# Patient Record
Sex: Female | Born: 1945 | Race: White | Hispanic: No | Marital: Married | Smoking: Never smoker
Health system: Southern US, Community
[De-identification: ages and names within clinical notes are randomized; demographics above are authoritative.]

## PROBLEM LIST (undated history)

## (undated) DIAGNOSIS — M48061 Spinal stenosis, lumbar region without neurogenic claudication: Secondary | ICD-10-CM

## (undated) DIAGNOSIS — S9000XA Contusion of unspecified ankle, initial encounter: Secondary | ICD-10-CM

## (undated) DIAGNOSIS — E785 Hyperlipidemia, unspecified: Secondary | ICD-10-CM

## (undated) DIAGNOSIS — T4145XA Adverse effect of unspecified anesthetic, initial encounter: Secondary | ICD-10-CM

## (undated) DIAGNOSIS — K7689 Other specified diseases of liver: Secondary | ICD-10-CM

## (undated) DIAGNOSIS — I10 Essential (primary) hypertension: Secondary | ICD-10-CM

## (undated) DIAGNOSIS — I340 Nonrheumatic mitral (valve) insufficiency: Secondary | ICD-10-CM

## (undated) DIAGNOSIS — E669 Obesity, unspecified: Secondary | ICD-10-CM

## (undated) DIAGNOSIS — R002 Palpitations: Secondary | ICD-10-CM

## (undated) DIAGNOSIS — N281 Cyst of kidney, acquired: Secondary | ICD-10-CM

## (undated) DIAGNOSIS — Z8719 Personal history of other diseases of the digestive system: Secondary | ICD-10-CM

## (undated) DIAGNOSIS — T8859XA Other complications of anesthesia, initial encounter: Secondary | ICD-10-CM

## (undated) DIAGNOSIS — I517 Cardiomegaly: Secondary | ICD-10-CM

## (undated) DIAGNOSIS — F419 Anxiety disorder, unspecified: Secondary | ICD-10-CM

## (undated) DIAGNOSIS — Z9889 Other specified postprocedural states: Secondary | ICD-10-CM

## (undated) DIAGNOSIS — R112 Nausea with vomiting, unspecified: Secondary | ICD-10-CM

## (undated) DIAGNOSIS — K219 Gastro-esophageal reflux disease without esophagitis: Secondary | ICD-10-CM

## (undated) HISTORY — DX: Essential (primary) hypertension: I10

## (undated) HISTORY — DX: Obesity, unspecified: E66.9

## (undated) HISTORY — PX: OTHER SURGICAL HISTORY: SHX169

## (undated) HISTORY — DX: Cardiomegaly: I51.7

## (undated) HISTORY — PX: CHOLECYSTECTOMY: SHX55

## (undated) HISTORY — DX: Gastro-esophageal reflux disease without esophagitis: K21.9

## (undated) HISTORY — PX: LIPOMA EXCISION: SHX5283

## (undated) HISTORY — DX: Palpitations: R00.2

## (undated) HISTORY — DX: Anxiety disorder, unspecified: F41.9

## (undated) HISTORY — DX: Contusion of unspecified ankle, initial encounter: S90.00XA

## (undated) HISTORY — DX: Nonrheumatic mitral (valve) insufficiency: I34.0

## (undated) HISTORY — PX: MENISCUS REPAIR: SHX5179

## (undated) HISTORY — DX: Hyperlipidemia, unspecified: E78.5

## (undated) HISTORY — DX: Other specified diseases of liver: K76.89

---

## 1993-10-19 HISTORY — PX: BREAST LUMPECTOMY: SHX2

## 1998-02-11 ENCOUNTER — Other Ambulatory Visit: Admission: RE | Admit: 1998-02-11 | Discharge: 1998-02-11 | Payer: Self-pay | Admitting: Obstetrics & Gynecology

## 1999-03-13 ENCOUNTER — Other Ambulatory Visit: Admission: RE | Admit: 1999-03-13 | Discharge: 1999-03-13 | Payer: Self-pay | Admitting: Obstetrics & Gynecology

## 2000-04-07 ENCOUNTER — Other Ambulatory Visit: Admission: RE | Admit: 2000-04-07 | Discharge: 2000-04-07 | Payer: Self-pay | Admitting: Obstetrics and Gynecology

## 2000-04-14 ENCOUNTER — Encounter: Payer: Self-pay | Admitting: Obstetrics and Gynecology

## 2000-04-14 ENCOUNTER — Encounter: Admission: RE | Admit: 2000-04-14 | Discharge: 2000-04-14 | Payer: Self-pay | Admitting: Obstetrics and Gynecology

## 2001-06-29 ENCOUNTER — Encounter: Admission: RE | Admit: 2001-06-29 | Discharge: 2001-06-29 | Payer: Self-pay | Admitting: Obstetrics and Gynecology

## 2001-06-29 ENCOUNTER — Encounter: Payer: Self-pay | Admitting: Obstetrics and Gynecology

## 2001-07-04 ENCOUNTER — Encounter: Admission: RE | Admit: 2001-07-04 | Discharge: 2001-07-04 | Payer: Self-pay | Admitting: Obstetrics and Gynecology

## 2001-07-04 ENCOUNTER — Encounter: Payer: Self-pay | Admitting: Obstetrics and Gynecology

## 2001-07-13 ENCOUNTER — Other Ambulatory Visit: Admission: RE | Admit: 2001-07-13 | Discharge: 2001-07-13 | Payer: Self-pay | Admitting: Obstetrics and Gynecology

## 2001-08-04 ENCOUNTER — Encounter (INDEPENDENT_AMBULATORY_CARE_PROVIDER_SITE_OTHER): Payer: Self-pay | Admitting: *Deleted

## 2001-08-04 ENCOUNTER — Ambulatory Visit (HOSPITAL_BASED_OUTPATIENT_CLINIC_OR_DEPARTMENT_OTHER): Admission: RE | Admit: 2001-08-04 | Discharge: 2001-08-04 | Payer: Self-pay | Admitting: Surgery

## 2002-08-02 ENCOUNTER — Other Ambulatory Visit: Admission: RE | Admit: 2002-08-02 | Discharge: 2002-08-02 | Payer: Self-pay | Admitting: Obstetrics and Gynecology

## 2002-10-10 ENCOUNTER — Encounter: Admission: RE | Admit: 2002-10-10 | Discharge: 2002-10-10 | Payer: Self-pay | Admitting: Obstetrics and Gynecology

## 2002-10-10 ENCOUNTER — Encounter: Payer: Self-pay | Admitting: Obstetrics and Gynecology

## 2003-10-03 ENCOUNTER — Other Ambulatory Visit: Admission: RE | Admit: 2003-10-03 | Discharge: 2003-10-03 | Payer: Self-pay | Admitting: Obstetrics and Gynecology

## 2003-10-24 ENCOUNTER — Encounter: Admission: RE | Admit: 2003-10-24 | Discharge: 2003-10-24 | Payer: Self-pay | Admitting: Obstetrics and Gynecology

## 2004-11-11 ENCOUNTER — Other Ambulatory Visit: Admission: RE | Admit: 2004-11-11 | Discharge: 2004-11-11 | Payer: Self-pay | Admitting: Obstetrics and Gynecology

## 2004-11-17 ENCOUNTER — Encounter: Admission: RE | Admit: 2004-11-17 | Discharge: 2004-11-17 | Payer: Self-pay | Admitting: Obstetrics and Gynecology

## 2005-10-19 HISTORY — PX: US ECHOCARDIOGRAPHY: HXRAD669

## 2005-11-12 ENCOUNTER — Other Ambulatory Visit: Admission: RE | Admit: 2005-11-12 | Discharge: 2005-11-12 | Payer: Self-pay | Admitting: Obstetrics and Gynecology

## 2006-01-16 ENCOUNTER — Ambulatory Visit (HOSPITAL_COMMUNITY): Admission: RE | Admit: 2006-01-16 | Discharge: 2006-01-16 | Payer: Self-pay | Admitting: Orthopedic Surgery

## 2006-02-08 ENCOUNTER — Encounter: Payer: Self-pay | Admitting: Vascular Surgery

## 2006-02-08 ENCOUNTER — Ambulatory Visit (HOSPITAL_COMMUNITY): Admission: RE | Admit: 2006-02-08 | Discharge: 2006-02-08 | Payer: Self-pay | Admitting: Cardiology

## 2006-03-25 ENCOUNTER — Encounter: Admission: RE | Admit: 2006-03-25 | Discharge: 2006-03-25 | Payer: Self-pay | Admitting: Cardiology

## 2006-04-01 ENCOUNTER — Encounter: Admission: RE | Admit: 2006-04-01 | Discharge: 2006-04-01 | Payer: Self-pay | Admitting: Gastroenterology

## 2006-11-24 ENCOUNTER — Encounter: Admission: RE | Admit: 2006-11-24 | Discharge: 2006-11-24 | Payer: Self-pay | Admitting: Obstetrics and Gynecology

## 2007-06-24 ENCOUNTER — Ambulatory Visit (HOSPITAL_COMMUNITY): Admission: RE | Admit: 2007-06-24 | Discharge: 2007-06-24 | Payer: Self-pay | Admitting: Gastroenterology

## 2007-12-05 ENCOUNTER — Encounter: Admission: RE | Admit: 2007-12-05 | Discharge: 2007-12-05 | Payer: Self-pay | Admitting: Obstetrics and Gynecology

## 2008-12-25 ENCOUNTER — Encounter: Admission: RE | Admit: 2008-12-25 | Discharge: 2008-12-25 | Payer: Self-pay | Admitting: Obstetrics and Gynecology

## 2009-12-30 ENCOUNTER — Encounter: Admission: RE | Admit: 2009-12-30 | Discharge: 2009-12-30 | Payer: Self-pay | Admitting: Obstetrics and Gynecology

## 2010-01-13 ENCOUNTER — Encounter: Admission: RE | Admit: 2010-01-13 | Discharge: 2010-01-13 | Payer: Self-pay | Admitting: Gastroenterology

## 2010-03-05 LAB — HM COLONOSCOPY

## 2010-06-09 ENCOUNTER — Ambulatory Visit: Payer: Self-pay | Admitting: Cardiology

## 2010-07-14 ENCOUNTER — Ambulatory Visit: Payer: Self-pay | Admitting: Cardiology

## 2010-12-04 ENCOUNTER — Other Ambulatory Visit: Payer: Self-pay | Admitting: Obstetrics and Gynecology

## 2010-12-04 DIAGNOSIS — Z1231 Encounter for screening mammogram for malignant neoplasm of breast: Secondary | ICD-10-CM

## 2010-12-18 ENCOUNTER — Other Ambulatory Visit: Payer: Self-pay | Admitting: Gastroenterology

## 2010-12-19 ENCOUNTER — Ambulatory Visit
Admission: RE | Admit: 2010-12-19 | Discharge: 2010-12-19 | Disposition: A | Discharge status: Home / Self Care | Payer: 59 | Source: Ambulatory Visit | Attending: Gastroenterology | Admitting: Gastroenterology

## 2011-01-07 ENCOUNTER — Ambulatory Visit
Admission: RE | Admit: 2011-01-07 | Discharge: 2011-01-07 | Disposition: A | Discharge status: Home / Self Care | Payer: 59 | Source: Ambulatory Visit | Attending: Obstetrics and Gynecology | Admitting: Obstetrics and Gynecology

## 2011-01-07 DIAGNOSIS — Z1231 Encounter for screening mammogram for malignant neoplasm of breast: Secondary | ICD-10-CM

## 2011-02-07 ENCOUNTER — Encounter: Payer: Self-pay | Admitting: Cardiology

## 2011-02-07 DIAGNOSIS — R002 Palpitations: Secondary | ICD-10-CM | POA: Insufficient documentation

## 2011-02-07 DIAGNOSIS — I517 Cardiomegaly: Secondary | ICD-10-CM | POA: Insufficient documentation

## 2011-02-07 DIAGNOSIS — E785 Hyperlipidemia, unspecified: Secondary | ICD-10-CM | POA: Insufficient documentation

## 2011-02-07 DIAGNOSIS — I10 Essential (primary) hypertension: Secondary | ICD-10-CM | POA: Insufficient documentation

## 2011-02-07 DIAGNOSIS — F419 Anxiety disorder, unspecified: Secondary | ICD-10-CM | POA: Insufficient documentation

## 2011-02-07 DIAGNOSIS — K219 Gastro-esophageal reflux disease without esophagitis: Secondary | ICD-10-CM | POA: Insufficient documentation

## 2011-02-07 DIAGNOSIS — S9000XA Contusion of unspecified ankle, initial encounter: Secondary | ICD-10-CM | POA: Insufficient documentation

## 2011-02-07 DIAGNOSIS — K7689 Other specified diseases of liver: Secondary | ICD-10-CM | POA: Insufficient documentation

## 2011-02-12 ENCOUNTER — Other Ambulatory Visit (HOSPITAL_COMMUNITY): Payer: Self-pay | Admitting: Surgery

## 2011-02-12 ENCOUNTER — Other Ambulatory Visit: Payer: Self-pay | Admitting: Surgery

## 2011-02-12 ENCOUNTER — Ambulatory Visit (HOSPITAL_COMMUNITY)
Admission: RE | Admit: 2011-02-12 | Discharge: 2011-02-12 | Disposition: A | Discharge status: Home / Self Care | Payer: 59 | Source: Ambulatory Visit | Attending: Surgery | Admitting: Surgery

## 2011-02-12 ENCOUNTER — Encounter: Payer: Self-pay | Admitting: Cardiology

## 2011-02-12 ENCOUNTER — Encounter (HOSPITAL_COMMUNITY): Payer: 59

## 2011-02-12 DIAGNOSIS — Z01812 Encounter for preprocedural laboratory examination: Secondary | ICD-10-CM | POA: Insufficient documentation

## 2011-02-12 DIAGNOSIS — K824 Cholesterolosis of gallbladder: Secondary | ICD-10-CM

## 2011-02-12 DIAGNOSIS — K449 Diaphragmatic hernia without obstruction or gangrene: Secondary | ICD-10-CM | POA: Insufficient documentation

## 2011-02-12 DIAGNOSIS — Z01810 Encounter for preprocedural cardiovascular examination: Secondary | ICD-10-CM | POA: Insufficient documentation

## 2011-02-12 DIAGNOSIS — I10 Essential (primary) hypertension: Secondary | ICD-10-CM | POA: Insufficient documentation

## 2011-02-12 DIAGNOSIS — Z01818 Encounter for other preprocedural examination: Secondary | ICD-10-CM | POA: Insufficient documentation

## 2011-02-12 DIAGNOSIS — Z79899 Other long term (current) drug therapy: Secondary | ICD-10-CM | POA: Insufficient documentation

## 2011-02-12 LAB — URINALYSIS, ROUTINE W REFLEX MICROSCOPIC
Bilirubin Urine: NEGATIVE
Ketones, ur: NEGATIVE mg/dL
Nitrite: NEGATIVE
Specific Gravity, Urine: 1.012 (ref 1.005–1.030)
Urobilinogen, UA: 0.2 mg/dL (ref 0.0–1.0)

## 2011-02-12 LAB — DIFFERENTIAL
Basophils Absolute: 0.1 10*3/uL (ref 0.0–0.1)
Basophils Relative: 1 % (ref 0–1)
Eosinophils Absolute: 0.1 10*3/uL (ref 0.0–0.7)
Eosinophils Relative: 1 % (ref 0–5)
Lymphs Abs: 1.9 10*3/uL (ref 0.7–4.0)
Neutrophils Relative %: 66 % (ref 43–77)

## 2011-02-12 LAB — COMPREHENSIVE METABOLIC PANEL
ALT: 16 U/L (ref 0–35)
AST: 25 U/L (ref 0–37)
Albumin: 3.9 g/dL (ref 3.5–5.2)
Alkaline Phosphatase: 66 U/L (ref 39–117)
CO2: 30 mEq/L (ref 19–32)
Chloride: 97 mEq/L (ref 96–112)
Creatinine, Ser: 0.72 mg/dL (ref 0.4–1.2)
GFR calc non Af Amer: >60 mL/min (ref >60)
Sodium: 134 mEq/L — ABNORMAL LOW (ref 135–145)
Total Bilirubin: 0.5 mg/dL (ref 0.3–1.2)

## 2011-02-12 LAB — CBC
MCV: 84.3 fL (ref 78.0–100.0)
Platelets: 342 10*3/uL (ref 150–400)
RDW: 12.1 % (ref 11.5–15.5)
WBC: 8 10*3/uL (ref 4.0–10.5)

## 2011-02-16 ENCOUNTER — Encounter: Payer: Self-pay | Admitting: Cardiology

## 2011-02-16 ENCOUNTER — Ambulatory Visit (INDEPENDENT_AMBULATORY_CARE_PROVIDER_SITE_OTHER): Payer: 59 | Admitting: Cardiology

## 2011-02-16 DIAGNOSIS — K219 Gastro-esophageal reflux disease without esophagitis: Secondary | ICD-10-CM

## 2011-02-16 DIAGNOSIS — E785 Hyperlipidemia, unspecified: Secondary | ICD-10-CM

## 2011-02-16 DIAGNOSIS — I10 Essential (primary) hypertension: Secondary | ICD-10-CM

## 2011-02-16 MED ORDER — HYDROCHLOROTHIAZIDE 25 MG PO TABS: 25.0000 mg | ORAL_TABLET | Freq: Every day | ORAL | Status: DC

## 2011-02-16 MED ORDER — PRAVASTATIN SODIUM 20 MG PO TABS: 20.0000 mg | ORAL_TABLET | Freq: Every day | ORAL | Status: DC

## 2011-02-16 MED ORDER — PANTOPRAZOLE SODIUM 40 MG PO TBEC: 40.0000 mg | DELAYED_RELEASE_TABLET | Freq: Every day | ORAL | Status: DC

## 2011-02-16 NOTE — Progress Notes (Signed)
Subjective:   Carla Lewis seen today for followup visit as well as preop visit. She is planning a cholecystectomy in the morning because of multiple gallbladder polyps. In general, she's been doing well in followup of her hypertension and hyperlipidemia. We discussed my retirement in her efforts with preventive cardiology. I will defer her to primary care otherwise. Blood pressure readings at home are satisfactory. She has lost about 9 pounds of weight since her last visit. Primary care is with Dr. Felipa Eth.  Current Outpatient Prescriptions  Medication Sig Dispense Refill  . cetirizine (ZYRTEC) 10 MG tablet Take 10 mg by mouth daily.        . Cholecalciferol (VITAMIN D) 1000 UNITS capsule Take 1,000 Units by mouth daily.        . Coenzyme Q10 (COQ10) 100 MG CAPS Take by mouth daily.        . Glucosamine-Chondroitin (MOVE FREE PO) Take by mouth daily.        . hydrochlorothiazide 25 MG tablet Take 1 tablet (25 mg total) by mouth daily.  30 tablet  11  . Multiple Vitamin (MULTIVITAMIN) tablet Take 1 tablet by mouth daily.        . pantoprazole (PROTONIX) 40 MG tablet Take 1 tablet (40 mg total) by mouth daily.  30 tablet  11  . pravastatin (PRAVACHOL) 20 MG tablet Take 1 tablet (20 mg total) by mouth daily.  30 tablet  11  . Wheat Dextrin (BENEFIBER PO) Take by mouth daily. 2 DAILY        . DISCONTD: hydrochlorothiazide 25 MG tablet Take 25 mg by mouth daily.        Marland Kitchen DISCONTD: pantoprazole (PROTONIX) 40 MG tablet Take 40 mg by mouth daily.        Marland Kitchen DISCONTD: pravastatin (PRAVACHOL) 20 MG tablet Take 20 mg by mouth daily.        Marland Kitchen aspirin 81 MG tablet Take 81 mg by mouth daily.        . fish oil-omega-3 fatty acids 1000 MG capsule Take by mouth daily.          Allergies  Allergen Reactions  . Codeine   . Sulfa Drugs Cross Reactors     Patient Active Problem List  Diagnoses  . Obesity  . GERD (gastroesophageal reflux disease)  . Palpitations  . Anxiety  . Hypertension  . Ankle bruise  .  Hyperlipidemia  . Liver cyst  . LVH (left ventricular hypertrophy)    History  Smoking status  . Never Smoker   Smokeless tobacco  . Not on file    History  Alcohol Use No    Family History  Problem Relation Age of Onset  . Heart disease Father 49  . Hypertension Father 80    Review of Systems:   The patient denies any heat or cold intolerance.  No weight gain or weight loss.  The patient denies headaches or blurry vision.  There is no cough or sputum production.  The patient denies dizziness.  There is no hematuria or hematochezia.  The patient denies any muscle aches or arthritis.  The patient denies any rash.  The patient denies frequent falling or instability.  There is no history of depression or anxiety.  All other systems were reviewed and are negative.   Physical Exam:   Weight is 183. Blood pressure 142/80. Heart rate is 70.The head is normocephalic and atraumatic.  Pupils are equally round and reactive to light.  Sclerae nonicteric.  Conjunctiva is  clear.  Oropharynx is unremarkable.  There's adequate oral airway.  Neck is supple there are no masses.  Thyroid is not enlarged.  There is no lymphadenopathy.  Lungs are clear.  Chest is symmetric.  Heart shows a regular rate and rhythm.  S1 and S2 are normal.  There is no murmur click or gallop.  Abdomen is soft normal bowel sounds.  There is no organomegaly.  Genital and rectal deferred.  Extremities are without edema.  Peripheral pulses are adequate.  Neurologically intact.  Full range of motion.  The patient is not depressed.  Skin is warm and dry.  Assessment / Plan:

## 2011-02-17 ENCOUNTER — Ambulatory Visit (HOSPITAL_COMMUNITY)
Admission: RE | Admit: 2011-02-17 | Discharge: 2011-02-17 | Disposition: A | Discharge status: Home / Self Care | Payer: 59 | Source: Ambulatory Visit | Attending: Surgery | Admitting: Surgery

## 2011-02-17 ENCOUNTER — Ambulatory Visit (HOSPITAL_COMMUNITY): Payer: 59

## 2011-02-17 ENCOUNTER — Other Ambulatory Visit: Payer: Self-pay | Admitting: Surgery

## 2011-02-17 DIAGNOSIS — I10 Essential (primary) hypertension: Secondary | ICD-10-CM | POA: Insufficient documentation

## 2011-02-17 DIAGNOSIS — D1779 Benign lipomatous neoplasm of other sites: Secondary | ICD-10-CM | POA: Insufficient documentation

## 2011-02-17 DIAGNOSIS — K811 Chronic cholecystitis: Secondary | ICD-10-CM | POA: Insufficient documentation

## 2011-02-17 DIAGNOSIS — K824 Cholesterolosis of gallbladder: Secondary | ICD-10-CM | POA: Insufficient documentation

## 2011-02-17 DIAGNOSIS — F411 Generalized anxiety disorder: Secondary | ICD-10-CM | POA: Insufficient documentation

## 2011-02-17 NOTE — Assessment & Plan Note (Signed)
She has satisfactory outpatient readings. We'll continue her low-dose hydrochlorothiazide.

## 2011-02-17 NOTE — Assessment & Plan Note (Signed)
We'll continue pravastatin 20 mg a day. I encouraged her to continue to try to lose weight. Followup lab work and followup care per Dr. Felipa Eth.

## 2011-02-18 NOTE — Op Note (Signed)
Carla Lewis, Carla Lewis          ACCOUNT NO.:  [id number]  MEDICAL RECORD NO.:  [id number]           PATIENT TYPE:  O  LOCATION:  DAYL                         FACILITY:  [facility]  PHYSICIAN:  Currie Paris, M.D.DATE OF BIRTH:  [birth year]  DATE OF PROCEDURE:  02/17/2011 DATE OF DISCHARGE:                              OPERATIVE REPORT   PREOPERATIVE DIAGNOSES: 1. Gallbladder polyps. 2. Lipoma right shoulder.  POSTOPERATIVE DIAGNOSES: 1. Gallbladder polyps. 2. Lipoma right shoulder.  PROCEDURE:  Laparoscopic cholecystectomy with operative cholangiogram and excision of lipoma of right shoulder area.  SURGEON:  Currie Paris, M.D.  ASSISTANT:  Anselm Pancoast. Zachery Dakins, M.D.  ANESTHESIA:  General endotracheal.  CLINICAL HISTORY:  This lady has presented with some biliary-type symptoms and polyps in her gallbladder, which actually have increased over a period of followup.  She has had a history of multiple lipomas and she has felt a new one on the right top of her shoulder that she wished to have excised.  DESCRIPTION OF PROCEDURE:  I saw the patient in the holding area and we confirmed the plans for the surgery as noted above.  I identified the lipoma, made a circle around it and initialed that for identification purposes.  The patient was taken to the operating room and after satisfactory general endotracheal anesthesia had been obtained, the abdomen was prepped and draped.  The time-out was done.  I injected 0.25% plain Marcaine at the umbilicus.  I made an incision and identified and opened the fascia.  The peritoneal cavity was entered under direct vision.  A pursestring was placed and the Hasson introduced and the abdomen was insufflated to 15.  There were no obvious abnormalities noted.  Under direct vision, a 10/11 trocar was placed in the epigastrium and two 5s laterally.  The gallbladder was retracted over the liver.  Using cautery and blunt  dissection, I opened a window around the cystic duct and the triangle of Calot and identified a long segment of cystic duct and a long segment of the anterior branch of the cystic artery and had a nice window behind them achieving a "critical view ".  I put a clip on the artery and one on the duct near the gallbladder.  A Cook catheter was introduced percutaneously and threaded into the cystic duct and operative cholangiography done, which showed filling of the pancreatic ducts with good filling of the common ducts and hepatic radicals.  No evidence of stones, obstruction, et Karie Soda.  It confirmed a long cystic duct segment.  The catheter was removed and three clips were placed on the stay side of the cystic duct and it was divided.  Two additional clips were placed on the artery and it was divided leaving two clips behind.  With a little bit of retraction I was able to identify the posterior branch of the artery and first clipped the branch of it and then the main posterior artery as it was coming up into the gallbladder and divided that.  The gallbladder was removed from below to above and placed in a bag.  I irrigated and made sure everything was dry.  The gallbladder  was extracted through the umbilical port.  Collateral ports were removed under direct vision.  A final check was made and everything appeared to be dry.  The pursestring was used to close the umbilical site, using the camera to make sure that we did not trap any bowel.  The abdomen was deflated through the epigastric port.  The skin was closed with 4-0 Monocryl subcuticular plus Dermabond.  At this time the drapes were changed, instruments, et Karie Soda and the patient's shoulder was rotated up so I could identify the area of the lipoma.  This was prepped and draped.  Another time-out was done.  I made an incision directly over this and identified just through the subcutaneous tissue a fatty mass, which was excised  using a combination of blunt and cautery dissection.  It appeared to actually be coming somewhat from underneath the deltoid muscle.  However, it seemed to come out intact and there did not appear to be any other lipomatous-type material around.  I put some Marcaine in here and closed in layers with 3-0 Vicryl, 4-0 Monocryl subcuticular and Dermabond.  The patient tolerated the procedure well.  There no complications.  All counts were correct.     Currie Paris, M.D.     CJS/MEDQ  D:  02/17/2011  T:  02/17/2011  Job:  [id number]  cc:   Larina Earthly, M.D. Fax: [phone]  JXBJYN WGN FAOZ, MD, [facility] Fax: [phone]  Electronically Signed by Cyndia Bent M.D. on 02/18/2011 07:49:33 AM

## 2011-03-06 ENCOUNTER — Encounter (INDEPENDENT_AMBULATORY_CARE_PROVIDER_SITE_OTHER): Payer: Self-pay | Admitting: Surgery

## 2011-03-06 NOTE — Op Note (Signed)
[place]  Patient:    Carla Lewis, Carla Lewis Visit Number: [id number] MRN: [id number]          Service Type: DSU Location: [facility] Attending Physician:  Charlton Haws Dictated by:   Currie Paris, M.D. Proc. Date: 08/04/01 Admit Date:  08/04/2001   CC:         Burnell Blanks, M.D.   Operative Report  CCS# [id number]  PREOPERATIVE DIAGNOSIS: 1. Lipoma of right forearm. 2. Forearm and left shoulder (deltoid area).  POSTOPERATIVE DIAGNOSIS: 1. Lipoma of right forearm. 2. Forearm and left shoulder (deltoid area).  OPERATION PERFORMED:  Excision of lipoma x 2.  SURGEON:  Currie Paris, M.D.  ANESTHESIA:  MAC.  INDICATIONS FOR PROCEDURE:  The patient is a 65 year old lady who has presented with several lipomas, two of which were symptomatic, one on the right forearm and the other on the left shoulder.  DESCRIPTION OF PROCEDURE:  The patient was seen in the holding area and the two lipomas were identified and marked.  She was taken into the operating room and given IV sedation.  The right forearm lipoma was approached first and the area was prepped and draped.  A combination of 1% Xylocaine and 0.5% Marcaine mixed equally was used for local and infiltrated around over the skin and underneath the mass.  A longitudinal incision was made the subcutaneous tissues were divided.  A well-circumscribed lobular fatty mass was identified and easily retrieved from subcutaneous tissues and it did go down to muscle fascia.  It appeared to come out intact and measured approximately 3 cm.  This incision was closed with some 3-0 Vicryl followed by 4-0 Monocryl subcuticular and Steri-Strips.  The left left deltoid lesion was also approached in similar fashion with local and again came out intact.  This measured about 7 cm in size and was somewhat lobulated but appeared to come out intact.  Likewise this was closed with Vicryl and Monocryl.   The patient tolerated the procedure well.  There were no operative complications. All counts were correct. Dictated by:   Currie Paris, M.D. Attending Physician:  Charlton Haws DD:  08/04/01 TD:  08/04/01 Job: [id number]

## 2011-09-07 ENCOUNTER — Encounter: Payer: Self-pay | Admitting: Cardiology

## 2011-09-07 ENCOUNTER — Ambulatory Visit (INDEPENDENT_AMBULATORY_CARE_PROVIDER_SITE_OTHER): Payer: Medicare Other | Admitting: Cardiology

## 2011-09-07 VITALS — BP 174/86 | HR 58 | Ht 69.5 in | Wt 179.0 lb

## 2011-09-07 DIAGNOSIS — E785 Hyperlipidemia, unspecified: Secondary | ICD-10-CM

## 2011-09-07 DIAGNOSIS — I10 Essential (primary) hypertension: Secondary | ICD-10-CM

## 2011-09-07 MED ORDER — LISINOPRIL-HYDROCHLOROTHIAZIDE 10-12.5 MG PO TABS: 1.0000 | ORAL_TABLET | Freq: Every day | ORAL | Status: DC

## 2011-09-07 NOTE — Patient Instructions (Signed)
Start Lisinopril HCT 10/12.5 mg daily.  Take 1/2 of your bystolic for 3 days then discontinue.  I will have you see Lawson Fiscal in 2 weeks for follow up. We will check blood work then.

## 2011-09-07 NOTE — Assessment & Plan Note (Signed)
Blood pressure has increased this fall. It is improved but she is still having occasional increased readings. She reports intolerance of bystolic with fatigue and some palpitations. Previous intolerance to Diovan because of anxiety. I will switch her to lisinopril HCT 10/12.5 mg daily. We will taper off her bystolic. I will have her return in 2 weeks and we will check a CBC and basic metabolic panel at that time. She is to keep a diary of her blood pressure readings. She is to avoid sodium intake. She understands that treating her blood pressure is somewhat of a trial and error process but we can hopefully find something that will treat her blood pressure satisfactorily without causing significant side effects.

## 2011-09-07 NOTE — Progress Notes (Signed)
Subjective:   Carla Lewis seen today for followup to establish cardiac care. She is a former patient of Dr. Deborah Chalk. She has a history of hypertension, hyperlipidemia, and palpitations. She reports that in September she worked very hard in her yard for 2 days. She feels that she got dehydrated. Her blood pressure spiked up to 180/108. She saw her primary care physician who started on Diovan. Within 2 weeks she was experiencing severe anxiety. She went off of the Diovan and her anxiety resolved. She was placed on bystolic. Since then her blood pressure has improved but she still has occasional high reading in the morning. She also complains of this medicine makes her feel lightheaded and that she has no energy. She is interested in trying a different medication. She previously was on HCTZ and tolerated this well. She has had prior cardiac evaluation with an echocardiogram in 2007 which showed LVH but normal systolic function. A stress thallium in 2010 was normal.  Current Outpatient Prescriptions  Medication Sig Dispense Refill  . ALPRAZolam (XANAX) 0.25 MG tablet Take 0.25 mg by mouth as needed.        Marland Kitchen aspirin 81 MG tablet Take 81 mg by mouth daily.        . cetirizine (ZYRTEC) 10 MG tablet Take 10 mg by mouth daily.        . Cholecalciferol (VITAMIN D) 1000 UNITS capsule Take 1,000 Units by mouth daily.        . Coenzyme Q10 (COQ10) 100 MG CAPS Take by mouth daily.        . fish oil-omega-3 fatty acids 1000 MG capsule Take by mouth daily.        . Glucosamine-Chondroitin (MOVE FREE PO) Take by mouth daily.        . Multiple Vitamin (MULTIVITAMIN) tablet Take 1 tablet by mouth daily.        . nebivolol (BYSTOLIC) 5 MG tablet Take 5 mg by mouth daily.        . pantoprazole (PROTONIX) 40 MG tablet Take 1 tablet (40 mg total) by mouth daily.  30 tablet  11  . pravastatin (PRAVACHOL) 20 MG tablet Take 1 tablet (20 mg total) by mouth daily.  30 tablet  11  . Wheat Dextrin (BENEFIBER PO) Take by mouth daily. 2  DAILY        . lisinopril-hydrochlorothiazide (PRINZIDE,ZESTORETIC) 10-12.5 MG per tablet Take 1 tablet by mouth daily.  30 tablet  11    Allergies  Allergen Reactions  . Codeine   . Sulfa Drugs Cross Reactors     Patient Active Problem List  Diagnoses  . Obesity  . GERD (gastroesophageal reflux disease)  . Palpitations  . Anxiety  . Hypertension  . Ankle bruise  . Hyperlipidemia  . Liver cyst  . LVH (left ventricular hypertrophy)    History  Smoking status  . Never Smoker   Smokeless tobacco  . Not on file    History  Alcohol Use No    Family History  Problem Relation Age of Onset  . Heart disease Father 63  . Hypertension Father 4    Review of Systems:   As noted in history of present illness  All other systems were reviewed and are negative.   Physical Exam:  BP 174/86  Pulse 58  Ht 5' 9.5" (1.765 m)  Wt 179 lb (81.194 kg)  BMI 26.05 kg/m2  Weight is 183. The head is normocephalic and atraumatic.  Pupils are equally round and reactive  to light.  Sclerae nonicteric.  Conjunctiva is clear.  Oropharynx is unremarkable.  There's adequate oral airway.  Neck is supple there are no masses.  Thyroid is not enlarged.  There is no lymphadenopathy.  Lungs are clear.  Chest is symmetric.  Heart shows a regular rate and rhythm.  S1 and S2 are normal.  There is no murmur click or gallop.  Abdomen is soft normal bowel sounds.  There is no organomegaly. There are no bruits. Genital and rectal deferred.  Extremities are without edema.  Peripheral pulses are adequate.  Neurologically intact.  Full range of motion.  The patient is not depressed.  Skin is warm and dry.  Laboratory data: Basic metabolic panel on 07/13/2011 was normal.  Assessment / Plan:

## 2011-09-21 ENCOUNTER — Encounter: Payer: Self-pay | Admitting: Nurse Practitioner

## 2011-09-21 ENCOUNTER — Ambulatory Visit (INDEPENDENT_AMBULATORY_CARE_PROVIDER_SITE_OTHER): Payer: Medicare Other | Admitting: Nurse Practitioner

## 2011-09-21 ENCOUNTER — Telehealth: Payer: Self-pay | Admitting: *Deleted

## 2011-09-21 VITALS — BP 128/78 | HR 74 | Ht 69.0 in | Wt 179.1 lb

## 2011-09-21 DIAGNOSIS — E669 Obesity, unspecified: Secondary | ICD-10-CM

## 2011-09-21 DIAGNOSIS — I10 Essential (primary) hypertension: Secondary | ICD-10-CM

## 2011-09-21 DIAGNOSIS — I517 Cardiomegaly: Secondary | ICD-10-CM

## 2011-09-21 DIAGNOSIS — R002 Palpitations: Secondary | ICD-10-CM

## 2011-09-21 LAB — CBC WITH DIFFERENTIAL/PLATELET
Basophils Absolute: 0 10*3/uL (ref 0.0–0.1)
Basophils Relative: 0.3 % (ref 0.0–3.0)
Eosinophils Absolute: 0.1 10*3/uL (ref 0.0–0.7)
Eosinophils Relative: 0.9 % (ref 0.0–5.0)
HCT: 41.9 % (ref 36.0–46.0)
Hemoglobin: 14.3 g/dL (ref 12.0–15.0)
Lymphocytes Relative: 31.7 % (ref 12.0–46.0)
Lymphs Abs: 1.8 10*3/uL (ref 0.7–4.0)
MCHC: 34.1 g/dL (ref 30.0–36.0)
MCV: 89.6 fl (ref 78.0–100.0)
Monocytes Absolute: 0.5 10*3/uL (ref 0.1–1.0)
Monocytes Relative: 9 % (ref 3.0–12.0)
Neutro Abs: 3.3 10*3/uL (ref 1.4–7.7)
Neutrophils Relative %: 58.1 % (ref 43.0–77.0)
Platelets: 282 10*3/uL (ref 150.0–400.0)
RBC: 4.68 Mil/uL (ref 3.87–5.11)
RDW: 13 % (ref 11.5–14.6)
WBC: 5.6 10*3/uL (ref 4.5–10.5)

## 2011-09-21 LAB — BASIC METABOLIC PANEL
BUN: 15 mg/dL (ref 6–23)
CO2: 29 mEq/L (ref 19–32)
Calcium: 9.3 mg/dL (ref 8.4–10.5)
Chloride: 101 mEq/L (ref 96–112)
Creatinine, Ser: 0.8 mg/dL (ref 0.4–1.2)
GFR: 77.53 mL/min (ref >60.00)
Glucose, Bld: 86 mg/dL (ref 70–99)
Potassium: 4.6 mEq/L (ref 3.5–5.1)
Sodium: 137 mEq/L (ref 135–145)

## 2011-09-21 NOTE — Telephone Encounter (Signed)
ADVISED PATIENT OF LAB RESULTS

## 2011-09-21 NOTE — Telephone Encounter (Signed)
Message copied by Royanne Foots on Mon Sep 21, 2011  4:58 PM ------      Message from: Rosalio Macadamia      Created: Mon Sep 21, 2011  3:43 PM       Ok to report. Labs are satisfactory. Patient of Dr. Elvis Coil.

## 2011-09-21 NOTE — Assessment & Plan Note (Signed)
Currently no symptoms.   

## 2011-09-21 NOTE — Assessment & Plan Note (Addendum)
Blood pressure is controlled. Her readings at home are very good. She feels good on her current regimen with the Lisinopril HCT. We will check her labs today. I will see her back in 4 months. She will continue to monitor. I have asked her to get back on track with her diet/weight loss/exercise plan. Patient is agreeable to this plan and will call if any problems develop in the interim.

## 2011-09-21 NOTE — Assessment & Plan Note (Signed)
Needs to get back on track with her diet/weight loss/exercise plan.

## 2011-09-21 NOTE — Progress Notes (Signed)
Carla Lewis Date of Birth: 08/02/1946 Medical Record #[id number]  History of Present Illness: Carla Lewis is seen back today for a 2 week check. She is seen for Dr. [place]. She has had a history of HTN and has LVH per echo. She has had recent increase in her blood pressure and had medicines restarted. She was intolerant to Diovan (due to anxiety) and Bystolic (due to fatigue and lightheadedness). Lisinopril/HCT was started by Dr. [place] 2 weeks ago.    She comes in today. She is doing very well. Blood pressure is great at home. She feels good on her medicines. No chest pain. Not short of breath. Weight is up a little. She knows that she needs to get back on track with diet/weight loss/exercise.    Current Outpatient Prescriptions on File Prior to Visit  Medication Sig Dispense Refill  . ALPRAZolam (XANAX) 0.25 MG tablet Take 0.25 mg by mouth as needed.        Marland Kitchen aspirin 81 MG tablet Take 81 mg by mouth daily.        . cetirizine (ZYRTEC) 10 MG tablet Take 10 mg by mouth daily.        . Cholecalciferol (VITAMIN D) 1000 UNITS capsule Take 1,000 Units by mouth daily.        . Coenzyme Q10 (COQ10) 100 MG CAPS Take by mouth daily.        . Glucosamine-Chondroitin (MOVE FREE PO) Take by mouth daily.        Marland Kitchen lisinopril-hydrochlorothiazide (PRINZIDE,ZESTORETIC) 10-12.5 MG per tablet Take 1 tablet by mouth daily.  30 tablet  11  . Multiple Vitamin (MULTIVITAMIN) tablet Take 1 tablet by mouth daily.        . pantoprazole (PROTONIX) 40 MG tablet Take 1 tablet (40 mg total) by mouth daily.  30 tablet  11  . pravastatin (PRAVACHOL) 20 MG tablet Take 1 tablet (20 mg total) by mouth daily.  30 tablet  11  . Wheat Dextrin (BENEFIBER PO) Take by mouth daily. 2 DAILY          Allergies  Allergen Reactions  . Bystolic (Nebivolol Hcl)     No energy and lightheadedness.  . Codeine   . Diovan (Valsartan)     Anxiety  . Sulfa Drugs Cross Reactors     Past Medical History  Diagnosis Date  .  Obesity   . GERD (gastroesophageal reflux disease)   . Palpitations   . Anxiety   . Hypertension   . Ankle bruise     SPONTANEOUS BRUISE ON RIGHT ANKLE  . Hyperlipidemia   . Liver cyst   . LVH (left ventricular hypertrophy)     MILD LVH per echo in 2007  . Mitral valve regurgitation     Past Surgical History  Procedure Date  . Breast lumpectomy 1995    RIGHT BREAST  . US echocardiography 2007    SHOWED EF OF 55-60%  . Cholecystectomy     History  Smoking status  . Never Smoker   Smokeless tobacco  . Not on file    History  Alcohol Use No    Family History  Problem Relation Age of Onset  . Heart disease Father 51  . Hypertension Father 17    Review of Systems: The review of systems is per the HPI.  All other systems were reviewed and are negative.  Physical Exam: BP 128/78  Pulse 74  Ht 5\' 9"  (1.753 m)  Wt 179 lb 1.9 oz (81.248  kg)  BMI 26.45 kg/m2 Patient is very pleasant and in no acute distress. Skin is warm and dry. Color is normal.  HEENT is unremarkable. Normocephalic/atraumatic. PERRL. Sclera are nonicteric. Neck is supple. No masses. No JVD. Lungs are clear. Cardiac exam shows a regular rate and rhythm. Abdomen is soft. Extremities are without edema. Gait and ROM are intact. No gross neurologic deficits noted.   LABORATORY DATA: BMET and CBC are pending.    Assessment / Plan:

## 2011-09-21 NOTE — Patient Instructions (Signed)
Stay on your current medicines.  Get back on track with diet and weight loss and exercise.  I want to see you in 4 months.  Monitor your blood pressure at home.   Record your readings and bring to your next visit.  Limit sodium intake.  Call the [facility] office at [phone] if you have any questions, problems or concerns.

## 2011-09-21 NOTE — Assessment & Plan Note (Signed)
Mild per last echo in 2007. She currently has good blood pressure control.

## 2011-10-26 ENCOUNTER — Other Ambulatory Visit: Payer: Self-pay | Admitting: Dermatology

## 2011-10-26 DIAGNOSIS — D239 Other benign neoplasm of skin, unspecified: Secondary | ICD-10-CM | POA: Diagnosis not present

## 2011-10-26 DIAGNOSIS — L819 Disorder of pigmentation, unspecified: Secondary | ICD-10-CM | POA: Diagnosis not present

## 2011-10-26 DIAGNOSIS — D485 Neoplasm of uncertain behavior of skin: Secondary | ICD-10-CM | POA: Diagnosis not present

## 2011-10-26 DIAGNOSIS — C44611 Basal cell carcinoma of skin of unspecified upper limb, including shoulder: Secondary | ICD-10-CM | POA: Diagnosis not present

## 2011-10-26 DIAGNOSIS — L821 Other seborrheic keratosis: Secondary | ICD-10-CM | POA: Diagnosis not present

## 2011-11-04 ENCOUNTER — Encounter: Payer: Self-pay | Admitting: Nurse Practitioner

## 2011-11-04 DIAGNOSIS — I10 Essential (primary) hypertension: Secondary | ICD-10-CM | POA: Diagnosis not present

## 2011-11-04 DIAGNOSIS — M949 Disorder of cartilage, unspecified: Secondary | ICD-10-CM | POA: Diagnosis not present

## 2011-11-04 DIAGNOSIS — R82998 Other abnormal findings in urine: Secondary | ICD-10-CM | POA: Diagnosis not present

## 2011-11-04 DIAGNOSIS — E785 Hyperlipidemia, unspecified: Secondary | ICD-10-CM | POA: Diagnosis not present

## 2011-11-04 DIAGNOSIS — M899 Disorder of bone, unspecified: Secondary | ICD-10-CM | POA: Diagnosis not present

## 2011-11-09 ENCOUNTER — Ambulatory Visit (INDEPENDENT_AMBULATORY_CARE_PROVIDER_SITE_OTHER): Payer: Medicare Other | Admitting: Nurse Practitioner

## 2011-11-09 ENCOUNTER — Encounter: Payer: Self-pay | Admitting: Nurse Practitioner

## 2011-11-09 VITALS — BP 146/92 | HR 76 | Ht 69.0 in | Wt 177.6 lb

## 2011-11-09 DIAGNOSIS — F411 Generalized anxiety disorder: Secondary | ICD-10-CM

## 2011-11-09 DIAGNOSIS — I10 Essential (primary) hypertension: Secondary | ICD-10-CM

## 2011-11-09 DIAGNOSIS — F419 Anxiety disorder, unspecified: Secondary | ICD-10-CM

## 2011-11-09 DIAGNOSIS — R002 Palpitations: Secondary | ICD-10-CM | POA: Diagnosis not present

## 2011-11-09 MED ORDER — SERTRALINE HCL 50 MG PO TABS: ORAL_TABLET | ORAL | Status: DC

## 2011-11-09 NOTE — Patient Instructions (Signed)
I want you to try Zoloft 50 mg. Start just 1/2 tablet daily for the next 2 weeks and then to a whole tablet daily.  I will see you in a month.  Exercise every day.  Call the [facility] office at [phone] if you have any questions, problems or concerns.

## 2011-11-09 NOTE — Assessment & Plan Note (Signed)
Has known PVC's. I think she is depressed. She is willing to try some low dose Zoloft. I will see her back in a month.

## 2011-11-09 NOTE — Progress Notes (Signed)
Carla Lewis Date of Birth: 1945-11-03 Medical Record #[id number]  History of Present Illness: Carla Lewis is seen today for a work in visit. She is seen for Dr. Swaziland. She has a history of HTN with LVH. Her LisinoprilHct was increased late last year for better blood pressure control. She is intolerant to Bystolic (fatigue & lightheadedness) and Diovan (anxiety). When I saw her last month she was doing well.   She comes in today with a complaint of palpitations. She is sad and seems depressed. Blood pressure is trending back up. She is tearful. She is not exercising. Has no energy. Her husband is here with her today and thinks she is depressed as well. Dr. Felipa Eth did try to start her on some type of antidepressant back in November that she did not tolerate. She does not know the name but says she was given samples.   No chest pain or shortness of breath.  Current Outpatient Prescriptions on File Prior to Visit  Medication Sig Dispense Refill  . ALPRAZolam (XANAX) 0.25 MG tablet Take 0.25 mg by mouth as needed.        Marland Kitchen aspirin 81 MG tablet Take 81 mg by mouth daily.        . cetirizine (ZYRTEC) 10 MG tablet Take 10 mg by mouth daily.        . Cholecalciferol (VITAMIN D) 1000 UNITS capsule Take 1,000 Units by mouth daily.        . Coenzyme Q10 (COQ10) 100 MG CAPS Take by mouth daily.        . Glucosamine-Chondroitin (MOVE FREE PO) Take by mouth daily.        Marland Kitchen lisinopril-hydrochlorothiazide (PRINZIDE,ZESTORETIC) 10-12.5 MG per tablet Take 1 tablet by mouth daily.  30 tablet  11  . Multiple Vitamin (MULTIVITAMIN) tablet Take 1 tablet by mouth daily.        . pantoprazole (PROTONIX) 40 MG tablet Take 1 tablet (40 mg total) by mouth daily.  30 tablet  11  . pravastatin (PRAVACHOL) 20 MG tablet Take 1 tablet (20 mg total) by mouth daily.  30 tablet  11  . Wheat Dextrin (BENEFIBER PO) Take by mouth daily. 2 DAILY          Allergies  Allergen Reactions  . Bystolic (Nebivolol Hcl)     No  energy and lightheadedness.  . Codeine   . Diovan (Valsartan)     Anxiety  . Sulfa Drugs Cross Reactors     Past Medical History  Diagnosis Date  . Obesity   . GERD (gastroesophageal reflux disease)   . Palpitations   . Anxiety   . Hypertension   . Ankle bruise     SPONTANEOUS BRUISE ON RIGHT ANKLE  . Hyperlipidemia   . Liver cyst   . LVH (left ventricular hypertrophy)     MILD LVH per echo in 2007  . Mitral valve regurgitation     Past Surgical History  Procedure Date  . Breast lumpectomy 1995    RIGHT BREAST  . US echocardiography 2007    SHOWED EF OF 55-60%  . Cholecystectomy     History  Smoking status  . Never Smoker   Smokeless tobacco  . Not on file    History  Alcohol Use No    Family History  Problem Relation Age of Onset  . Heart disease Father 57  . Hypertension Father 7    Review of Systems: The review of systems is per the HPI.  All  other systems were reviewed and are negative.  Physical Exam: Ht 5\' 9"  (1.753 m)  Wt 177 lb 9.6 oz (80.559 kg)  BMI 26.23 kg/m2 Patient is very pleasant and in no acute distress. She is anxious which is chronic. Today she seems more depressed and is visibly tearful. Skin is warm and dry. Color is normal.  HEENT is unremarkable. Normocephalic/atraumatic. PERRL. Sclera are nonicteric. Neck is supple. No masses. No JVD. Lungs are clear. Cardiac exam shows a regular rate and rhythm. Abdomen is soft. Extremities are without edema. Gait and ROM are intact. No gross neurologic deficits noted.   LABORATORY DATA: EKG today shows sinus with PVCs.  No acute changes.   Assessment / Plan:

## 2011-11-09 NOTE — Assessment & Plan Note (Signed)
She appears more depressed to me. Zoloft is started. I will see her back in a month.

## 2011-11-09 NOTE — Assessment & Plan Note (Signed)
Blood pressure has trended up over the last 2 weeks. No medicines today yet. I think this is more related to her emotional state.

## 2011-11-11 ENCOUNTER — Telehealth: Payer: Self-pay | Admitting: Cardiology

## 2011-11-11 NOTE — Telephone Encounter (Signed)
New Msg: pt calling wanting to speak to [facility] regarding EKG ran Monday. Pt was given zoloft for anxiety-as a result pt is not sleeping and wants to know what she can do to help her sleep. Pt said on average she is getting 3-4 hours of sleep. Last night pt took 2 advil PM and still only got 4 hours of sleep. Please return pt call to discuss further.

## 2011-11-11 NOTE — Telephone Encounter (Signed)
Patient called stated she wanted medication to help her sleep.States she is not sleeping at night.Spoke with Norma Fredrickson NP she advised to take zoloft in am and alprazolam at night.States she will and she is going to try melatoin.

## 2011-11-12 DIAGNOSIS — C44611 Basal cell carcinoma of skin of unspecified upper limb, including shoulder: Secondary | ICD-10-CM | POA: Diagnosis not present

## 2011-12-02 DIAGNOSIS — Z1212 Encounter for screening for malignant neoplasm of rectum: Secondary | ICD-10-CM | POA: Diagnosis not present

## 2011-12-09 ENCOUNTER — Encounter: Payer: Self-pay | Admitting: Nurse Practitioner

## 2011-12-09 ENCOUNTER — Ambulatory Visit (INDEPENDENT_AMBULATORY_CARE_PROVIDER_SITE_OTHER): Payer: Medicare Other | Admitting: Nurse Practitioner

## 2011-12-09 VITALS — BP 124/80 | HR 86 | Ht 69.5 in | Wt 177.0 lb

## 2011-12-09 DIAGNOSIS — F411 Generalized anxiety disorder: Secondary | ICD-10-CM | POA: Diagnosis not present

## 2011-12-09 DIAGNOSIS — I10 Essential (primary) hypertension: Secondary | ICD-10-CM | POA: Diagnosis not present

## 2011-12-09 DIAGNOSIS — F419 Anxiety disorder, unspecified: Secondary | ICD-10-CM

## 2011-12-09 NOTE — Assessment & Plan Note (Signed)
Blood pressure looks good at home. She will continue to monitor. No change in her medicines. I will see her back in about 3 months. Patient is agreeable to this plan and will call if any problems develop in the interim.

## 2011-12-09 NOTE — Patient Instructions (Signed)
Continue with your current medicines.  Exercise every day.   We will see you back in about 3 months.  Call the [facility] office at [phone] if you have any questions, problems or concerns.

## 2011-12-09 NOTE — Progress Notes (Signed)
Carla Lewis Date of Birth: Sep 12, 1946 Medical Record #[id number]  History of Present Illness: Carla Lewis is seen back today for a one month check. She is seen for Dr. Swaziland. She has a history of HTN with LVH. She is on higher doses of LisinoprilHCT. She is intolerant to Bystolic and Diovan. Last month she came in with a multitude of complaints. She was sad and seemed to be depressed. Blood pressure was trending back up. She was quite tearful. She was not sleeping at night. She was not exercising. Her husband came with her and thought she was depressed as well. She agreed to start on Zoloft.   She comes back today for follow up. She is here alone today. She is doing so much better. Feels better. Blood pressure readings at home are excellent. She is exercising. She is only on 25 mg of Zoloft and is very happy with the results.   Current Outpatient Prescriptions on File Prior to Visit  Medication Sig Dispense Refill  . ALPRAZolam (XANAX) 0.25 MG tablet Take 0.25 mg by mouth as needed.        Marland Kitchen aspirin 81 MG tablet Take 81 mg by mouth daily.        . cetirizine (ZYRTEC) 10 MG tablet Take 10 mg by mouth daily.        . Cholecalciferol (VITAMIN D) 1000 UNITS capsule Take 1,000 Units by mouth daily.        . Coenzyme Q10 (COQ10) 100 MG CAPS Take by mouth daily.        . Glucosamine-Chondroitin (MOVE FREE PO) Take by mouth daily.        Marland Kitchen lisinopril-hydrochlorothiazide (PRINZIDE,ZESTORETIC) 10-12.5 MG per tablet Take 1 tablet by mouth daily.  30 tablet  11  . Multiple Vitamin (MULTIVITAMIN) tablet Take 1 tablet by mouth daily.        . pantoprazole (PROTONIX) 40 MG tablet Take 1 tablet (40 mg total) by mouth daily.  30 tablet  11  . pravastatin (PRAVACHOL) 20 MG tablet Take 1 tablet (20 mg total) by mouth daily.  30 tablet  11  . sertraline (ZOLOFT) 50 MG tablet Try 1/2 tablet daily for the next 2 weeks and then increase to one full tablet daily  30 tablet  6  . Wheat Dextrin (BENEFIBER PO) Take  by mouth daily. 2 DAILY          Allergies  Allergen Reactions  . Bystolic (Nebivolol Hcl)     No energy and lightheadedness.  . Codeine   . Diovan (Valsartan)     Anxiety  . Sulfa Drugs Cross Reactors     Past Medical History  Diagnosis Date  . Obesity   . GERD (gastroesophageal reflux disease)   . Palpitations   . Anxiety   . Hypertension   . Ankle bruise     SPONTANEOUS BRUISE ON RIGHT ANKLE  . Hyperlipidemia   . Liver cyst   . LVH (left ventricular hypertrophy)     MILD LVH per echo in 2007  . Mitral valve regurgitation     Past Surgical History  Procedure Date  . Breast lumpectomy 1995    RIGHT BREAST  . US echocardiography 2007    SHOWED EF OF 55-60%  . Cholecystectomy     History  Smoking status  . Never Smoker   Smokeless tobacco  . Not on file    History  Alcohol Use No    Family History  Problem Relation Age of Onset  .  Heart disease Father 73  . Hypertension Father 69    Review of Systems: The review of systems is per the HPI.  All other systems were reviewed and are negative.  Physical Exam: Ht 5' 9.5" (1.765 m)  Wt 177 lb (80.287 kg)  BMI 25.76 kg/m2 Patient is very pleasant and in no acute distress. Skin is warm and dry. Color is normal.  HEENT is unremarkable. Normocephalic/atraumatic. PERRL. Sclera are nonicteric. Neck is supple. No masses. No JVD. Lungs are clear. Cardiac exam shows a regular rate and rhythm. Abdomen is soft. Extremities are without edema. Gait and ROM are intact. No gross neurologic deficits noted.   LABORATORY DATA: N/A   Assessment / Plan:

## 2011-12-09 NOTE — Assessment & Plan Note (Signed)
She is greatly improved with low dose Zoloft. She will continue.

## 2012-01-11 DIAGNOSIS — I10 Essential (primary) hypertension: Secondary | ICD-10-CM | POA: Diagnosis not present

## 2012-01-11 DIAGNOSIS — Z23 Encounter for immunization: Secondary | ICD-10-CM | POA: Diagnosis not present

## 2012-01-11 DIAGNOSIS — M949 Disorder of cartilage, unspecified: Secondary | ICD-10-CM | POA: Diagnosis not present

## 2012-01-11 DIAGNOSIS — Z0000 Encounter for general adult medical examination without abnormal findings: Secondary | ICD-10-CM | POA: Diagnosis not present

## 2012-01-11 DIAGNOSIS — K5900 Constipation, unspecified: Secondary | ICD-10-CM | POA: Diagnosis not present

## 2012-01-20 ENCOUNTER — Encounter: Payer: Self-pay | Admitting: Nurse Practitioner

## 2012-01-20 ENCOUNTER — Ambulatory Visit (INDEPENDENT_AMBULATORY_CARE_PROVIDER_SITE_OTHER): Payer: Medicare Other | Admitting: Nurse Practitioner

## 2012-01-20 VITALS — BP 128/90 | HR 64 | Ht 69.0 in | Wt 182.0 lb

## 2012-01-20 DIAGNOSIS — I10 Essential (primary) hypertension: Secondary | ICD-10-CM

## 2012-01-20 NOTE — Progress Notes (Signed)
Carla Lewis Date of Birth: 03/19/1946 Medical Record #[id number]  History of Present Illness: Carla Lewis is seen back today for a follow up visit. She is seen for Dr. Swaziland. She has a history of HTN with LVH. Shehas been intolerant to Bystolic and Diovan. She is on higher doses of Lisinopril HCT with good control. She has had some issues with depression and is on low dose Zoloft.   She comes in today. She is here with her friend Carla Lewis. She is doing well. This is actually an old appointment. She has no complaint. She feels good on her medicines. Blood pressure is doing well. She has had a recent physical with Dr. Felipa Lewis and that went well. She is exercising most days of the week and feels good. Spirits are brighter as well.  Current Outpatient Prescriptions on File Prior to Visit  Medication Sig Dispense Refill  . ALPRAZolam (XANAX) 0.25 MG tablet Take 0.25 mg by mouth as needed.        Marland Kitchen aspirin 81 MG tablet Take 81 mg by mouth daily.        . cetirizine (ZYRTEC) 10 MG tablet Take 10 mg by mouth daily.        . Cholecalciferol (VITAMIN D) 1000 UNITS capsule Take 1,000 Units by mouth daily.        . Coenzyme Q10 (COQ10) 100 MG CAPS Take by mouth daily.        . Glucosamine-Chondroitin (MOVE FREE PO) Take by mouth daily.        Marland Kitchen lisinopril-hydrochlorothiazide (PRINZIDE,ZESTORETIC) 10-12.5 MG per tablet Take 1 tablet by mouth daily.  30 tablet  11  . Multiple Vitamin (MULTIVITAMIN) tablet Take 1 tablet by mouth daily.        . pantoprazole (PROTONIX) 40 MG tablet Take 1 tablet (40 mg total) by mouth daily.  30 tablet  11  . pravastatin (PRAVACHOL) 20 MG tablet Take 1 tablet (20 mg total) by mouth daily.  30 tablet  11  . sertraline (ZOLOFT) 50 MG tablet Try 1/2 tablet daily for the next 2 weeks and then increase to one full tablet daily  30 tablet  6  . Wheat Dextrin (BENEFIBER PO) Take by mouth daily. 2 DAILY          Allergies  Allergen Reactions  . Bystolic (Nebivolol Hcl)     No  energy and lightheadedness.  . Codeine   . Diovan (Valsartan)     Anxiety  . Sulfa Drugs Cross Reactors     Past Medical History  Diagnosis Date  . Obesity   . GERD (gastroesophageal reflux disease)   . Palpitations   . Anxiety   . Hypertension   . Ankle bruise     SPONTANEOUS BRUISE ON RIGHT ANKLE  . Hyperlipidemia   . Liver cyst   . LVH (left ventricular hypertrophy)     MILD LVH per echo in 2007  . Mitral valve regurgitation     Past Surgical History  Procedure Date  . Breast lumpectomy 1995    RIGHT BREAST  . US echocardiography 2007    SHOWED EF OF 55-60%  . Cholecystectomy     History  Smoking status  . Never Smoker   Smokeless tobacco  . Not on file    History  Alcohol Use No    Family History  Problem Relation Age of Onset  . Heart disease Father 57  . Hypertension Father 44    Review of Systems: The review of  systems is per the HPI.  All other systems were reviewed and are negative.  Physical Exam: BP 128/90  Pulse 64  Ht 5\' 9"  (1.753 m)  Wt 182 lb (82.555 kg)  BMI 26.88 kg/m2 Patient is very pleasant and in no acute distress. Skin is warm and dry. Color is normal.  HEENT is unremarkable. Normocephalic/atraumatic. PERRL. Sclera are nonicteric. Neck is supple. No masses. No JVD. Lungs are clear. Cardiac exam shows a regular rate and rhythm. Abdomen is soft. Extremities are without edema. Gait and ROM are intact. No gross neurologic deficits noted.   LABORATORY DATA: N/A   Assessment / Plan:

## 2012-01-20 NOTE — Assessment & Plan Note (Signed)
She is doing well. She will continue with her current medicines, exercise and diet program. I have cancelled her follow up for May. We will see her back in August. Patient is agreeable to this plan and will call if any problems develop in the interim.

## 2012-01-20 NOTE — Patient Instructions (Signed)
I think you are doing well.  Stay on your current medicines  We will see you back in about 4 months.  Call the [facility] office at [phone] if you have any questions, problems or concerns.

## 2012-01-26 ENCOUNTER — Other Ambulatory Visit: Payer: Self-pay | Admitting: Obstetrics and Gynecology

## 2012-01-26 DIAGNOSIS — Z1231 Encounter for screening mammogram for malignant neoplasm of breast: Secondary | ICD-10-CM

## 2012-01-31 ENCOUNTER — Other Ambulatory Visit: Payer: Self-pay | Admitting: Cardiology

## 2012-02-11 ENCOUNTER — Ambulatory Visit: Payer: Medicare Other

## 2012-02-27 ENCOUNTER — Other Ambulatory Visit: Payer: Self-pay | Admitting: Cardiology

## 2012-03-07 ENCOUNTER — Ambulatory Visit
Admission: RE | Admit: 2012-03-07 | Discharge: 2012-03-07 | Disposition: A | Discharge status: Home / Self Care | Payer: Medicare Other | Source: Ambulatory Visit | Attending: Obstetrics and Gynecology | Admitting: Obstetrics and Gynecology

## 2012-03-07 ENCOUNTER — Ambulatory Visit: Payer: Medicare Other | Admitting: Nurse Practitioner

## 2012-03-07 DIAGNOSIS — Z1231 Encounter for screening mammogram for malignant neoplasm of breast: Secondary | ICD-10-CM | POA: Diagnosis not present

## 2012-04-28 DIAGNOSIS — Z01419 Encounter for gynecological examination (general) (routine) without abnormal findings: Secondary | ICD-10-CM | POA: Diagnosis not present

## 2012-04-28 DIAGNOSIS — Z0000 Encounter for general adult medical examination without abnormal findings: Secondary | ICD-10-CM | POA: Diagnosis not present

## 2012-05-03 DIAGNOSIS — H2589 Other age-related cataract: Secondary | ICD-10-CM | POA: Diagnosis not present

## 2012-05-03 DIAGNOSIS — H353190 Nonexudative age-related macular degeneration, unspecified eye, stage unspecified: Secondary | ICD-10-CM | POA: Diagnosis not present

## 2012-07-12 ENCOUNTER — Other Ambulatory Visit: Payer: Self-pay | Admitting: Gastroenterology

## 2012-07-12 DIAGNOSIS — K824 Cholesterolosis of gallbladder: Secondary | ICD-10-CM

## 2012-07-18 ENCOUNTER — Ambulatory Visit (INDEPENDENT_AMBULATORY_CARE_PROVIDER_SITE_OTHER): Payer: Medicare Other | Admitting: Nurse Practitioner

## 2012-07-18 ENCOUNTER — Ambulatory Visit
Admission: RE | Admit: 2012-07-18 | Discharge: 2012-07-18 | Disposition: A | Discharge status: Home / Self Care | Payer: Medicare Other | Source: Ambulatory Visit | Attending: Gastroenterology | Admitting: Gastroenterology

## 2012-07-18 ENCOUNTER — Telehealth: Payer: Self-pay | Admitting: *Deleted

## 2012-07-18 ENCOUNTER — Encounter: Payer: Self-pay | Admitting: Nurse Practitioner

## 2012-07-18 VITALS — BP 122/74 | HR 64 | Ht 69.0 in | Wt 184.0 lb

## 2012-07-18 DIAGNOSIS — I10 Essential (primary) hypertension: Secondary | ICD-10-CM

## 2012-07-18 DIAGNOSIS — K7689 Other specified diseases of liver: Secondary | ICD-10-CM | POA: Diagnosis not present

## 2012-07-18 DIAGNOSIS — E785 Hyperlipidemia, unspecified: Secondary | ICD-10-CM

## 2012-07-18 DIAGNOSIS — K824 Cholesterolosis of gallbladder: Secondary | ICD-10-CM

## 2012-07-18 LAB — BASIC METABOLIC PANEL
BUN: 16 mg/dL (ref 6–23)
CO2: 28 mEq/L (ref 19–32)
Calcium: 8.9 mg/dL (ref 8.4–10.5)
Chloride: 102 mEq/L (ref 96–112)
Creatinine, Ser: 0.8 mg/dL (ref 0.4–1.2)
GFR: 80.87 mL/min (ref >60.00)
Glucose, Bld: 92 mg/dL (ref 70–99)
Potassium: 3.5 mEq/L (ref 3.5–5.1)
Sodium: 136 mEq/L (ref 135–145)

## 2012-07-18 LAB — HEPATIC FUNCTION PANEL
ALT: 18 U/L (ref 0–35)
AST: 20 U/L (ref 0–37)
Albumin: 3.7 g/dL (ref 3.5–5.2)
Alkaline Phosphatase: 57 U/L (ref 39–117)
Bilirubin, Direct: 0.1 mg/dL (ref 0.0–0.3)
Total Bilirubin: 0.5 mg/dL (ref 0.3–1.2)
Total Protein: 6.3 g/dL (ref 6.0–8.3)

## 2012-07-18 LAB — LIPID PANEL
Cholesterol: 172 mg/dL (ref 0–200)
HDL: 50.4 mg/dL (ref >39.00)
LDL Cholesterol: 109 mg/dL — ABNORMAL HIGH (ref 0–99)
Total CHOL/HDL Ratio: 3
Triglycerides: 65 mg/dL (ref 0.0–149.0)
VLDL: 13 mg/dL (ref 0.0–40.0)

## 2012-07-18 NOTE — Patient Instructions (Signed)
Stay on your current medicines  We will check labs today  I will see you back in about 4 months  Call the [facility] office at [phone] if you have any questions, problems or concerns.

## 2012-07-18 NOTE — Telephone Encounter (Signed)
Sent copy to Dr. Felipa Eth.  Called patient to notify.  LMTCB; Vista Mink, CMA

## 2012-07-18 NOTE — Telephone Encounter (Signed)
Pt returned my call regarding lab results.  Pt aware all normal. Vista Mink, CMA

## 2012-07-18 NOTE — Telephone Encounter (Signed)
Message copied by Awilda Bill on Mon Jul 18, 2012  1:55 PM ------      Message from: Rosalio Macadamia      Created: Mon Jul 18, 2012  1:08 PM       Ok to report. Labs are satisfactory.  Stay on same medicines. Recheck BMET/HPF/LIPIDS  in 6 months. Please send copy to Dr. Felipa Eth.

## 2012-07-18 NOTE — Progress Notes (Signed)
Carla Lewis Date of Birth: Feb 22, 1946 Medical Record #[id number]  History of Present Illness: Carla Lewis is seen back today for a 5 month follow up visit. She is seen for Dr. Swazil[place]. She has a history of HTN with LVH. She has been intolerant to Bystolic and Diovan. She is on higher doses of Lisinopril HCT. Other issues include HLD, GERD, allergies and depression/anxiety. Dr. Felipa Eth follows her labs.   She comes in today. She is here alone. She is doing well. Walking every day. No chest pain. BP has been doing well. Some dry cough, but nothing consistent and she is not wanting to switch her medicines. Needs labs today. More stressed with her husband having recent cath/PCI.    Current Outpatient Prescriptions on File Prior to Visit  Medication Sig Dispense Refill  . ALPRAZolam (XANAX) 0.25 MG tablet Take 0.25 mg by mouth as needed.        Carla Lewis aspirin 81 MG tablet Take 81 mg by mouth daily.        . cetirizine (ZYRTEC) 10 MG tablet Take 10 mg by mouth daily.        . Cholecalciferol (VITAMIN D) 1000 UNITS capsule Take 1,000 Units by mouth daily.        . Coenzyme Q10 (COQ10) 100 MG CAPS Take by mouth daily.        . Glucosamine-Chondroitin (MOVE FREE PO) Take by mouth daily.        Carla Lewis lisinopril-hydrochlorothiazide (PRINZIDE,ZESTORETIC) 10-12.5 MG per tablet Take 1 tablet by mouth daily.  30 tablet  11  . Multiple Vitamin (MULTIVITAMIN) tablet Take 1 tablet by mouth daily.        . pantoprazole (PROTONIX) 40 MG tablet TAKE 1 TABLET BY MOUTH EVERY DAY  30 tablet  11  . pravastatin (PRAVACHOL) 20 MG tablet TAKE 1 TABLET BY MOUTH EVERY DAY  30 tablet  6  . sertraline (ZOLOFT) 50 MG tablet Try 1/2 tablet daily for the next 2 weeks and then increase to one full tablet daily  30 tablet  6  . Wheat Dextrin (BENEFIBER PO) Take by mouth daily. 2 DAILY          Allergies  Allergen Reactions  . Bystolic (Nebivolol Hcl)     No energy and lightheadedness.  . Codeine   . Diovan (Valsartan)    Anxiety  . Sulfa Drugs Cross Reactors     Past Medical History  Diagnosis Date  . Obesity   . GERD (gastroesophageal reflux disease)   . Palpitations   . Anxiety   . Hypertension   . Ankle bruise     SPONTANEOUS BRUISE ON RIGHT ANKLE  . Hyperlipidemia   . Liver cyst   . LVH (left ventricular hypertrophy)     MILD LVH per echo in 2007  . Mitral valve regurgitation     Past Surgical History  Procedure Date  . Breast lumpectomy 1995    RIGHT BREAST  . US echocardiography 2007    SHOWED EF OF 55-60%  . Cholecystectomy     History  Smoking status  . Never Smoker   Smokeless tobacco  . Not on file    History  Alcohol Use No    Family History  Problem Relation Age of Onset  . Heart disease Father 46  . Hypertension Father 75    Review of Systems: The review of systems is per the HPI.  All other systems were reviewed and are negative.  Physical Exam: There were no  vitals taken for this visit. Patient is very pleasant and in no acute distress. Skin is warm and dry. Color is normal.  HEENT is unremarkable. Normocephalic/atraumatic. PERRL. Sclera are nonicteric. Neck is supple. No masses. No JVD. Lungs are clear. Cardiac exam shows a regular rate and rhythm. Abdomen is soft. Extremities are without edema. Gait and ROM are intact. No gross neurologic deficits noted.  LABORATORY DATA: PENDING FOR TODAY  Lab Results  Component Value Date   WBC 5.6 09/21/2011   HGB 14.3 09/21/2011   HCT 41.9 09/21/2011   PLT 282.0 09/21/2011   GLUCOSE 86 09/21/2011   ALT 16 02/12/2011   AST 25 02/12/2011   NA 137 09/21/2011   K 4.6 09/21/2011   CL 101 09/21/2011   CREATININE 0.8 09/21/2011   BUN 15 09/21/2011   CO2 29 09/21/2011    Assessment / Plan: 1. HTN - her readings from home are excellent. No change in her current therapy.   2. HLD - on statin therapy. Will recheck her labs today.   3. Depression - she seems much better on her low dose Zoloft.   Call the [facility]  office at [phone] if you have any questions, problems or concerns.   Patient is agreeable to this plan and will call if any problems develop in the interim.

## 2012-07-19 ENCOUNTER — Telehealth: Payer: Self-pay | Admitting: *Deleted

## 2012-07-19 NOTE — Telephone Encounter (Signed)
LMTCB regarding date of follow up appt with Lawson Fiscal.  Vista Mink, CMA

## 2012-07-23 ENCOUNTER — Other Ambulatory Visit: Payer: Self-pay | Admitting: Nurse Practitioner

## 2012-08-11 DIAGNOSIS — K219 Gastro-esophageal reflux disease without esophagitis: Secondary | ICD-10-CM | POA: Diagnosis not present

## 2012-08-11 DIAGNOSIS — Z1331 Encounter for screening for depression: Secondary | ICD-10-CM | POA: Diagnosis not present

## 2012-08-11 DIAGNOSIS — Z23 Encounter for immunization: Secondary | ICD-10-CM | POA: Diagnosis not present

## 2012-08-11 DIAGNOSIS — I10 Essential (primary) hypertension: Secondary | ICD-10-CM | POA: Diagnosis not present

## 2012-08-11 DIAGNOSIS — E785 Hyperlipidemia, unspecified: Secondary | ICD-10-CM | POA: Diagnosis not present

## 2012-08-11 DIAGNOSIS — F411 Generalized anxiety disorder: Secondary | ICD-10-CM | POA: Diagnosis not present

## 2012-08-19 ENCOUNTER — Other Ambulatory Visit: Payer: Self-pay | Admitting: Cardiology

## 2012-08-19 MED ORDER — LISINOPRIL-HYDROCHLOROTHIAZIDE 10-12.5 MG PO TABS: 1.0000 | ORAL_TABLET | Freq: Every day | ORAL | Status: DC

## 2012-09-07 DIAGNOSIS — H52229 Regular astigmatism, unspecified eye: Secondary | ICD-10-CM | POA: Diagnosis not present

## 2012-09-07 DIAGNOSIS — H5210 Myopia, unspecified eye: Secondary | ICD-10-CM | POA: Diagnosis not present

## 2012-09-07 DIAGNOSIS — H353190 Nonexudative age-related macular degeneration, unspecified eye, stage unspecified: Secondary | ICD-10-CM | POA: Diagnosis not present

## 2012-09-07 DIAGNOSIS — H2510 Age-related nuclear cataract, unspecified eye: Secondary | ICD-10-CM | POA: Diagnosis not present

## 2012-09-20 ENCOUNTER — Other Ambulatory Visit: Payer: Self-pay | Admitting: Nurse Practitioner

## 2012-10-27 ENCOUNTER — Other Ambulatory Visit: Payer: Self-pay | Admitting: Dermatology

## 2012-10-27 DIAGNOSIS — L821 Other seborrheic keratosis: Secondary | ICD-10-CM | POA: Diagnosis not present

## 2012-10-27 DIAGNOSIS — D239 Other benign neoplasm of skin, unspecified: Secondary | ICD-10-CM | POA: Diagnosis not present

## 2012-10-27 DIAGNOSIS — L570 Actinic keratosis: Secondary | ICD-10-CM | POA: Diagnosis not present

## 2012-10-27 DIAGNOSIS — D485 Neoplasm of uncertain behavior of skin: Secondary | ICD-10-CM | POA: Diagnosis not present

## 2012-10-27 DIAGNOSIS — I789 Disease of capillaries, unspecified: Secondary | ICD-10-CM | POA: Diagnosis not present

## 2012-10-27 DIAGNOSIS — D2370 Other benign neoplasm of skin of unspecified lower limb, including hip: Secondary | ICD-10-CM | POA: Diagnosis not present

## 2012-10-27 DIAGNOSIS — Z85828 Personal history of other malignant neoplasm of skin: Secondary | ICD-10-CM | POA: Diagnosis not present

## 2012-10-27 DIAGNOSIS — L819 Disorder of pigmentation, unspecified: Secondary | ICD-10-CM | POA: Diagnosis not present

## 2012-10-27 DIAGNOSIS — D1801 Hemangioma of skin and subcutaneous tissue: Secondary | ICD-10-CM | POA: Diagnosis not present

## 2012-11-21 ENCOUNTER — Ambulatory Visit (INDEPENDENT_AMBULATORY_CARE_PROVIDER_SITE_OTHER): Payer: Medicare Other | Admitting: Nurse Practitioner

## 2012-11-21 ENCOUNTER — Encounter: Payer: Self-pay | Admitting: Nurse Practitioner

## 2012-11-21 VITALS — BP 142/86 | HR 64 | Ht 69.0 in | Wt 182.1 lb

## 2012-11-21 DIAGNOSIS — H539 Unspecified visual disturbance: Secondary | ICD-10-CM

## 2012-11-21 DIAGNOSIS — I10 Essential (primary) hypertension: Secondary | ICD-10-CM

## 2012-11-21 NOTE — Progress Notes (Signed)
Carla Lewis Date of Birth: 10/15/1946 Medical Record #[id number]  History of Present Illness: Carla Lewis is seen back today for a follow up visit. She is seen for Dr. Swaziland. She has HTN with LVH. She is intolerant to Bystolic and to Diovan. She is on higher doses of Lisinopril HCT. Other issues include HLD, GRD, allergies and depression/anxiety. Dr. Felipa Eth follows her labs.   I saw her back in November. She was doing well.   She comes in today. She is here alone. She is doing ok. Has got back off track with her diet and exercise program and the holidays have come and gone. She feels ok. No chest pain. Not short of breath. BP at home is better. She feels ok on her medicines. Does have a cough at night that she feels is related to drainage. No dry hacky cough otherwise. Says she had a spell a couple of weeks ago where she was watching TV and everything "got jagged and looked like puzzle pieces". She thinks it was a migraine but never had the headache. No recurrence. Sees Dr. Felipa Eth next month with labs.   Current Outpatient Prescriptions on File Prior to Visit  Medication Sig Dispense Refill  . ALPRAZolam (XANAX) 0.25 MG tablet Take 0.25 mg by mouth as needed.        Marland Kitchen aspirin 81 MG tablet Take 81 mg by mouth daily.        . cetirizine (ZYRTEC) 10 MG tablet Take 10 mg by mouth daily.        . Cholecalciferol (VITAMIN D) 1000 UNITS capsule Take 1,000 Units by mouth daily.        . Coenzyme Q10 (COQ10) 100 MG CAPS Take by mouth daily.        . Glucosamine-Chondroitin (MOVE FREE PO) Take by mouth daily.        Marland Kitchen lisinopril-hydrochlorothiazide (PRINZIDE,ZESTORETIC) 10-12.5 MG per tablet Take 1 tablet by mouth daily.  30 tablet  11  . Multiple Vitamin (MULTIVITAMIN) tablet Take 1 tablet by mouth daily.        . pantoprazole (PROTONIX) 40 MG tablet TAKE 1 TABLET BY MOUTH EVERY DAY  30 tablet  11  . pravastatin (PRAVACHOL) 40 MG tablet Take 40 mg by mouth daily.      . sertraline (ZOLOFT) 50 MG  tablet       . Wheat Dextrin (BENEFIBER PO) Take by mouth daily. 2 DAILY          Allergies  Allergen Reactions  . Bystolic (Nebivolol Hcl)     No energy and lightheadedness.  . Codeine   . Diovan (Valsartan)     Anxiety  . Sulfa Drugs Cross Reactors     Past Medical History  Diagnosis Date  . Obesity   . GERD (gastroesophageal reflux disease)   . Palpitations   . Anxiety   . Hypertension   . Ankle bruise     SPONTANEOUS BRUISE ON RIGHT ANKLE  . Hyperlipidemia   . Liver cyst   . LVH (left ventricular hypertrophy)     MILD LVH per echo in 2007  . Mitral valve regurgitation     Past Surgical History  Procedure Date  . Breast lumpectomy 1995    RIGHT BREAST  . US echocardiography 2007    SHOWED EF OF 55-60%  . Cholecystectomy     History  Smoking status  . Never Smoker   Smokeless tobacco  . Not on file    History  Alcohol  Use No    Family History  Problem Relation Age of Onset  . Heart disease Father 29  . Hypertension Father 75    Review of Systems: The review of systems is per the HPI.  All other systems were reviewed and are negative.  Physical Exam: BP 142/86  Pulse 64  Ht 5\' 9"  (1.753 m)  Wt 182 lb 1.9 oz (82.609 kg)  BMI 26.89 kg/m2 Weight is down 2 pounds today.  Patient is very pleasant and in no acute distress. Skin is warm and dry. Color is normal.  HEENT is unremarkable. Normocephalic/atraumatic. PERRL. Sclera are nonicteric. Neck is supple. No masses. No JVD. Lungs are clear. Cardiac exam shows a regular rate and rhythm. Abdomen is soft. Extremities are without edema. Gait and ROM are intact. No gross neurologic deficits noted.  LABORATORY DATA:  Lab Results  Component Value Date   WBC 5.6 09/21/2011   HGB 14.3 09/21/2011   HCT 41.9 09/21/2011   PLT 282.0 09/21/2011   GLUCOSE 92 07/18/2012   CHOL 172 07/18/2012   TRIG 65.0 07/18/2012   HDL 50.40 07/18/2012   LDLCALC 109* 07/18/2012   ALT 18 07/18/2012   AST 20 07/18/2012   NA 136  07/18/2012   K 3.5 07/18/2012   CL 102 07/18/2012   CREATININE 0.8 07/18/2012   BUN 16 07/18/2012   CO2 28 07/18/2012    Assessment / Plan:  1. HTN - BP at home is better controlled. No change in her medicines.   2. HLD - has follow up labs with her PCP next month  3. Depression -  On Zoloft  4. Obesity - needs to get back on track with diet and weight loss.   5. Vision change - ? Ocular migraine. She is worried about carotid disease. No other stroke like symptloms. Will check carotid duplex.   Patient is agreeable to this plan and will call if any problems develop in the interim.

## 2012-11-21 NOTE — Patient Instructions (Addendum)
Stay on your current medicines  We will get a carotid doppler to rule out carotid vascular disease  Dr. Swaziland will see you in 6 months  Stay active and keep working on your weight  Call the [facility] office at [phone] if you have any questions, problems or concerns.

## 2012-11-22 ENCOUNTER — Encounter (INDEPENDENT_AMBULATORY_CARE_PROVIDER_SITE_OTHER): Payer: Medicare Other

## 2012-11-22 DIAGNOSIS — I6529 Occlusion and stenosis of unspecified carotid artery: Secondary | ICD-10-CM | POA: Diagnosis not present

## 2012-11-22 DIAGNOSIS — H539 Unspecified visual disturbance: Secondary | ICD-10-CM

## 2012-11-22 DIAGNOSIS — H53129 Transient visual loss, unspecified eye: Secondary | ICD-10-CM

## 2012-11-22 DIAGNOSIS — I10 Essential (primary) hypertension: Secondary | ICD-10-CM

## 2012-11-25 DIAGNOSIS — J069 Acute upper respiratory infection, unspecified: Secondary | ICD-10-CM | POA: Diagnosis not present

## 2013-02-13 ENCOUNTER — Encounter: Payer: Self-pay | Admitting: Internal Medicine

## 2013-02-13 DIAGNOSIS — I10 Essential (primary) hypertension: Secondary | ICD-10-CM | POA: Diagnosis not present

## 2013-02-13 DIAGNOSIS — E785 Hyperlipidemia, unspecified: Secondary | ICD-10-CM | POA: Diagnosis not present

## 2013-02-13 DIAGNOSIS — M949 Disorder of cartilage, unspecified: Secondary | ICD-10-CM | POA: Diagnosis not present

## 2013-02-13 DIAGNOSIS — M899 Disorder of bone, unspecified: Secondary | ICD-10-CM | POA: Diagnosis not present

## 2013-02-14 ENCOUNTER — Other Ambulatory Visit: Payer: Self-pay

## 2013-02-14 DIAGNOSIS — Z1231 Encounter for screening mammogram for malignant neoplasm of breast: Secondary | ICD-10-CM

## 2013-02-15 ENCOUNTER — Other Ambulatory Visit: Payer: Self-pay | Admitting: Nurse Practitioner

## 2013-02-20 DIAGNOSIS — E785 Hyperlipidemia, unspecified: Secondary | ICD-10-CM | POA: Diagnosis not present

## 2013-02-20 DIAGNOSIS — Z1212 Encounter for screening for malignant neoplasm of rectum: Secondary | ICD-10-CM | POA: Diagnosis not present

## 2013-02-20 DIAGNOSIS — K219 Gastro-esophageal reflux disease without esophagitis: Secondary | ICD-10-CM | POA: Diagnosis not present

## 2013-02-20 DIAGNOSIS — Z0000 Encounter for general adult medical examination without abnormal findings: Secondary | ICD-10-CM | POA: Diagnosis not present

## 2013-02-20 DIAGNOSIS — J309 Allergic rhinitis, unspecified: Secondary | ICD-10-CM | POA: Diagnosis not present

## 2013-02-20 DIAGNOSIS — K5900 Constipation, unspecified: Secondary | ICD-10-CM | POA: Diagnosis not present

## 2013-02-20 DIAGNOSIS — L259 Unspecified contact dermatitis, unspecified cause: Secondary | ICD-10-CM | POA: Diagnosis not present

## 2013-02-20 DIAGNOSIS — I10 Essential (primary) hypertension: Secondary | ICD-10-CM | POA: Diagnosis not present

## 2013-02-20 DIAGNOSIS — R05 Cough: Secondary | ICD-10-CM | POA: Diagnosis not present

## 2013-03-06 DIAGNOSIS — H43819 Vitreous degeneration, unspecified eye: Secondary | ICD-10-CM | POA: Diagnosis not present

## 2013-03-08 ENCOUNTER — Ambulatory Visit
Admission: RE | Admit: 2013-03-08 | Discharge: 2013-03-08 | Disposition: A | Discharge status: Home / Self Care | Payer: Medicare Other | Source: Ambulatory Visit

## 2013-03-08 DIAGNOSIS — Z1231 Encounter for screening mammogram for malignant neoplasm of breast: Secondary | ICD-10-CM | POA: Diagnosis not present

## 2013-03-16 ENCOUNTER — Other Ambulatory Visit: Payer: Self-pay | Admitting: Nurse Practitioner

## 2013-03-18 ENCOUNTER — Other Ambulatory Visit: Payer: Self-pay | Admitting: Nurse Practitioner

## 2013-03-20 ENCOUNTER — Other Ambulatory Visit: Payer: Self-pay | Admitting: *Deleted

## 2013-03-24 ENCOUNTER — Encounter: Payer: Self-pay | Admitting: Cardiology

## 2013-04-11 DIAGNOSIS — S83289A Other tear of lateral meniscus, current injury, unspecified knee, initial encounter: Secondary | ICD-10-CM | POA: Diagnosis not present

## 2013-04-11 DIAGNOSIS — IMO0002 Reserved for concepts with insufficient information to code with codable children: Secondary | ICD-10-CM | POA: Diagnosis not present

## 2013-04-13 ENCOUNTER — Other Ambulatory Visit: Payer: Self-pay | Admitting: Nurse Practitioner

## 2013-05-25 ENCOUNTER — Ambulatory Visit (INDEPENDENT_AMBULATORY_CARE_PROVIDER_SITE_OTHER): Payer: Medicare Other | Admitting: Cardiology

## 2013-05-25 ENCOUNTER — Encounter: Payer: Self-pay | Admitting: Cardiology

## 2013-05-25 VITALS — BP 120/80 | HR 66 | Ht 69.0 in | Wt 190.1 lb

## 2013-05-25 DIAGNOSIS — I517 Cardiomegaly: Secondary | ICD-10-CM

## 2013-05-25 DIAGNOSIS — I10 Essential (primary) hypertension: Secondary | ICD-10-CM

## 2013-05-25 DIAGNOSIS — E785 Hyperlipidemia, unspecified: Secondary | ICD-10-CM | POA: Diagnosis not present

## 2013-05-25 DIAGNOSIS — R002 Palpitations: Secondary | ICD-10-CM

## 2013-05-25 NOTE — Progress Notes (Signed)
Carla Lewis Date of Birth: 1946/08/12 Medical Record #[id number]  History of Present Illness: Carla Lewis is seen back today for a follow up visit.  She has HTN with LVH. She is intolerant to Bystolic and to Diovan. She is on higher doses of Lisinopril HCT. Other issues include HLD, GRD, allergies and depression/anxiety. On followup today she is feeling very well. Her blood pressure at home has been doing well typically less than 135/80. She has reduced her caffeine intake. Her left knee is acting up from an old anterior cruciate ligament tear. She has seen orthopedics. She does very little aerobic activity. She has gained about 8 pounds. She denies any chest pain, shortness of breath, or palpitations. She did have carotid Dopplers done her last visit here which showed minimal nonobstructive plaque.  Current Outpatient Prescriptions on File Prior to Visit  Medication Sig Dispense Refill  . ALPRAZolam (XANAX) 0.25 MG tablet Take 0.25 mg by mouth as needed.        Marland Kitchen aspirin 81 MG tablet Take 81 mg by mouth daily.        . cetirizine (ZYRTEC) 10 MG tablet Take 10 mg by mouth daily.        . Cholecalciferol (VITAMIN D) 1000 UNITS capsule Take 1,000 Units by mouth daily.        . Coenzyme Q10 (COQ10) 100 MG CAPS Take by mouth daily.        . Glucosamine-Chondroitin (MOVE FREE PO) Take by mouth daily.        Marland Kitchen lisinopril-hydrochlorothiazide (PRINZIDE,ZESTORETIC) 10-12.5 MG per tablet Take 1 tablet by mouth daily.  30 tablet  11  . Multiple Vitamin (MULTIVITAMIN) tablet Take 1 tablet by mouth daily.        . pantoprazole (PROTONIX) 40 MG tablet TAKE 1 TABLET BY MOUTH EVERY DAY  30 tablet  6  . pravastatin (PRAVACHOL) 40 MG tablet Take 40 mg by mouth daily.      . sertraline (ZOLOFT) 50 MG tablet       . sertraline (ZOLOFT) 50 MG tablet TAKE 1/2 TABLET DAILY FOR NEXT 2 WEEKS,THEN INCREASE TO 1 TABLET DAILY  30 tablet  6  . Wheat Dextrin (BENEFIBER PO) Take by mouth daily. 2 DAILY         No  current facility-administered medications on file prior to visit.    Allergies  Allergen Reactions  . Bystolic (Nebivolol Hcl)     No energy and lightheadedness.  . Codeine   . Diovan (Valsartan)     Anxiety  . Sulfa Drugs Cross Reactors     Past Medical History  Diagnosis Date  . Obesity   . GERD (gastroesophageal reflux disease)   . Palpitations   . Anxiety   . Hypertension   . Ankle bruise     SPONTANEOUS BRUISE ON RIGHT ANKLE  . Hyperlipidemia   . Liver cyst   . LVH (left ventricular hypertrophy)     MILD LVH per echo in 2007  . Mitral valve regurgitation     Past Surgical History  Procedure Laterality Date  . Breast lumpectomy  1995    RIGHT BREAST  . US echocardiography  2007    SHOWED EF OF 55-60%  . Cholecystectomy      History  Smoking status  . Never Smoker   Smokeless tobacco  . Not on file    History  Alcohol Use No    Family History  Problem Relation Age of Onset  . Heart disease  Father 22  . Hypertension Father 39    Review of Systems: The review of systems is per the HPI.  All other systems were reviewed and are negative.  Physical Exam: BP 120/80  Pulse 66  Ht 5\' 9"  (1.753 m)  Wt 190 lb 1.9 oz (86.238 kg)  BMI 28.06 kg/m2  SpO2 97% Weight is down 2 pounds today.  Patient is very pleasant and in no acute distress. Skin is warm and dry. Color is normal.  HEENT is unremarkable. Normocephalic/atraumatic. PERRL. Sclera are nonicteric. Neck is supple. No masses. No JVD. Lungs are clear. Cardiac exam shows a regular rate and rhythm. Abdomen is soft. Extremities are without edema. Gait and ROM are intact. No gross neurologic deficits noted.  LABORATORY DATA:  Lab Results  Component Value Date   WBC 5.6 09/21/2011   HGB 14.3 09/21/2011   HCT 41.9 09/21/2011   PLT 282.0 09/21/2011   GLUCOSE 92 07/18/2012   CHOL 172 07/18/2012   TRIG 65.0 07/18/2012   HDL 50.40 07/18/2012   LDLCALC 109* 07/18/2012   ALT 18 07/18/2012   AST 20 07/18/2012    NA 136 07/18/2012   K 3.5 07/18/2012   CL 102 07/18/2012   CREATININE 0.8 07/18/2012   BUN 16 07/18/2012   CO2 28 07/18/2012   More recent lab work reviewed from [facility] and is scanned in [place]. These results were reviewed.  Assessment / Plan:  1. HTN - BP is well-controlled. Continue her current medications. We'll plan on followup in 6 months.  2. HLD - under fair control on pravastatin.  3. Depression -  On Zoloft  4. Obesity - needs to increase her aerobic activity and lose weight.

## 2013-05-25 NOTE — Patient Instructions (Signed)
Continue your current therapy  Increase your aerobic activity and lose weight.  We will schedule you to see Lawson Fiscal in 6 months.

## 2013-06-15 DIAGNOSIS — Z124 Encounter for screening for malignant neoplasm of cervix: Secondary | ICD-10-CM | POA: Diagnosis not present

## 2013-06-15 DIAGNOSIS — Z01419 Encounter for gynecological examination (general) (routine) without abnormal findings: Secondary | ICD-10-CM | POA: Diagnosis not present

## 2013-07-20 ENCOUNTER — Other Ambulatory Visit: Payer: Self-pay | Admitting: Cardiology

## 2013-07-20 MED ORDER — PRAVASTATIN SODIUM 40 MG PO TABS: 40.0000 mg | ORAL_TABLET | Freq: Every day | ORAL | Status: DC

## 2013-08-15 ENCOUNTER — Other Ambulatory Visit: Payer: Self-pay | Admitting: Cardiology

## 2013-08-22 DIAGNOSIS — H2510 Age-related nuclear cataract, unspecified eye: Secondary | ICD-10-CM | POA: Diagnosis not present

## 2013-09-20 DIAGNOSIS — J069 Acute upper respiratory infection, unspecified: Secondary | ICD-10-CM | POA: Diagnosis not present

## 2013-10-01 ENCOUNTER — Other Ambulatory Visit: Payer: Self-pay | Admitting: Nurse Practitioner

## 2013-11-18 ENCOUNTER — Other Ambulatory Visit: Payer: Self-pay | Admitting: Cardiology

## 2013-11-21 ENCOUNTER — Ambulatory Visit (INDEPENDENT_AMBULATORY_CARE_PROVIDER_SITE_OTHER): Payer: Medicare Other | Admitting: Nurse Practitioner

## 2013-11-21 ENCOUNTER — Encounter: Payer: Self-pay | Admitting: Nurse Practitioner

## 2013-11-21 VITALS — BP 138/74 | HR 68 | Ht 69.5 in | Wt 192.1 lb

## 2013-11-21 DIAGNOSIS — E785 Hyperlipidemia, unspecified: Secondary | ICD-10-CM | POA: Diagnosis not present

## 2013-11-21 DIAGNOSIS — I10 Essential (primary) hypertension: Secondary | ICD-10-CM

## 2013-11-21 DIAGNOSIS — I6529 Occlusion and stenosis of unspecified carotid artery: Secondary | ICD-10-CM | POA: Diagnosis not present

## 2013-11-21 NOTE — Progress Notes (Signed)
[place] Date of Birth: 1945-11-04 Medical Record #[id number]  History of Present Illness: Carla Lewis is seen back today for her 6 month check. Seen for Dr. Martinique. She has HTN with LVH. She is intolerant to Bystolic and to Diovan. She is on higher doses of Lisinopril HCT. Other issues include HLD, GRD, allergies and depression/anxiety. Dr. Dagmar Hait follows her labs. She has minimal nonobstructive plaque on carotid duplex from February of 2014. Last Myoview was from 2010.   Last seen by Dr. Martinique in August - was doing well but having knee issues that impaired her ability to exercise.   Comes back today. Here alone. Doing well without symptoms but back off track with taking care of herself. Not exercising. Weight back up. BP ok. For labs later this month with Dr. Dagmar Hait and will send to [place]. On just 20 mg of Pravachol - not clear as to why - but will review her upcoming labs.    Current Outpatient Prescriptions  Medication Sig Dispense Refill  . aspirin 81 MG tablet Take 81 mg by mouth daily.        . Coenzyme Q10 (COQ10) 100 MG CAPS Take by mouth daily.        Marland Kitchen docusate sodium (COLACE) 100 MG capsule Take 200 mg by mouth daily.      . Glucosamine-Chondroitin (MOVE FREE PO) Take by mouth daily.        Marland Kitchen lisinopril-hydrochlorothiazide (PRINZIDE,ZESTORETIC) 10-12.5 MG per tablet TAKE 1 TABLET BY MOUTH EVERY DAY  30 tablet  2  . loratadine (CLARITIN) 10 MG tablet Take 10 mg by mouth daily.      . Omega-3 Fatty Acids (FISH OIL) 1200 MG CAPS Take 1,200 mg by mouth daily.      . Omega-3 Krill Oil 500 MG CAPS Take 500 mg by mouth daily.      . pantoprazole (PROTONIX) 40 MG tablet TAKE 1 TABLET BY MOUTH EVERY DAY  30 tablet  6  . pravastatin (PRAVACHOL) 40 MG tablet Take 20 mg by mouth daily.      . Wheat Dextrin (BENEFIBER PO) Take by mouth daily. 2 DAILY         No current facility-administered medications for this visit.    Allergies  Allergen Reactions  . Bystolic [Nebivolol Hcl]    No energy and lightheadedness.  . Codeine   . Diovan [Valsartan]     Anxiety  . Sulfa Drugs Cross Reactors     Past Medical History  Diagnosis Date  . Obesity   . GERD (gastroesophageal reflux disease)   . Palpitations   . Anxiety   . Hypertension   . Ankle bruise     SPONTANEOUS BRUISE ON RIGHT ANKLE  . Hyperlipidemia   . Liver cyst   . LVH (left ventricular hypertrophy)     MILD LVH per echo in 2007  . Mitral valve regurgitation     Past Surgical History  Procedure Laterality Date  . Breast lumpectomy  1995    RIGHT BREAST  . US echocardiography  2007    SHOWED EF OF 55-60%  . Cholecystectomy      History  Smoking status  . Never Smoker   Smokeless tobacco  . Not on file    History  Alcohol Use No    Family History  Problem Relation Age of Onset  . Heart disease Father 39  . Hypertension Father 60    Review of Systems: The review of systems is per the HPI.  All other systems were reviewed and are negative.  Physical Exam: BP 138/74  Pulse 68  Ht 5' 9.5" (1.765 m)  Wt 192 lb 1.9 oz (87.145 kg)  BMI 27.97 kg/m2 Patient is very pleasant and in no acute distress. Weight is up 10 pounds. Skin is warm and dry. Color is normal.  HEENT is unremarkable. Normocephalic/atraumatic. PERRL. Sclera are nonicteric. Neck is supple. No masses. No JVD. Lungs are clear. Cardiac exam shows a regular rate and rhythm. Abdomen is soft. Extremities are without edema. Gait and ROM are intact. No gross neurologic deficits noted.  Wt Readings from Last 3 Encounters:  11/21/13 192 lb 1.9 oz (87.145 kg)  05/25/13 190 lb 1.9 oz (86.238 kg)  11/21/12 182 lb 1.9 oz (82.609 kg)    LABORATORY DATA:  Lab Results  Component Value Date   WBC 5.6 09/21/2011   HGB 14.3 09/21/2011   HCT 41.9 09/21/2011   PLT 282.0 09/21/2011   GLUCOSE 92 07/18/2012   CHOL 172 07/18/2012   TRIG 65.0 07/18/2012   HDL 50.40 07/18/2012   LDLCALC 109* 07/18/2012   ALT 18 07/18/2012   AST 20 07/18/2012   NA  136 07/18/2012   K 3.5 07/18/2012   CL 102 07/18/2012   CREATININE 0.8 07/18/2012   BUN 16 07/18/2012   CO2 28 07/18/2012     Assessment / Plan: 1. HTN - BP ok - no change in therapy   2. HLD - on low dose statin - for labs with her PCP later this month and she will send to [place] for review.   3. Obesity - this is the crux of her issues - discussed again in depth the need to make changes.   4. Carotid disease - will update her Myoview.  See her back in 6 months.   Patient is agreeable to this plan and will call if any problems develop in the interim.   Burtis Junes, RN, [name] [address] [place], [place]  [address] [phone]

## 2013-11-21 NOTE — Patient Instructions (Addendum)
Stay on your current medicines  Send me a copy of your labs and we will decide about your dose of Pravachol  Let's update your carotid duplex study  See me in 6 months  Call the [place] office at [phone] if you have any questions, problems or concerns.

## 2013-11-28 ENCOUNTER — Other Ambulatory Visit: Payer: Self-pay | Admitting: *Deleted

## 2013-11-28 ENCOUNTER — Telehealth: Payer: Self-pay | Admitting: Nurse Practitioner

## 2013-11-28 DIAGNOSIS — E785 Hyperlipidemia, unspecified: Secondary | ICD-10-CM

## 2013-11-28 NOTE — Telephone Encounter (Signed)
S/w pt wants to come in to the office for lab work I scheduled pt's lab appt for tomorrow, lft/bmet/lipid, pt aware and wants a copy routed to Dr. Dagmar Hait

## 2013-11-28 NOTE — Telephone Encounter (Signed)
New message  Pt called for statin results// Please call back to assist

## 2013-11-29 ENCOUNTER — Other Ambulatory Visit: Payer: Self-pay | Admitting: *Deleted

## 2013-11-29 ENCOUNTER — Other Ambulatory Visit (INDEPENDENT_AMBULATORY_CARE_PROVIDER_SITE_OTHER): Payer: Medicare Other

## 2013-11-29 DIAGNOSIS — E785 Hyperlipidemia, unspecified: Secondary | ICD-10-CM | POA: Diagnosis not present

## 2013-11-29 LAB — BASIC METABOLIC PANEL
BUN: 13 mg/dL (ref 6–23)
CO2: 28 mEq/L (ref 19–32)
Calcium: 8.9 mg/dL (ref 8.4–10.5)
Chloride: 103 mEq/L (ref 96–112)
Creatinine, Ser: 0.7 mg/dL (ref 0.4–1.2)
GFR: 90.04 mL/min (ref >60.00)
Glucose, Bld: 86 mg/dL (ref 70–99)
Potassium: 3.8 mEq/L (ref 3.5–5.1)
Sodium: 138 mEq/L (ref 135–145)

## 2013-11-29 LAB — HEPATIC FUNCTION PANEL
ALT: 16 U/L (ref 0–35)
AST: 21 U/L (ref 0–37)
Albumin: 3.6 g/dL (ref 3.5–5.2)
Alkaline Phosphatase: 69 U/L (ref 39–117)
Bilirubin, Direct: 0.1 mg/dL (ref 0.0–0.3)
Total Bilirubin: 0.7 mg/dL (ref 0.3–1.2)
Total Protein: 6.6 g/dL (ref 6.0–8.3)

## 2013-11-29 LAB — LIPID PANEL
Cholesterol: 175 mg/dL (ref 0–200)
HDL: 53.2 mg/dL (ref >39.00)
LDL Cholesterol: 108 mg/dL — ABNORMAL HIGH (ref 0–99)
Total CHOL/HDL Ratio: 3
Triglycerides: 70 mg/dL (ref 0.0–149.0)
VLDL: 14 mg/dL (ref 0.0–40.0)

## 2013-11-29 MED ORDER — PRAVASTATIN SODIUM 40 MG PO TABS: 40.0000 mg | ORAL_TABLET | Freq: Every day | ORAL | Status: DC

## 2013-12-04 ENCOUNTER — Ambulatory Visit (HOSPITAL_COMMUNITY): Payer: Medicare Other | Attending: Cardiovascular Disease

## 2013-12-04 DIAGNOSIS — I658 Occlusion and stenosis of other precerebral arteries: Secondary | ICD-10-CM | POA: Insufficient documentation

## 2013-12-04 DIAGNOSIS — I6529 Occlusion and stenosis of unspecified carotid artery: Secondary | ICD-10-CM

## 2013-12-04 DIAGNOSIS — E785 Hyperlipidemia, unspecified: Secondary | ICD-10-CM | POA: Diagnosis not present

## 2013-12-04 DIAGNOSIS — I10 Essential (primary) hypertension: Secondary | ICD-10-CM | POA: Insufficient documentation

## 2013-12-06 ENCOUNTER — Encounter: Payer: Self-pay | Admitting: Nurse Practitioner

## 2013-12-12 ENCOUNTER — Other Ambulatory Visit: Payer: Self-pay | Admitting: Nurse Practitioner

## 2013-12-12 DIAGNOSIS — I779 Disorder of arteries and arterioles, unspecified: Secondary | ICD-10-CM

## 2013-12-12 DIAGNOSIS — I739 Peripheral vascular disease, unspecified: Principal | ICD-10-CM

## 2013-12-19 ENCOUNTER — Encounter: Payer: Self-pay | Admitting: Cardiovascular Disease

## 2013-12-19 ENCOUNTER — Ambulatory Visit (INDEPENDENT_AMBULATORY_CARE_PROVIDER_SITE_OTHER): Payer: Medicare Other | Admitting: Cardiovascular Disease

## 2013-12-19 VITALS — BP 130/80 | HR 80 | Ht 69.5 in | Wt 194.8 lb

## 2013-12-19 DIAGNOSIS — I6529 Occlusion and stenosis of unspecified carotid artery: Secondary | ICD-10-CM | POA: Diagnosis not present

## 2013-12-19 NOTE — Progress Notes (Signed)
Primary care physician: Dr. Dagmar Hait Primary cardiologist: Dr. Carole Civil  HPI  This is a 68 year old female who was referred for evaluation of carotid artery disease.  She has known history of HTN with LVH, hyperlipidemia, GRD, allergies and depression/anxiety.  She had minimal nonobstructive plaque on carotid duplex from February of 2014. She had a repeat carotid Doppler in February of 2015 which showed mild atherosclerosis in the left side with less than 39% stenosis and increased velocity on the right side suggestive of 60-79% stenosis although no obvious stenosis was noted. She has no previous history of stroke. No symptoms suggestive of stroke.    Allergies  Allergen Reactions  . Bystolic [Nebivolol Hcl]     No energy and lightheadedness.  . Codeine   . Diovan [Valsartan]     Anxiety  . Sulfa Drugs Cross Reactors      Current Outpatient Prescriptions on File Prior to Visit  Medication Sig Dispense Refill  . aspirin 81 MG tablet Take 81 mg by mouth daily.        . Coenzyme Q10 (COQ10) 100 MG CAPS Take by mouth daily.        Marland Kitchen docusate sodium (COLACE) 100 MG capsule Take 200 mg by mouth daily.      . Glucosamine-Chondroitin (MOVE FREE PO) Take 1 capsule by mouth daily.       Marland Kitchen lisinopril-hydrochlorothiazide (PRINZIDE,ZESTORETIC) 10-12.5 MG per tablet TAKE 1 TABLET BY MOUTH EVERY DAY  30 tablet  2  . loratadine (CLARITIN) 10 MG tablet Take 10 mg by mouth daily.      . Omega-3 Fatty Acids (FISH OIL) 1200 MG CAPS Take 1,200 mg by mouth daily.      . Omega-3 Krill Oil 500 MG CAPS Take 500 mg by mouth daily.      . pantoprazole (PROTONIX) 40 MG tablet TAKE 1 TABLET BY MOUTH EVERY DAY  30 tablet  6  . pravastatin (PRAVACHOL) 40 MG tablet Take 1 tablet (40 mg total) by mouth daily.  30 tablet  6  . Wheat Dextrin (BENEFIBER PO) Take by mouth daily. TAKE 2 DAILY       No current facility-administered medications on file prior to visit.     Past Medical History  Diagnosis  Date  . Obesity   . GERD (gastroesophageal reflux disease)   . Palpitations   . Anxiety   . Hypertension   . Ankle bruise     SPONTANEOUS BRUISE ON RIGHT ANKLE  . Hyperlipidemia   . Liver cyst   . LVH (left ventricular hypertrophy)     MILD LVH per echo in 2007  . Mitral valve regurgitation      Past Surgical History  Procedure Laterality Date  . Breast lumpectomy  1995    RIGHT BREAST  . US echocardiography  2007    SHOWED EF OF 55-60%  . Cholecystectomy       Family History  Problem Relation Age of Onset  . Heart disease Father 106  . Hypertension Father 84     History   Social History  . Marital Status: Married    Spouse Name: N/A    Number of Children: N/A  . Years of Education: N/A   Occupational History  . Not on file.   Social History Main Topics  . Smoking status: Never Smoker   . Smokeless tobacco: Not on file  . Alcohol Use: No  . Drug Use: No  . Sexual Activity: Yes   Other Topics  Concern  . Not on file   Social History Narrative  . No narrative on file     ROS A 10 point review of system was performed. It is negative other than that mentioned in the history of present illness.   PHYSICAL EXAM   BP 130/80  Pulse 80  Ht 5' 9.5" (1.765 m)  Wt 194 lb 12.8 oz (88.361 kg)  BMI 28.36 kg/m2 Constitutional: She is oriented to person, place, and time. She appears well-developed and well-nourished. No distress.  HENT: No nasal discharge.  Head: Normocephalic and atraumatic.  Eyes: Pupils are equal and round. No discharge.  Neck: Normal range of motion. Neck supple. No JVD present. No thyromegaly present.  Cardiovascular: Normal rate, regular rhythm, normal heart sounds. Exam reveals no gallop and no friction rub. No murmur heard.  Pulmonary/Chest: Effort normal and breath sounds normal. No stridor. No respiratory distress. She has no wheezes. She has no rales. She exhibits no tenderness.  Abdominal: Soft. Bowel sounds are normal. She  exhibits no distension. There is no tenderness. There is no rebound and no guarding.  Musculoskeletal: Normal range of motion. She exhibits no edema and no tenderness.  Neurological: She is alert and oriented to person, place, and time. Coordination normal.  Skin: Skin is warm and dry. No rash noted. She is not diaphoretic. No erythema. No pallor.  Psychiatric: She has a normal mood and affect. Her behavior is normal. Judgment and thought content normal.       ASSESSMENT AND PLAN

## 2013-12-19 NOTE — Patient Instructions (Signed)
Your physician has requested that you have a carotid duplex in February of 2016. This test is an ultrasound of the carotid arteries in your neck. It looks at blood flow through these arteries that supply the brain with blood. Allow one hour for this exam. There are no restrictions or special instructions.  Your physician recommends that you schedule a follow-up appointment in: as needed with Dr. Fletcher Anon.

## 2013-12-19 NOTE — Assessment & Plan Note (Signed)
The patient has mild bilateral carotid atherosclerosis. I reviewed the recent carotid doppler. Although the velocity was suggestive of a 60-79% stenosis on the right side, there was no visible stenosis to explain that. The vessel was tortuous in that area which likely caused the high velocity. The test is basically indicative of carotid atherosclerosis without significant stenosis.  Continue treatment of risk factors as is being done. Continue low dose Aspirin.  Consider an LDL target of less than 100.  Follow up as needed.

## 2013-12-20 NOTE — Telephone Encounter (Signed)
Spoke to patient she already had question answered.She had a appointment with Dr.Arida 12/19/13.Carotid dopplers to be repeated in 1 year.

## 2014-01-04 ENCOUNTER — Telehealth: Payer: Self-pay | Admitting: *Deleted

## 2014-01-04 NOTE — Telephone Encounter (Signed)
There is mild plaque build up in both arteries . The blockages are less than 40% on both sides.  She has no restrictions.

## 2014-01-04 NOTE — Telephone Encounter (Signed)
Patient is having trouble understanding her carotid duplex results.  She has been thinking about it daily and feels like she can not understand her results. She wants to know if she has a blockage or just stenosis?  She thought Dr. Fletcher Anon said she did not have a blockage but an issue with the velocity of her blood pumping.  She also would like to know if her Chiropractor can adjust her neck?

## 2014-01-04 NOTE — Telephone Encounter (Signed)
Informed patient that per Dr. Fletcher Anon "There is mild plaque build up in both arteries . The blockages are less than 40% on both sides.  She has no restrictions." Patient verbalized understanding.

## 2014-01-12 ENCOUNTER — Telehealth: Payer: Self-pay | Admitting: Cardiology

## 2014-01-12 MED ORDER — CLONIDINE HCL 0.1 MG PO TABS
0.1000 mg | ORAL_TABLET | Freq: Two times a day (BID) | ORAL | Status: DC | PRN
Start: 2014-01-12 — End: 2015-08-06

## 2014-01-12 NOTE — Telephone Encounter (Signed)
Pt called concerned her B/P has been going up today. She says she can tell when it's elevated and took her B/P at noon 156/80. This pm 182/86. She admits she is an "anxious person" and has had a rough week. I reassured her and suggested she take an extra Lisinopril and rest.   Kerin Ransom PA-C 01/12/2014 5:58 PM

## 2014-01-12 NOTE — Telephone Encounter (Signed)
Pt's husband called, Ms Holzheimer's B/P still high after extra Lisinopril HCTZ- "190/100". I suggested she take clonidine 0.1 mg up to BID prn systolic B/P > 735. She will call the office Monday for an appointment to come and be evaluated further.   Kerin Ransom PA-C 01/12/2014 7:25 PM

## 2014-01-15 ENCOUNTER — Other Ambulatory Visit: Payer: Self-pay | Admitting: *Deleted

## 2014-01-15 ENCOUNTER — Telehealth: Payer: Self-pay | Admitting: Cardiology

## 2014-01-15 ENCOUNTER — Encounter: Payer: Self-pay | Admitting: Nurse Practitioner

## 2014-01-15 ENCOUNTER — Ambulatory Visit (INDEPENDENT_AMBULATORY_CARE_PROVIDER_SITE_OTHER): Payer: Medicare Other | Admitting: Nurse Practitioner

## 2014-01-15 VITALS — BP 110/70 | HR 80 | Ht 69.5 in | Wt 189.4 lb

## 2014-01-15 DIAGNOSIS — I6529 Occlusion and stenosis of unspecified carotid artery: Secondary | ICD-10-CM

## 2014-01-15 DIAGNOSIS — I10 Essential (primary) hypertension: Secondary | ICD-10-CM

## 2014-01-15 MED ORDER — SERTRALINE HCL 50 MG PO TABS: 25.0000 mg | ORAL_TABLET | Freq: Every day | ORAL | Status: DC

## 2014-01-15 MED ORDER — LISINOPRIL-HYDROCHLOROTHIAZIDE 10-12.5 MG PO TABS: ORAL_TABLET | ORAL | Status: DC

## 2014-01-15 NOTE — Telephone Encounter (Signed)
Returned call to patient she stated her B/P has been up and down.Stated she called Dr.on call this past Friday B/P 194/100.Stated she spoke to [facility] PA and he prescribed Clonidine 0.1 mg.Stated she took clonidine 0.1 mg and at 12:15 am B/P 95/54.Stated Sat morning 8:00 am B/P 160/79,took another Clonidine 0.1 mg 1/2 tablet.Stated yesterday B/P 153/85 took Clonidine 0.1 mg 1/4 tablet.Last night B/P 174/93.Stated woke up at 2:00 am B/P 197/96 pulse 80.Stated she feels bad very weak.Appointment scheduled with Truitt Merle NP today 2:00 pm.

## 2014-01-15 NOTE — Patient Instructions (Signed)
Take the Zoloft each day - ok to use 1/2 tab  Increase the Lisinopril HCT to 2 pills each day - start tomorrow  I will see you in 3 weeks  Call the [place] office at [phone] if you have any questions, problems or concerns.

## 2014-01-15 NOTE — Telephone Encounter (Signed)
New problem   Pt has been having problems with her blood pressure all weekend. She stated she talked to the on call doc and he told her to call the office and get an appt this morning. Please call pt.

## 2014-01-15 NOTE — Progress Notes (Signed)
[place] Date of Birth: 1946/09/23 Medical Record #[id number]  History of Present Illness: Carla Lewis is seen back today for a work in visit. Seen for Dr. Martinique. She has HTN with LVH. She is intolerant to Bystolic and to Diovan. She has been on higher doses of Lisinopril HCT.   Other issues include HLD, GRD, allergies and depression/anxiety. Dr. Dagmar Hait follows her labs. She has minimal nonobstructive plaque on carotid duplex from February of 2015 and has seen Dr. Fletcher Anon for review. Last Myoview was from 2010.   Seen back in February by myself. Was doing ok. She wanted to see Dr. Fletcher Anon for discussion of her carotids.   Called earlier today. More stress over the past few weeks. BP labile. Called the on call staff over the weekend and got put on Clonidine. She has been taking a whole, a half, and even a 1/4th. BP has really dropped. Was told to follow up here.    Comes back today. Here with her good friend, Debroah Baller. Remains very anxious. Tearful. Has been using using her Zoloft just "prn". Did not realize that this was not the way it was to be taken. No chest pain. Has been split dosing her Pravachol due to myalgias with good results.   Current Outpatient Prescriptions  Medication Sig Dispense Refill  . aspirin 81 MG tablet Take 81 mg by mouth daily.        . cetirizine (ZYRTEC) 10 MG tablet Take 10 mg by mouth daily.      . cloNIDine (CATAPRES) 0.1 MG tablet Take 1 tablet (0.1 mg total) by mouth 2 (two) times daily as needed (for systolic B/P greater than 160).  20 tablet  0  . Coenzyme Q10 (COQ10) 100 MG CAPS Take by mouth daily.        Marland Kitchen docusate sodium (COLACE) 100 MG capsule Take 200 mg by mouth daily.      . Glucosamine-Chondroitin (MOVE FREE PO) Take 1 capsule by mouth daily.       Marland Kitchen lisinopril-hydrochlorothiazide (PRINZIDE,ZESTORETIC) 10-12.5 MG per tablet TAKE 1 TABLET BY MOUTH EVERY DAY  30 tablet  2  . Omega-3 Fatty Acids (FISH OIL) 1200 MG CAPS Take 1,200 mg by mouth daily.        . Omega-3 Krill Oil 500 MG CAPS Take 500 mg by mouth daily.      . pantoprazole (PROTONIX) 40 MG tablet TAKE 1 TABLET BY MOUTH EVERY DAY  30 tablet  6  . pravastatin (PRAVACHOL) 40 MG tablet Take 1 tablet (40 mg total) by mouth daily.  30 tablet  6  . sertraline (ZOLOFT) 50 MG tablet Take 50 mg by mouth as needed.      . Wheat Dextrin (BENEFIBER PO) Take by mouth daily. TAKE 2 DAILY       No current facility-administered medications for this visit.    Allergies  Allergen Reactions  . Bystolic [Nebivolol Hcl]     No energy and lightheadedness.  . Codeine   . Diovan [Valsartan]     Anxiety  . Sulfa Drugs Cross Reactors     Past Medical History  Diagnosis Date  . Obesity   . GERD (gastroesophageal reflux disease)   . Palpitations   . Anxiety   . Hypertension   . Ankle bruise     SPONTANEOUS BRUISE ON RIGHT ANKLE  . Hyperlipidemia   . Liver cyst   . LVH (left ventricular hypertrophy)     MILD LVH per echo in 2007  .  Mitral valve regurgitation     Past Surgical History  Procedure Laterality Date  . Breast lumpectomy  1995    RIGHT BREAST  . US echocardiography  2007    SHOWED EF OF 55-60%  . Cholecystectomy      History  Smoking status  . Never Smoker   Smokeless tobacco  . Not on file    History  Alcohol Use No    Family History  Problem Relation Age of Onset  . Heart disease Father 12  . Hypertension Father 4    Review of Systems: The review of systems is per the HPI.  All other systems were reviewed and are negative.  Physical Exam: BP 110/70  Pulse 80  Ht 5' 9.5" (1.765 m)  Wt 189 lb 6.4 oz (85.911 kg)  BMI 27.58 kg/m2 Patient is very pleasant and in no acute distress. She is tearful today. Anxious. Skin is warm and dry. Color is normal.  HEENT is unremarkable. Normocephalic/atraumatic. PERRL. Sclera are nonicteric. Neck is supple. No masses. No JVD. Lungs are clear. Cardiac exam shows a regular rate and rhythm. Abdomen is soft. Extremities are  without edema. Gait and ROM are intact. No gross neurologic deficits noted.  Wt Readings from Last 3 Encounters:  01/15/14 189 lb 6.4 oz (85.911 kg)  12/19/13 194 lb 12.8 oz (88.361 kg)  11/21/13 192 lb 1.9 oz (87.145 kg)     LABORATORY DATA: Lab Results  Component Value Date   WBC 5.6 09/21/2011   HGB 14.3 09/21/2011   HCT 41.9 09/21/2011   PLT 282.0 09/21/2011   GLUCOSE 86 11/29/2013   CHOL 175 11/29/2013   TRIG 70.0 11/29/2013   HDL 53.20 11/29/2013   LDLCALC 108* 11/29/2013   ALT 16 11/29/2013   AST 21 11/29/2013   NA 138 11/29/2013   K 3.8 11/29/2013   CL 103 11/29/2013   CREATININE 0.7 11/29/2013   BUN 13 11/29/2013   CO2 28 11/29/2013     Assessment / Plan: 1. HTN - will increase her Lisinopril HCT to 2 pills each day. I will see her back in about 3 weeks. Try to not use the Clonidine due to side effects. Check BMET on return.  2. Situational stress - I have asked her to take the Zoloft daily.  Patient is agreeable to this plan and will call if any problems develop in the interim.   Burtis Junes, RN, [facility] [address] [place], [place]  [address] [phone]

## 2014-02-05 ENCOUNTER — Ambulatory Visit (INDEPENDENT_AMBULATORY_CARE_PROVIDER_SITE_OTHER): Payer: Medicare Other | Admitting: Nurse Practitioner

## 2014-02-05 ENCOUNTER — Encounter: Payer: Self-pay | Admitting: Nurse Practitioner

## 2014-02-05 VITALS — BP 128/70 | HR 64 | Ht 69.5 in | Wt 192.4 lb

## 2014-02-05 DIAGNOSIS — E785 Hyperlipidemia, unspecified: Secondary | ICD-10-CM | POA: Diagnosis not present

## 2014-02-05 DIAGNOSIS — I10 Essential (primary) hypertension: Secondary | ICD-10-CM | POA: Diagnosis not present

## 2014-02-05 DIAGNOSIS — I6529 Occlusion and stenosis of unspecified carotid artery: Secondary | ICD-10-CM | POA: Diagnosis not present

## 2014-02-05 LAB — BASIC METABOLIC PANEL
BUN: 15 mg/dL (ref 6–23)
CO2: 27 mEq/L (ref 19–32)
Calcium: 9.1 mg/dL (ref 8.4–10.5)
Chloride: 96 mEq/L (ref 96–112)
Creatinine, Ser: 0.7 mg/dL (ref 0.4–1.2)
GFR: 93.09 mL/min (ref >60.00)
Glucose, Bld: 79 mg/dL (ref 70–99)
Potassium: 3.6 mEq/L (ref 3.5–5.1)
Sodium: 133 mEq/L — ABNORMAL LOW (ref 135–145)

## 2014-02-05 NOTE — Progress Notes (Signed)
[place] Date of Birth: 1946/10/12 Medical Record #[id number]  History of Present Illness: Carla Lewis is seen back today for a follow up visit. Seen for Dr. Martinique. She has HTN with LVH. She is intolerant to Bystolic and to Diovan. She has been on higher doses of Lisinopril HCT.   Other issues include HLD, GRD, allergies and depression/anxiety. Dr. Dagmar Hait follows her labs. She has minimal nonobstructive plaque on carotid duplex from February of 2015 and has seen Dr. Fletcher Anon for review.  She had a repeat carotid Doppler in February of 2015 which showed mild atherosclerosis in the left side with less than 39% stenosis and increased velocity on the right side suggestive of 60-79% stenosis although no obvious stenosis was noted. Last Myoview was from 2010.  Seen back in March for labile BP.  More stress and anxiety issues. Had called the on call staff and put on Clonidine which caused orthostasis - I changed that to just prn use and increased her Lisinopril HCT and had her start back on her Zoloft QD (had been taking just prn).   Comes back today. Here alone. Doing better. Readings on her BP look great. No chest pain. Not short of breath. Feels better overall. Knows she needs to be doing more activity. Some cramps noted at night which she will take vinegar for.  Current Outpatient Prescriptions  Medication Sig Dispense Refill  . aspirin 81 MG tablet Take 81 mg by mouth daily.        . cetirizine (ZYRTEC) 10 MG tablet Take 10 mg by mouth daily.      . Coenzyme Q10 (COQ10) 100 MG CAPS Take by mouth daily.        Marland Kitchen docusate sodium (COLACE) 100 MG capsule Take 200 mg by mouth daily.      . Glucosamine-Chondroitin (MOVE FREE PO) Take 1 capsule by mouth daily.       Marland Kitchen lisinopril-hydrochlorothiazide (PRINZIDE,ZESTORETIC) 10-12.5 MG per tablet TAKE 2 TABLETS BY MOUTH EVERY DAY  60 tablet  3  . Omega-3 Fatty Acids (FISH OIL) 1200 MG CAPS Take 1,200 mg by mouth daily.      . Omega-3 Krill Oil 500 MG  CAPS Take 500 mg by mouth daily.      . pantoprazole (PROTONIX) 40 MG tablet TAKE 1 TABLET BY MOUTH EVERY DAY  30 tablet  6  . pravastatin (PRAVACHOL) 40 MG tablet Take 1 tablet (40 mg total) by mouth daily.  30 tablet  6  . sertraline (ZOLOFT) 50 MG tablet Take 0.5 tablets (25 mg total) by mouth daily.  30 tablet  6  . Wheat Dextrin (BENEFIBER PO) Take by mouth daily. TAKE 2 DAILY      . cloNIDine (CATAPRES) 0.1 MG tablet Take 1 tablet (0.1 mg total) by mouth 2 (two) times daily as needed (for systolic B/P greater than 160).  20 tablet  0   No current facility-administered medications for this visit.    Allergies  Allergen Reactions  . Bystolic [Nebivolol Hcl]     No energy and lightheadedness.  . Codeine   . Diovan [Valsartan]     Anxiety  . Sulfa Drugs Cross Reactors     Past Medical History  Diagnosis Date  . Obesity   . GERD (gastroesophageal reflux disease)   . Palpitations   . Anxiety   . Hypertension   . Ankle bruise     SPONTANEOUS BRUISE ON RIGHT ANKLE  . Hyperlipidemia   . Liver cyst   .  LVH (left ventricular hypertrophy)     MILD LVH per echo in 2007  . Mitral valve regurgitation     Past Surgical History  Procedure Laterality Date  . Breast lumpectomy  1995    RIGHT BREAST  . US echocardiography  2007    SHOWED EF OF 55-60%  . Cholecystectomy      History  Smoking status  . Never Smoker   Smokeless tobacco  . Not on file    History  Alcohol Use No    Family History  Problem Relation Age of Onset  . Heart disease Father 25  . Hypertension Father 80    Review of Systems: The review of systems is per the HPI.  All other systems were reviewed and are negative.  Physical Exam: BP 128/70  Pulse 64  Ht 5' 9.5" (1.765 m)  Wt 192 lb 6.4 oz (87.272 kg)  BMI 28.01 kg/m2  SpO2 99% Patient is very pleasant and in no acute distress. Skin is warm and dry. Color is normal.  HEENT is unremarkable. Normocephalic/atraumatic. PERRL. Sclera are  nonicteric. Neck is supple. No masses. No JVD. Lungs are clear. Cardiac exam shows a regular rate and rhythm. Abdomen is soft. Extremities are without edema. Gait and ROM are intact. No gross neurologic deficits noted.  Wt Readings from Last 3 Encounters:  02/05/14 192 lb 6.4 oz (87.272 kg)  01/15/14 189 lb 6.4 oz (85.911 kg)  12/19/13 194 lb 12.8 oz (88.361 kg)     LABORATORY DATA: BMET is pending  Lab Results  Component Value Date   WBC 5.6 09/21/2011   HGB 14.3 09/21/2011   HCT 41.9 09/21/2011   PLT 282.0 09/21/2011   GLUCOSE 86 11/29/2013   CHOL 175 11/29/2013   TRIG 70.0 11/29/2013   HDL 53.20 11/29/2013   LDLCALC 108* 11/29/2013   ALT 16 11/29/2013   AST 21 11/29/2013   NA 138 11/29/2013   K 3.8 11/29/2013   CL 103 11/29/2013   CREATININE 0.7 11/29/2013   BUN 13 11/29/2013   CO2 28 11/29/2013    Assessment / Plan: 1. HTN - recent increase in her  Lisinopril HCT to 2 pills each day. Check BMET today - BP looks great. I have asked her to continue to monitor at home. See back in August as planned.   2. Situational stress - back on her Zoloft daily.   Patient is agreeable to this plan and will call if any problems develop in the interim.   Burtis Junes, RN, [place]  [address] [place]  [place], [place] [address]  [phone]

## 2014-02-05 NOTE — Patient Instructions (Addendum)
Stay on your current medicines  We will recheck your lab today  See me in August as planned  Call the [place] office at [phone] if you have any questions, problems or concerns.

## 2014-02-12 ENCOUNTER — Other Ambulatory Visit: Payer: Self-pay

## 2014-02-12 DIAGNOSIS — Z1231 Encounter for screening mammogram for malignant neoplasm of breast: Secondary | ICD-10-CM

## 2014-02-20 ENCOUNTER — Other Ambulatory Visit: Payer: Self-pay | Admitting: *Deleted

## 2014-02-20 ENCOUNTER — Telehealth: Payer: Self-pay | Admitting: Nurse Practitioner

## 2014-02-20 MED ORDER — LISINOPRIL-HYDROCHLOROTHIAZIDE 10-12.5 MG PO TABS: 1.0000 | ORAL_TABLET | Freq: Two times a day (BID) | ORAL | Status: DC

## 2014-02-20 MED ORDER — LISINOPRIL-HYDROCHLOROTHIAZIDE 20-25 MG PO TABS: 1.0000 | ORAL_TABLET | Freq: Every day | ORAL | Status: DC

## 2014-02-20 NOTE — Telephone Encounter (Signed)
S/w pt and [facility] at [facility] if pt stays on Lisinopril-HCTZ 10-12.5 bid insurance will need a prior auth if  Change medication to Lisinopril-HCTZ 20-25 daily insurance will cover the medication for 4 dollars will route to [place] to be advised

## 2014-02-20 NOTE — Telephone Encounter (Signed)
Change to Lisinopril HCT 20/25 and take one each day

## 2014-02-20 NOTE — Telephone Encounter (Signed)
New message     Lisinopril/HCTZ---patient want directions to say 1 tab twice a day instead of 2 tab daily.  Will this be ok

## 2014-02-20 NOTE — Telephone Encounter (Signed)
S/w pt will try taking the lisinopril-hctz 20-25 mg daily pt aware and [facility] at [facility] is aware pt will call back if new medication does is not working

## 2014-03-06 DIAGNOSIS — I10 Essential (primary) hypertension: Secondary | ICD-10-CM | POA: Diagnosis not present

## 2014-03-06 DIAGNOSIS — Z23 Encounter for immunization: Secondary | ICD-10-CM | POA: Diagnosis not present

## 2014-03-06 DIAGNOSIS — M949 Disorder of cartilage, unspecified: Secondary | ICD-10-CM | POA: Diagnosis not present

## 2014-03-06 DIAGNOSIS — K219 Gastro-esophageal reflux disease without esophagitis: Secondary | ICD-10-CM | POA: Diagnosis not present

## 2014-03-06 DIAGNOSIS — Z1331 Encounter for screening for depression: Secondary | ICD-10-CM | POA: Diagnosis not present

## 2014-03-06 DIAGNOSIS — I6529 Occlusion and stenosis of unspecified carotid artery: Secondary | ICD-10-CM | POA: Diagnosis not present

## 2014-03-06 DIAGNOSIS — L259 Unspecified contact dermatitis, unspecified cause: Secondary | ICD-10-CM | POA: Diagnosis not present

## 2014-03-06 DIAGNOSIS — D126 Benign neoplasm of colon, unspecified: Secondary | ICD-10-CM | POA: Diagnosis not present

## 2014-03-06 DIAGNOSIS — Z0000 Encounter for general adult medical examination without abnormal findings: Secondary | ICD-10-CM | POA: Diagnosis not present

## 2014-03-06 DIAGNOSIS — M899 Disorder of bone, unspecified: Secondary | ICD-10-CM | POA: Diagnosis not present

## 2014-03-06 DIAGNOSIS — E785 Hyperlipidemia, unspecified: Secondary | ICD-10-CM | POA: Diagnosis not present

## 2014-03-08 DIAGNOSIS — H43399 Other vitreous opacities, unspecified eye: Secondary | ICD-10-CM | POA: Diagnosis not present

## 2014-03-08 DIAGNOSIS — H52229 Regular astigmatism, unspecified eye: Secondary | ICD-10-CM | POA: Diagnosis not present

## 2014-03-08 DIAGNOSIS — H524 Presbyopia: Secondary | ICD-10-CM | POA: Diagnosis not present

## 2014-03-08 DIAGNOSIS — H5210 Myopia, unspecified eye: Secondary | ICD-10-CM | POA: Diagnosis not present

## 2014-03-14 DIAGNOSIS — Z1212 Encounter for screening for malignant neoplasm of rectum: Secondary | ICD-10-CM | POA: Diagnosis not present

## 2014-03-15 ENCOUNTER — Ambulatory Visit
Admission: RE | Admit: 2014-03-15 | Discharge: 2014-03-15 | Disposition: A | Discharge status: Home / Self Care | Payer: Medicare Other | Source: Ambulatory Visit

## 2014-03-15 DIAGNOSIS — Z1231 Encounter for screening mammogram for malignant neoplasm of breast: Secondary | ICD-10-CM | POA: Diagnosis not present

## 2014-04-16 DIAGNOSIS — S92309A Fracture of unspecified metatarsal bone(s), unspecified foot, initial encounter for closed fracture: Secondary | ICD-10-CM | POA: Diagnosis not present

## 2014-04-23 ENCOUNTER — Other Ambulatory Visit: Payer: Self-pay | Admitting: Cardiology

## 2014-04-23 ENCOUNTER — Telehealth: Payer: Self-pay | Admitting: Cardiology

## 2014-04-24 NOTE — Telephone Encounter (Signed)
Closed encounter °

## 2014-04-24 NOTE — Telephone Encounter (Signed)
pantoprazole (PROTONIX) 40 MG tablet  TAKE 1 TABLET BY MOUTH EVERY DAY   30 tablet   6   Burtis Junes, NP at 02/05/2014 10:17 AM

## 2014-05-07 DIAGNOSIS — Z4789 Encounter for other orthopedic aftercare: Secondary | ICD-10-CM | POA: Diagnosis not present

## 2014-05-23 ENCOUNTER — Ambulatory Visit: Payer: Medicare Other | Admitting: Nurse Practitioner

## 2014-05-30 ENCOUNTER — Ambulatory Visit: Payer: Medicare Other | Admitting: Nurse Practitioner

## 2014-06-29 ENCOUNTER — Ambulatory Visit (INDEPENDENT_AMBULATORY_CARE_PROVIDER_SITE_OTHER): Payer: Medicare Other | Admitting: Nurse Practitioner

## 2014-06-29 ENCOUNTER — Encounter: Payer: Self-pay | Admitting: Nurse Practitioner

## 2014-06-29 VITALS — BP 110/80 | HR 63 | Ht 69.5 in | Wt 184.8 lb

## 2014-06-29 DIAGNOSIS — I6529 Occlusion and stenosis of unspecified carotid artery: Secondary | ICD-10-CM | POA: Diagnosis not present

## 2014-06-29 DIAGNOSIS — R002 Palpitations: Secondary | ICD-10-CM | POA: Diagnosis not present

## 2014-06-29 DIAGNOSIS — E785 Hyperlipidemia, unspecified: Secondary | ICD-10-CM | POA: Diagnosis not present

## 2014-06-29 DIAGNOSIS — I10 Essential (primary) hypertension: Secondary | ICD-10-CM

## 2014-06-29 DIAGNOSIS — I517 Cardiomegaly: Secondary | ICD-10-CM

## 2014-06-29 DIAGNOSIS — R252 Cramp and spasm: Secondary | ICD-10-CM

## 2014-06-29 LAB — HEPATIC FUNCTION PANEL
ALT: 16 U/L (ref 0–35)
AST: 22 U/L (ref 0–37)
Albumin: 3.8 g/dL (ref 3.5–5.2)
Alkaline Phosphatase: 57 U/L (ref 39–117)
Bilirubin, Direct: 0 mg/dL (ref 0.0–0.3)
Total Bilirubin: 0.7 mg/dL (ref 0.2–1.2)
Total Protein: 6.7 g/dL (ref 6.0–8.3)

## 2014-06-29 LAB — LIPID PANEL
Cholesterol: 154 mg/dL (ref 0–200)
HDL: 43.2 mg/dL (ref >39.00)
LDL Cholesterol: 96 mg/dL (ref 0–99)
NonHDL: 110.8
Total CHOL/HDL Ratio: 4
Triglycerides: 75 mg/dL (ref 0.0–149.0)
VLDL: 15 mg/dL (ref 0.0–40.0)

## 2014-06-29 LAB — BASIC METABOLIC PANEL
BUN: 15 mg/dL (ref 6–23)
CO2: 29 mEq/L (ref 19–32)
Calcium: 9.1 mg/dL (ref 8.4–10.5)
Chloride: 98 mEq/L (ref 96–112)
Creatinine, Ser: 0.8 mg/dL (ref 0.4–1.2)
GFR: 72.62 mL/min (ref >60.00)
Glucose, Bld: 83 mg/dL (ref 70–99)
Potassium: 3.8 mEq/L (ref 3.5–5.1)
Sodium: 133 mEq/L — ABNORMAL LOW (ref 135–145)

## 2014-06-29 LAB — MAGNESIUM: Magnesium: 1.9 mg/dL (ref 1.5–2.5)

## 2014-06-29 NOTE — Patient Instructions (Addendum)
Stay on your current medicines   Keep up the good work with your diet/exercise  We will check lab today  Monitor your blood pressure at home for me - let me know if it is consistently staying below 828 systolic  I will see you in 6 months  Call the [place] office at [phone] if you have any questions, problems or concerns.

## 2014-06-29 NOTE — Progress Notes (Signed)
[place] Date of Birth: 12-13-1945 Medical Record #[id number]  History of Present Illness: Carla Lewis is seen back today for a follow up visit. Seen for Dr. Martinique. She has HTN with LVH. She is intolerant to Bystolic and to Diovan. She has been on higher doses of Lisinopril HCT.   Other issues include HLD, GRD, allergies and depression/anxiety. Dr. Dagmar Hait follows her labs. She has minimal nonobstructive plaque on carotid duplex from February of 2015 and has seen Dr. Fletcher Anon for review. She had a repeat carotid Doppler in February of 2015 which showed mild atherosclerosis in the left side with less than 39% stenosis and increased velocity on the right side suggestive of 60-79% stenosis although no obvious stenosis was noted. Last Myoview was from 2010.   Seen back in March of 2015 for labile BP. More stress and anxiety issues. Had called the on call staff and put on Clonidine which caused orthostasis - I changed that to just prn use and increased her Lisinopril HCT and had her start back on her Zoloft QD (had been taking just prn). Her BP improved and she was doing fine on her return OV.  Comes back today. Here alone. She is doing pretty good. Pretty mellow. Losing weight. Has cut her statin (due to some cramps) and BP meds in half and taking half BID. Feels pretty good. Walking 2 to 3 miles a day. No chest pain. Not short of breath.  Joined [employer]. Actively dealing with the stressors related to her mother.   Current Outpatient Prescriptions  Medication Sig Dispense Refill  . APPLE CIDER VINEGAR PO Take by mouth daily.      Marland Kitchen aspirin 81 MG tablet Take 81 mg by mouth daily.        . cetirizine (ZYRTEC) 10 MG tablet Take 10 mg by mouth daily.      . cloNIDine (CATAPRES) 0.1 MG tablet Take 1 tablet (0.1 mg total) by mouth 2 (two) times daily as needed (for systolic B/P greater than 160).  20 tablet  0  . Coenzyme Q10 (COQ10) 100 MG CAPS Take by mouth daily.        Marland Kitchen docusate sodium  (COLACE) 100 MG capsule Take 200 mg by mouth daily.      . Glucosamine-Chondroitin (MOVE FREE PO) Take 1 capsule by mouth daily.       Marland Kitchen lisinopril-hydrochlorothiazide (PRINZIDE,ZESTORETIC) 20-25 MG per tablet Take 12.5 tablets by mouth 2 (two) times daily.      . Omega-3 Fatty Acids (FISH OIL) 1200 MG CAPS Take 1,200 mg by mouth daily.      . Omega-3 Krill Oil 500 MG CAPS Take 500 mg by mouth daily.      . pantoprazole (PROTONIX) 40 MG tablet TAKE 1 TABLET BY MOUTH EVERY DAY  30 tablet  6  . pravastatin (PRAVACHOL) 40 MG tablet Take 20 mg by mouth 2 (two) times daily.      . sertraline (ZOLOFT) 50 MG tablet Take 0.5 tablets (25 mg total) by mouth daily.  30 tablet  6  . Wheat Dextrin (BENEFIBER PO) Take by mouth daily. TAKE 2 DAILY       No current facility-administered medications for this visit.    Allergies  Allergen Reactions  . Bystolic [Nebivolol Hcl]     No energy and lightheadedness.  . Codeine   . Diovan [Valsartan]     Anxiety  . Sulfa Drugs Cross Reactors     Past Medical History  Diagnosis Date  .  Obesity   . GERD (gastroesophageal reflux disease)   . Palpitations   . Anxiety   . Hypertension   . Ankle bruise     SPONTANEOUS BRUISE ON RIGHT ANKLE  . Hyperlipidemia   . Liver cyst   . LVH (left ventricular hypertrophy)     MILD LVH per echo in 2007  . Mitral valve regurgitation     Past Surgical History  Procedure Laterality Date  . Breast lumpectomy  1995    RIGHT BREAST  . US echocardiography  2007    SHOWED EF OF 55-60%  . Cholecystectomy      History  Smoking status  . Never Smoker   Smokeless tobacco  . Not on file    History  Alcohol Use No    Family History  Problem Relation Age of Onset  . Heart disease Father 5  . Hypertension Father 19    Review of Systems: The review of systems is per the HPI.  All other systems were reviewed and are negative.  Physical Exam: BP 110/80  Pulse 63  Ht 5' 9.5" (1.765 m)  Wt 184 lb 12.8 oz  (83.825 kg)  BMI 26.91 kg/m2  SpO2 98% Patient is very pleasant and in no acute distress. Her weight is down. Skin is warm and dry. Color is normal.  HEENT is unremarkable. Normocephalic/atraumatic. PERRL. Sclera are nonicteric. Neck is supple. No masses. No JVD. Lungs are clear. Cardiac exam shows a regular rate and rhythm. Abdomen is soft. Extremities are without edema. Gait and ROM are intact. No gross neurologic deficits noted.  Wt Readings from Last 3 Encounters:  06/29/14 184 lb 12.8 oz (83.825 kg)  02/05/14 192 lb 6.4 oz (87.272 kg)  01/15/14 189 lb 6.4 oz (85.911 kg)    LABORATORY DATA/PROCEDURES:  Lab Results  Component Value Date   WBC 5.6 09/21/2011   HGB 14.3 09/21/2011   HCT 41.9 09/21/2011   PLT 282.0 09/21/2011   GLUCOSE 79 02/05/2014   CHOL 175 11/29/2013   TRIG 70.0 11/29/2013   HDL 53.20 11/29/2013   LDLCALC 108* 11/29/2013   ALT 16 11/29/2013   AST 21 11/29/2013   NA 133* 02/05/2014   K 3.6 02/05/2014   CL 96 02/05/2014   CREATININE 0.7 02/05/2014   BUN 15 02/05/2014   CO2 27 02/05/2014    BNP (last 3 results) No results found for this basename: PROBNP,  in the last 8760 hours   Assessment / Plan: 1. HTN - BP is great - not checking at home - I have asked her to monitor. May be able to cut her medicine back if she can continue with losing weight. She will let us know.  2. Situational stress - back on her Zoloft daily. She is definitely improved.  3. Carotid disease - recheck next year.   See back in 6 months.   Patient is agreeable to this plan and will call if any problems develop in the interim.   Burtis Junes, RN, [place] [address] [place], [place]  [address] [phone]

## 2014-08-23 ENCOUNTER — Other Ambulatory Visit: Payer: Self-pay | Admitting: Nurse Practitioner

## 2014-08-24 ENCOUNTER — Telehealth: Payer: Self-pay | Admitting: *Deleted

## 2014-08-24 NOTE — Telephone Encounter (Signed)
Pt needs refill on lisinopril-hctz but Cecille Rubin and I looked at dose and did not know what the patient was taking.  Waiting for pt to call back.

## 2014-08-24 NOTE — Telephone Encounter (Signed)
Pt did call back and gave correct dose of medication pt is taking lisinopril-hctz 20-25 daily.  [facility]

## 2014-10-05 ENCOUNTER — Other Ambulatory Visit: Payer: Self-pay | Admitting: Nurse Practitioner

## 2014-12-07 ENCOUNTER — Other Ambulatory Visit (HOSPITAL_COMMUNITY): Payer: Self-pay | Admitting: *Deleted

## 2014-12-07 ENCOUNTER — Ambulatory Visit (HOSPITAL_COMMUNITY): Payer: Medicare Other | Attending: Cardiovascular Disease | Admitting: *Deleted

## 2014-12-07 DIAGNOSIS — I6529 Occlusion and stenosis of unspecified carotid artery: Secondary | ICD-10-CM

## 2014-12-07 DIAGNOSIS — I6523 Occlusion and stenosis of bilateral carotid arteries: Secondary | ICD-10-CM | POA: Diagnosis not present

## 2014-12-07 NOTE — Progress Notes (Signed)
Carotid Duplex Exam Performed 

## 2014-12-28 ENCOUNTER — Ambulatory Visit: Payer: Medicare Other | Admitting: Nurse Practitioner

## 2014-12-28 ENCOUNTER — Encounter (HOSPITAL_COMMUNITY): Payer: Medicare Other

## 2015-01-11 ENCOUNTER — Encounter: Payer: Self-pay | Admitting: Nurse Practitioner

## 2015-01-11 ENCOUNTER — Ambulatory Visit (INDEPENDENT_AMBULATORY_CARE_PROVIDER_SITE_OTHER): Payer: Medicare Other | Admitting: Nurse Practitioner

## 2015-01-11 VITALS — BP 120/82 | HR 71 | Ht 69.5 in | Wt 188.5 lb

## 2015-01-11 DIAGNOSIS — E785 Hyperlipidemia, unspecified: Secondary | ICD-10-CM

## 2015-01-11 DIAGNOSIS — I10 Essential (primary) hypertension: Secondary | ICD-10-CM

## 2015-01-11 DIAGNOSIS — I6529 Occlusion and stenosis of unspecified carotid artery: Secondary | ICD-10-CM | POA: Diagnosis not present

## 2015-01-11 LAB — LIPID PANEL
Cholesterol: 160 mg/dL (ref 0–200)
HDL: 51 mg/dL (ref >=46)
LDL Cholesterol: 91 mg/dL (ref 0–99)
Total CHOL/HDL Ratio: 3.1 Ratio
Triglycerides: 89 mg/dL (ref <150)
VLDL: 18 mg/dL (ref 0–40)

## 2015-01-11 LAB — BASIC METABOLIC PANEL
BUN: 12 mg/dL (ref 6–23)
CO2: 26 mEq/L (ref 19–32)
Calcium: 9.2 mg/dL (ref 8.4–10.5)
Chloride: 96 mEq/L (ref 96–112)
Creat: 0.63 mg/dL (ref 0.50–1.10)
Glucose, Bld: 89 mg/dL (ref 70–99)
Potassium: 3.7 mEq/L (ref 3.5–5.3)
Sodium: 132 mEq/L — ABNORMAL LOW (ref 135–145)

## 2015-01-11 LAB — HEPATIC FUNCTION PANEL
ALT: 17 U/L (ref 0–35)
AST: 23 U/L (ref 0–37)
Albumin: 3.8 g/dL (ref 3.5–5.2)
Alkaline Phosphatase: 57 U/L (ref 39–117)
Bilirubin, Direct: <0.1 mg/dL (ref 0.0–0.3)
Total Bilirubin: 0.4 mg/dL (ref 0.3–1.2)
Total Protein: 5.6 g/dL — ABNORMAL LOW (ref 6.0–8.3)

## 2015-01-11 NOTE — Progress Notes (Signed)
CARDIOLOGY OFFICE NOTE  Date:  01/11/2015    Carla Lewis Date of Birth: [birth year] Medical Record #[id number]  PCP:  Tivis Ringer, MD  Cardiologist:  [place] & [facility])  Chief Complaint  Patient presents with  . Hypertension    6 month check - seen for Dr. Martinique     History of Present Illness: Carla Lewis is a 69 y.o. female who presents today for a 6 month check. Seen for Dr. Martinique. She has HTN with LVH. She is intolerant to Bystolic and to Diovan. She has been on higher doses of Lisinopril HCT.   Other issues include HLD, GRD, allergies and depression/anxiety. Dr. Dagmar Hait follows her labs. She has minimal nonobstructive plaque on carotid duplex from February of 2015 and has seen Dr. Fletcher Anon for review. She had a repeat carotid Doppler in February of 2015 which showed mild atherosclerosis in the left side with less than 39% stenosis and increased velocity on the right side suggestive of 60-79% stenosis although no obvious stenosis was noted. Last Myoview was from 2010.   Seen back in March of 2015 for labile BP. More stress and anxiety issues. Had called the on call staff and put on Clonidine which caused orthostasis - I changed that to just prn use and increased her Lisinopril HCT and had her start back on her Zoloft QD (had been taking just prn). Her BP improved and she was doing fine on her return OV.  I last saw her back in September - she was doing well - losing weight, exercising, etc.  Comes back today. Here alone. Doing ok. Has had her carotids rechecked this month - reviewed by Dr. Fletcher Anon - they look better - actually improved - he has recommended to repeat in 2 years. No chest pain. Not short of breath. Feels pretty good. Her mother has had a fall and broke her hip - more memory issues and will be placed in a memory unit. The zoloft helps her deal with the stress related to her mother. She has gotten lax with the holidays - has gained weight, etc. BP  good at home.  Past Medical History  Diagnosis Date  . Obesity   . GERD (gastroesophageal reflux disease)   . Palpitations   . Anxiety   . Hypertension   . Ankle bruise     SPONTANEOUS BRUISE ON RIGHT ANKLE  . Hyperlipidemia   . Liver cyst   . LVH (left ventricular hypertrophy)     MILD LVH per echo in 2007  . Mitral valve regurgitation     Past Surgical History  Procedure Laterality Date  . Breast lumpectomy  1995    RIGHT BREAST  . US echocardiography  2007    SHOWED EF OF 55-60%  . Cholecystectomy       Medications: Current Outpatient Prescriptions  Medication Sig Dispense Refill  . APPLE CIDER VINEGAR PO Take by mouth daily.    Marland Kitchen aspirin 81 MG tablet Take 81 mg by mouth daily.      . cetirizine (ZYRTEC) 10 MG tablet Take 10 mg by mouth daily.    . cloNIDine (CATAPRES) 0.1 MG tablet Take 1 tablet (0.1 mg total) by mouth 2 (two) times daily as needed (for systolic B/P greater than 160). 20 tablet 0  . Coenzyme Q10 (COQ10) 100 MG CAPS Take by mouth daily.      Marland Kitchen docusate sodium (COLACE) 100 MG capsule Take 200 mg by mouth daily.    Marland Kitchen  Glucosamine-Chondroitin (MOVE FREE PO) Take 1 capsule by mouth daily.     Marland Kitchen lisinopril-hydrochlorothiazide (PRINZIDE,ZESTORETIC) 20-25 MG per tablet TAKE 1 TABLET BY MOUTH DAILY. 90 tablet 2  . Omega-3 Krill Oil 500 MG CAPS Take 500 mg by mouth daily.    Marland Kitchen omeprazole (PRILOSEC OTC) 20 MG tablet Take 20 mg by mouth daily.    . pravastatin (PRAVACHOL) 40 MG tablet TAKE 1 TABLET (40 MG TOTAL) BY MOUTH DAILY. (Patient taking differently: 1/2 tablet twice a day) 30 tablet 6  . sertraline (ZOLOFT) 50 MG tablet TAKE 1/2 TABLET BY MOUTH EVERY DAY 30 tablet 6  . Wheat Dextrin (BENEFIBER PO) Take by mouth daily. TAKE 2 DAILY     No current facility-administered medications for this visit.    Allergies: Allergies  Allergen Reactions  . Bystolic [Nebivolol Hcl]     No energy and lightheadedness.  . Codeine   . Diovan [Valsartan]     Anxiety    . Sulfa Drugs Cross Reactors     Social History: The patient  reports that she has never smoked. She does not have any smokeless tobacco history on file. She reports that she does not drink alcohol or use illicit drugs.   Family History: The patient's family history includes Heart disease (age of onset: 10) in her father; Hypertension (age of onset: 52) in her father.   Review of Systems: Please see the history of present illness.   Otherwise, the review of systems is positive for situational stress.   All other systems are reviewed and negative.   Physical Exam: VS:  BP 120/82 mmHg  Pulse 71  Ht 5' 9.5" (1.765 m)  Wt 188 lb 8 oz (85.503 kg)  BMI 27.45 kg/m2  SpO2 99% .  BMI Body mass index is 27.45 kg/(m^2).  Wt Readings from Last 3 Encounters:  01/11/15 188 lb 8 oz (85.503 kg)  06/29/14 184 lb 12.8 oz (83.825 kg)  02/05/14 192 lb 6.4 oz (87.272 kg)    General: Pleasant. Well developed, well nourished and in no acute distress. She has gained 4 pounds.  HEENT: Normal. Neck: Supple, no JVD, carotid bruits, or masses noted.  Cardiac: Regular rate and rhythm. No murmurs, rubs, or gallops. No edema.  Respiratory:  Lungs are clear to auscultation bilaterally with normal work of breathing.  GI: Soft and nontender.  MS: No deformity or atrophy. Gait and ROM intact. Skin: Warm and dry. Color is normal.  Neuro:  Strength and sensation are intact and no gross focal deficits noted.  Psych: Alert, appropriate and with normal affect.   LABORATORY DATA:  EKG:  EKG is not ordered today.   Lab Results  Component Value Date   WBC 5.6 09/21/2011   HGB 14.3 09/21/2011   HCT 41.9 09/21/2011   PLT 282.0 09/21/2011   GLUCOSE 83 06/29/2014   CHOL 154 06/29/2014   TRIG 75.0 06/29/2014   HDL 43.20 06/29/2014   LDLCALC 96 06/29/2014   ALT 16 06/29/2014   AST 22 06/29/2014   NA 133* 06/29/2014   K 3.8 06/29/2014   CL 98 06/29/2014   CREATININE 0.8 06/29/2014   BUN 15 06/29/2014    CO2 29 06/29/2014    BNP (last 3 results) No results for input(s): BNP in the last 8760 hours.  ProBNP (last 3 results) No results for input(s): PROBNP in the last 8760 hours.   Other Studies Reviewed Today:  Carotid Duplex Notes Recorded by Wellington Hampshire, MD on 12/16/2014 at  12:54 PM Carotid Doppler showed minor atherosclerosis. The velocities are actually better than last year. Recommend repeat carotid Doppler in 2 years.    Assessment/Plan: 1. HTN - BP is great - continue with current regimen.   2. Situational stress - back on her Zoloft daily. She is definitely improved.  3. Carotid disease - recent recheck - Dr. Fletcher Anon has advised to recheck in 2 years  Current medicines are reviewed with the patient today.  The patient does not have concerns regarding medicines other than what has been noted above.  The following changes have been made:  See above.  Labs/ tests ordered today include:    Orders Placed This Encounter  Procedures  . Basic metabolic panel  . Hepatic function panel  . Lipid panel     Disposition:   FU with me in 6 months  Patient is agreeable to this plan and will call if any problems develop in the interim.   Signed: Burtis Junes, RN, ANP-C 01/11/2015 11:07 AM  [place] [address] [place], [place]  [address] Phone: [phone] Fax: [phone]

## 2015-01-11 NOTE — Patient Instructions (Signed)
We will be checking the following labs today BMET, HPF, & Lipids  Stay on your current medicines  See me in 6 months  Call the [place] office at [phone] if you have any questions, problems or concerns.

## 2015-01-14 ENCOUNTER — Other Ambulatory Visit: Payer: Self-pay

## 2015-01-14 DIAGNOSIS — I6523 Occlusion and stenosis of bilateral carotid arteries: Secondary | ICD-10-CM

## 2015-01-14 DIAGNOSIS — E785 Hyperlipidemia, unspecified: Secondary | ICD-10-CM

## 2015-02-12 ENCOUNTER — Other Ambulatory Visit: Payer: Self-pay

## 2015-02-12 DIAGNOSIS — Z1231 Encounter for screening mammogram for malignant neoplasm of breast: Secondary | ICD-10-CM

## 2015-02-21 DIAGNOSIS — H52223 Regular astigmatism, bilateral: Secondary | ICD-10-CM | POA: Diagnosis not present

## 2015-02-21 DIAGNOSIS — H5213 Myopia, bilateral: Secondary | ICD-10-CM | POA: Diagnosis not present

## 2015-02-21 DIAGNOSIS — H2513 Age-related nuclear cataract, bilateral: Secondary | ICD-10-CM | POA: Diagnosis not present

## 2015-03-01 DIAGNOSIS — R8290 Unspecified abnormal findings in urine: Secondary | ICD-10-CM | POA: Diagnosis not present

## 2015-03-01 DIAGNOSIS — I2510 Atherosclerotic heart disease of native coronary artery without angina pectoris: Secondary | ICD-10-CM | POA: Diagnosis not present

## 2015-03-01 DIAGNOSIS — I10 Essential (primary) hypertension: Secondary | ICD-10-CM | POA: Diagnosis not present

## 2015-03-01 DIAGNOSIS — M859 Disorder of bone density and structure, unspecified: Secondary | ICD-10-CM | POA: Diagnosis not present

## 2015-03-01 DIAGNOSIS — M8580 Other specified disorders of bone density and structure, unspecified site: Secondary | ICD-10-CM | POA: Diagnosis not present

## 2015-03-01 DIAGNOSIS — E785 Hyperlipidemia, unspecified: Secondary | ICD-10-CM | POA: Diagnosis not present

## 2015-03-01 DIAGNOSIS — N390 Urinary tract infection, site not specified: Secondary | ICD-10-CM | POA: Diagnosis not present

## 2015-03-08 DIAGNOSIS — L729 Follicular cyst of the skin and subcutaneous tissue, unspecified: Secondary | ICD-10-CM | POA: Diagnosis not present

## 2015-03-08 DIAGNOSIS — Z0000 Encounter for general adult medical examination without abnormal findings: Secondary | ICD-10-CM | POA: Diagnosis not present

## 2015-03-08 DIAGNOSIS — I6529 Occlusion and stenosis of unspecified carotid artery: Secondary | ICD-10-CM | POA: Diagnosis not present

## 2015-03-08 DIAGNOSIS — I10 Essential (primary) hypertension: Secondary | ICD-10-CM | POA: Diagnosis not present

## 2015-03-08 DIAGNOSIS — N390 Urinary tract infection, site not specified: Secondary | ICD-10-CM | POA: Diagnosis not present

## 2015-03-08 DIAGNOSIS — E785 Hyperlipidemia, unspecified: Secondary | ICD-10-CM | POA: Diagnosis not present

## 2015-03-08 DIAGNOSIS — Z1389 Encounter for screening for other disorder: Secondary | ICD-10-CM | POA: Diagnosis not present

## 2015-03-08 DIAGNOSIS — K219 Gastro-esophageal reflux disease without esophagitis: Secondary | ICD-10-CM | POA: Diagnosis not present

## 2015-03-08 DIAGNOSIS — K635 Polyp of colon: Secondary | ICD-10-CM | POA: Diagnosis not present

## 2015-03-08 DIAGNOSIS — M62838 Other muscle spasm: Secondary | ICD-10-CM | POA: Diagnosis not present

## 2015-03-08 DIAGNOSIS — Z6829 Body mass index (BMI) 29.0-29.9, adult: Secondary | ICD-10-CM | POA: Diagnosis not present

## 2015-03-08 DIAGNOSIS — K5900 Constipation, unspecified: Secondary | ICD-10-CM | POA: Diagnosis not present

## 2015-03-12 DIAGNOSIS — M859 Disorder of bone density and structure, unspecified: Secondary | ICD-10-CM | POA: Diagnosis not present

## 2015-03-19 DIAGNOSIS — Z1212 Encounter for screening for malignant neoplasm of rectum: Secondary | ICD-10-CM | POA: Diagnosis not present

## 2015-03-21 ENCOUNTER — Ambulatory Visit: Payer: Medicare Other

## 2015-04-15 ENCOUNTER — Other Ambulatory Visit: Payer: Self-pay

## 2015-04-17 DIAGNOSIS — R319 Hematuria, unspecified: Secondary | ICD-10-CM | POA: Diagnosis not present

## 2015-04-17 DIAGNOSIS — R350 Frequency of micturition: Secondary | ICD-10-CM | POA: Diagnosis not present

## 2015-04-24 ENCOUNTER — Other Ambulatory Visit: Payer: Self-pay

## 2015-04-24 MED ORDER — PRAVASTATIN SODIUM 40 MG PO TABS: ORAL_TABLET | ORAL | Status: DC

## 2015-04-25 ENCOUNTER — Other Ambulatory Visit: Payer: Self-pay | Admitting: *Deleted

## 2015-04-25 ENCOUNTER — Ambulatory Visit
Admission: RE | Admit: 2015-04-25 | Discharge: 2015-04-25 | Disposition: A | Discharge status: Home / Self Care | Payer: Medicare Other | Source: Ambulatory Visit

## 2015-04-25 DIAGNOSIS — Z1231 Encounter for screening mammogram for malignant neoplasm of breast: Secondary | ICD-10-CM

## 2015-04-25 MED ORDER — PRAVASTATIN SODIUM 40 MG PO TABS: ORAL_TABLET | ORAL | Status: DC

## 2015-04-25 NOTE — Telephone Encounter (Signed)
Refill completed.

## 2015-04-30 ENCOUNTER — Other Ambulatory Visit: Payer: Self-pay | Admitting: Nurse Practitioner

## 2015-07-08 ENCOUNTER — Other Ambulatory Visit (INDEPENDENT_AMBULATORY_CARE_PROVIDER_SITE_OTHER): Payer: Medicare Other | Admitting: *Deleted

## 2015-07-08 DIAGNOSIS — E785 Hyperlipidemia, unspecified: Secondary | ICD-10-CM | POA: Diagnosis not present

## 2015-07-08 DIAGNOSIS — I6523 Occlusion and stenosis of bilateral carotid arteries: Secondary | ICD-10-CM

## 2015-07-08 LAB — BASIC METABOLIC PANEL
BUN: 10 mg/dL (ref 6–23)
CO2: 27 mEq/L (ref 19–32)
Calcium: 9.3 mg/dL (ref 8.4–10.5)
Chloride: 100 mEq/L (ref 96–112)
Creatinine, Ser: 0.71 mg/dL (ref 0.40–1.20)
GFR: 86.7 mL/min (ref >60.00)
Glucose, Bld: 92 mg/dL (ref 70–99)
Potassium: 3.7 mEq/L (ref 3.5–5.1)
Sodium: 136 mEq/L (ref 135–145)

## 2015-07-08 LAB — LIPID PANEL
Cholesterol: 144 mg/dL (ref 0–200)
HDL: 49.7 mg/dL (ref >39.00)
LDL Cholesterol: 76 mg/dL (ref 0–99)
NonHDL: 93.99
Total CHOL/HDL Ratio: 3
Triglycerides: 89 mg/dL (ref 0.0–149.0)
VLDL: 17.8 mg/dL (ref 0.0–40.0)

## 2015-07-08 LAB — HEPATIC FUNCTION PANEL
ALT: 15 U/L (ref 0–35)
AST: 22 U/L (ref 0–37)
Albumin: 3.9 g/dL (ref 3.5–5.2)
Alkaline Phosphatase: 66 U/L (ref 39–117)
Bilirubin, Direct: 0.1 mg/dL (ref 0.0–0.3)
Total Bilirubin: 0.5 mg/dL (ref 0.2–1.2)
Total Protein: 6.7 g/dL (ref 6.0–8.3)

## 2015-08-06 ENCOUNTER — Encounter: Payer: Self-pay | Admitting: Nurse Practitioner

## 2015-08-06 ENCOUNTER — Ambulatory Visit (INDEPENDENT_AMBULATORY_CARE_PROVIDER_SITE_OTHER): Payer: Medicare Other | Admitting: Nurse Practitioner

## 2015-08-06 VITALS — BP 136/80 | HR 65 | Ht 69.0 in | Wt 189.8 lb

## 2015-08-06 DIAGNOSIS — E785 Hyperlipidemia, unspecified: Secondary | ICD-10-CM | POA: Diagnosis not present

## 2015-08-06 DIAGNOSIS — R002 Palpitations: Secondary | ICD-10-CM | POA: Diagnosis not present

## 2015-08-06 DIAGNOSIS — I6529 Occlusion and stenosis of unspecified carotid artery: Secondary | ICD-10-CM

## 2015-08-06 DIAGNOSIS — I10 Essential (primary) hypertension: Secondary | ICD-10-CM

## 2015-08-06 DIAGNOSIS — I517 Cardiomegaly: Secondary | ICD-10-CM | POA: Diagnosis not present

## 2015-08-06 MED ORDER — SERTRALINE HCL 50 MG PO TABS: 25.0000 mg | ORAL_TABLET | Freq: Every day | ORAL | Status: DC

## 2015-08-06 MED ORDER — CLONIDINE HCL 0.1 MG PO TABS: 0.1000 mg | ORAL_TABLET | Freq: Two times a day (BID) | ORAL | Status: DC | PRN

## 2015-08-06 NOTE — Patient Instructions (Addendum)
We will be checking the following labs today - NONE   Medication Instructions:    Continue with your current medicines.   I refilled the clonidine and Zoloft today    Testing/Procedures To Be Arranged:  N/A  Follow-Up:   See me in 6 months    Other Special Instructions:   N/A  Call the [place] office at [phone] if you have any questions, problems or concerns.

## 2015-08-06 NOTE — Progress Notes (Signed)
CARDIOLOGY OFFICE NOTE  Date:  08/06/2015    Carla Lewis Date of Birth: 08/07/1946 Medical Record #[id number]  PCP:  Tivis Ringer, MD  Cardiologist:  [place]    Chief Complaint  Patient presents with  . Hyperlipidemia    6 month check - seen for Dr. [place]  . Hypertension    History of Present Illness: Carla Lewis is a 69 y.o. female who presents today for a 6 month check. Seen for Dr. [place]. She has HTN with LVH. She is intolerant to Bystolic and to Diovan.   Other issues include HLD, GERD, allergies and depression/anxiety. Dr. Dagmar Hait follows her labs. She has minimal nonobstructive plaque on carotid duplex from February of 2015 and has seen Dr. Fletcher Anon for review. She had a repeat carotid Doppler in February of 2015 which showed mild atherosclerosis in the left side with less than 39% stenosis and increased velocity on the right side suggestive of 60-79% stenosis although no obvious stenosis was noted. Last carotids from 12/2014 actually improved and he recommended repeat study in 2018. Last Myoview was from 2010.   Seen back in March of 2015 for labile BP. More stress and anxiety issues. Had called the on call staff and put on Clonidine which caused orthostasis - I changed that to just prn use and increased her Lisinopril HCT and had her start back on her Zoloft QD (had been taking just prn). Her BP improved and she was doing fine on her return OV.  I last saw her back in March of 2016 - she was doing well but having difficulty trying to stay on track with taking care of herself.    Comes back today. Here alone. Doing fine. Using prn clonidine when BP too high - pretty rare occasion. BP for the most part is ok. No chest pain. Breathing is ok. Still worried about her carotids. Not as active and has stopped walking regularly.   Past Medical History  Diagnosis Date  . Obesity   . GERD (gastroesophageal reflux disease)   . Palpitations   . Anxiety   .  Hypertension   . Ankle bruise     SPONTANEOUS BRUISE ON RIGHT ANKLE  . Hyperlipidemia   . Liver cyst   . LVH (left ventricular hypertrophy)     MILD LVH per echo in 2007  . Mitral valve regurgitation     Past Surgical History  Procedure Laterality Date  . Breast lumpectomy  1995    RIGHT BREAST  . US echocardiography  2007    SHOWED EF OF 55-60%  . Cholecystectomy       Medications: Current Outpatient Prescriptions  Medication Sig Dispense Refill  . APPLE CIDER VINEGAR PO Take by mouth daily.    Marland Kitchen aspirin 81 MG tablet Take 81 mg by mouth daily.      . cetirizine (ZYRTEC) 10 MG tablet Take 10 mg by mouth daily.    . cloNIDine (CATAPRES) 0.1 MG tablet Take 1 tablet (0.1 mg total) by mouth 2 (two) times daily as needed (for systolic B/P greater than 160). 20 tablet 6  . Coenzyme Q10 (COQ10) 100 MG CAPS Take by mouth daily.      Marland Kitchen docusate sodium (COLACE) 100 MG capsule Take 200 mg by mouth daily.    . Glucosamine-Chondroitin (MOVE FREE PO) Take 1 capsule by mouth daily.     Marland Kitchen lisinopril-hydrochlorothiazide (PRINZIDE,ZESTORETIC) 20-25 MG per tablet TAKE 1 TABLET BY MOUTH DAILY. 90 tablet 1  .  Omega-3 Krill Oil 500 MG CAPS Take 500 mg by mouth daily.    Marland Kitchen omeprazole (PRILOSEC OTC) 20 MG tablet Take 20 mg by mouth daily.    . pravastatin (PRAVACHOL) 40 MG tablet TAKE 1 TABLET (40 MG TOTAL) BY MOUTH DAILY. 30 tablet 6  . sertraline (ZOLOFT) 50 MG tablet Take 0.5 tablets (25 mg total) by mouth daily. 30 tablet 11  . Wheat Dextrin (BENEFIBER PO) Take by mouth daily. TAKE 2 DAILY     No current facility-administered medications for this visit.    Allergies: Allergies  Allergen Reactions  . Bystolic [Nebivolol Hcl]     No energy and lightheadedness.  . Codeine   . Diovan [Valsartan]     Anxiety  . Sulfa Drugs Cross Reactors     Social History: The patient  reports that she has never smoked. She does not have any smokeless tobacco history on file. She reports that she does not  drink alcohol or use illicit drugs.   Family History: The patient's family history includes Heart disease (age of onset: 2) in her father; Hypertension (age of onset: 38) in her father.   Review of Systems: Please see the history of present illness.   Otherwise, the review of systems is positive for none.   All other systems are reviewed and negative.   Physical Exam: VS:  BP 136/80 mmHg  Pulse 65  Ht 5\' 9"  (1.753 m)  Wt 189 lb 12.8 oz (86.093 kg)  BMI 28.02 kg/m2 .  BMI Body mass index is 28.02 kg/(m^2).  Wt Readings from Last 3 Encounters:  08/06/15 189 lb 12.8 oz (86.093 kg)  01/11/15 188 lb 8 oz (85.503 kg)  06/29/14 184 lb 12.8 oz (83.825 kg)    General: Pleasant. Well developed, well nourished and in no acute distress.  HEENT: Normal. Neck: Supple, no JVD, carotid bruits, or masses noted.  Cardiac: Regular rate and rhythm. No murmurs, rubs, or gallops. No edema.  Respiratory:  Lungs are clear to auscultation bilaterally with normal work of breathing.  GI: Soft and nontender.  MS: No deformity or atrophy. Gait and ROM intact. Skin: Warm and dry. Color is normal.  Neuro:  Strength and sensation are intact and no gross focal deficits noted.  Psych: Alert, appropriate and with normal affect.   LABORATORY DATA:  EKG:  EKG is ordered today. This demonstrates NSR with LAE.  Lab Results  Component Value Date   WBC 5.6 09/21/2011   HGB 14.3 09/21/2011   HCT 41.9 09/21/2011   PLT 282.0 09/21/2011   GLUCOSE 92 07/08/2015   CHOL 144 07/08/2015   TRIG 89.0 07/08/2015   HDL 49.70 07/08/2015   LDLCALC 76 07/08/2015   ALT 15 07/08/2015   AST 22 07/08/2015   NA 136 07/08/2015   K 3.7 07/08/2015   CL 100 07/08/2015   CREATININE 0.71 07/08/2015   BUN 10 07/08/2015   CO2 27 07/08/2015    BNP (last 3 results) No results for input(s): BNP in the last 8760 hours.  ProBNP (last 3 results) No results for input(s): PROBNP in the last 8760 hours.   Other Studies Reviewed  Today:  Notes Recorded by Wellington Hampshire, MD on 12/16/2014 at 12:54 PM Carotid Doppler showed minor atherosclerosis. The velocities are actually better than last year. Recommend repeat carotid Doppler in 2 years.    Assessment/Plan: 1. HTN - BP is ok - continue with current regimen. Using clonidine just prn.   2. Situational stress -  chronic issue - back on her Zoloft daily.   3. Carotid disease - Dr. Fletcher Anon has advised to recheck in 2 years - due in 2018  4. HLD - on statin therapy - recent labs stable from last month reviewed.   Current medicines are reviewed with the patient today.  The patient does not have concerns regarding medicines other than what has been noted above.  The following changes have been made:  See above.  Labs/ tests ordered today include:    Orders Placed This Encounter  Procedures  . EKG 12-Lead     Disposition:   FU with me in 6 months.   Patient is agreeable to this plan and will call if any problems develop in the interim.   Signed: Burtis Junes, RN, ANP-C 08/06/2015 11:38 AM  [facility] [address] [place], [place]  [address] Phone: [phone] Fax: [phone]

## 2015-09-05 DIAGNOSIS — M25572 Pain in left ankle and joints of left foot: Secondary | ICD-10-CM | POA: Diagnosis not present

## 2015-11-03 ENCOUNTER — Other Ambulatory Visit: Payer: Self-pay | Admitting: Nurse Practitioner

## 2016-02-05 ENCOUNTER — Ambulatory Visit (INDEPENDENT_AMBULATORY_CARE_PROVIDER_SITE_OTHER): Payer: Medicare Other | Admitting: Nurse Practitioner

## 2016-02-05 ENCOUNTER — Encounter: Payer: Self-pay | Admitting: Nurse Practitioner

## 2016-02-05 VITALS — BP 150/80 | HR 64 | Ht 69.0 in | Wt 193.8 lb

## 2016-02-05 DIAGNOSIS — E785 Hyperlipidemia, unspecified: Secondary | ICD-10-CM | POA: Diagnosis not present

## 2016-02-05 DIAGNOSIS — I10 Essential (primary) hypertension: Secondary | ICD-10-CM

## 2016-02-05 DIAGNOSIS — R002 Palpitations: Secondary | ICD-10-CM | POA: Diagnosis not present

## 2016-02-05 LAB — HEPATIC FUNCTION PANEL
ALT: 18 U/L (ref 6–29)
AST: 23 U/L (ref 10–35)
Albumin: 4.1 g/dL (ref 3.6–5.1)
Alkaline Phosphatase: 62 U/L (ref 33–130)
Bilirubin, Direct: 0.1 mg/dL (ref <=0.2)
Indirect Bilirubin: 0.4 mg/dL (ref 0.2–1.2)
Total Bilirubin: 0.5 mg/dL (ref 0.2–1.2)
Total Protein: 6.5 g/dL (ref 6.1–8.1)

## 2016-02-05 LAB — LIPID PANEL
Cholesterol: 139 mg/dL (ref 125–200)
HDL: 54 mg/dL (ref >=46)
LDL Cholesterol: 71 mg/dL (ref <130)
Total CHOL/HDL Ratio: 2.6 Ratio (ref <=5.0)
Triglycerides: 68 mg/dL (ref <150)
VLDL: 14 mg/dL (ref <30)

## 2016-02-05 LAB — BASIC METABOLIC PANEL
BUN: 13 mg/dL (ref 7–25)
CO2: 26 mmol/L (ref 20–31)
Calcium: 8.9 mg/dL (ref 8.6–10.4)
Chloride: 99 mmol/L (ref 98–110)
Creat: 0.76 mg/dL (ref 0.50–0.99)
Glucose, Bld: 80 mg/dL (ref 65–99)
Potassium: 4.7 mmol/L (ref 3.5–5.3)
Sodium: 133 mmol/L — ABNORMAL LOW (ref 135–146)

## 2016-02-05 NOTE — Patient Instructions (Addendum)
We will be checking the following labs today - BMET, HPF and lipids   Medication Instructions:    Continue with your current medicines.   Ok to hold the Pravastatin for 2 weeks - if this helps your aching - let us know - otherwise - restart.    Testing/Procedures To Be Arranged:  N/A  Follow-Up:   See me in 6 months.     Other Special Instructions:   Get back to walking/working on your weight, etc.   Get back to checking your BP for me    If you need a refill on your cardiac medications before your next appointment, please call your pharmacy.   Call the [facility] office at [phone] if you have any questions, problems or concerns.

## 2016-02-05 NOTE — Progress Notes (Signed)
CARDIOLOGY OFFICE NOTE  Date:  02/05/2016    Carla Lewis Date of Birth: 1945/12/28 Medical Record [id number]  PCP:  Tivis Ringer, MD  Cardiologist:  [place]    Chief Complaint  Patient presents with  . Hyperlipidemia  . Hypertension    6 month check - seen for Dr. [place]    History of Present Illness: Carla Lewis is a 70 y.o. female who presents today for a follow up visit. Seen for Dr. [place]. Former patient of Dr. Susa Simmonds.   She has HTN with LVH. She is intolerant to Bystolic and to Diovan.   Other issues include HLD, GERD, allergies and depression/anxiety. Dr. Dagmar Hait usually follows her labs. She has minimal nonobstructive plaque on carotid duplex from February of 2015 and has seen Dr. Fletcher Anon for review. She had a repeat carotid Doppler in February of 2015 which showed mild atherosclerosis in the left side with less than 39% stenosis and increased velocity on the right side suggestive of 60-79% stenosis although no obvious stenosis was noted. Last carotids from 12/2014 actually improved and he recommended repeat study in 2018. Last Myoview was from 2010.   Seen back in March of 2015 for labile BP. More stress and anxiety issues. Had called the on call staff and put on Clonidine which caused orthostasis - I changed that to just prn use and increased her Lisinopril HCT and had her start back on her Zoloft QD (had been taking just prn). Her BP improved and she was doing fine on her return OV.  I have seen her back several times since - she tends to have trouble sticking to a plan that involves taking care of herself. Cardiac status has been ok.   Comes back today. Here alone. Doing fine. Fasting today for labs here. BP little high here today. Not checking at home. Not as active. Has gained weight. No chest pain. Not short of breath. No medicines yet today. No use of Clonidine since last visit.   Past Medical History  Diagnosis Date  . Obesity   . GERD  (gastroesophageal reflux disease)   . Palpitations   . Anxiety   . Hypertension   . Ankle bruise     SPONTANEOUS BRUISE ON RIGHT ANKLE  . Hyperlipidemia   . Liver cyst   . LVH (left ventricular hypertrophy)     MILD LVH per echo in 2007  . Mitral valve regurgitation     Past Surgical History  Procedure Laterality Date  . Breast lumpectomy  1995    RIGHT BREAST  . US echocardiography  2007    SHOWED EF OF 55-60%  . Cholecystectomy       Medications: Current Outpatient Prescriptions  Medication Sig Dispense Refill  . APPLE CIDER VINEGAR PO Take by mouth daily.    Marland Kitchen aspirin 81 MG tablet Take 81 mg by mouth daily.      . cetirizine (ZYRTEC) 10 MG tablet Take 10 mg by mouth daily.    . cloNIDine (CATAPRES) 0.1 MG tablet Take 1 tablet (0.1 mg total) by mouth 2 (two) times daily as needed (for systolic B/P greater than 160). 20 tablet 6  . Coenzyme Q10 (COQ10) 100 MG CAPS Take by mouth daily.      Marland Kitchen docusate sodium (COLACE) 100 MG capsule Take 200 mg by mouth daily.    . Glucosamine-Chondroitin (MOVE FREE PO) Take 1 capsule by mouth daily.     Marland Kitchen lisinopril-hydrochlorothiazide (PRINZIDE,ZESTORETIC) 20-25 MG tablet TAKE  1 TABLET BY MOUTH EVERY DAY 90 tablet 1  . Omega-3 Krill Oil 500 MG CAPS Take 500 mg by mouth daily.    Marland Kitchen omeprazole (PRILOSEC OTC) 20 MG tablet Take 20 mg by mouth daily.    . pravastatin (PRAVACHOL) 40 MG tablet Take 20 mg by mouth 2 (two) times daily.    . sertraline (ZOLOFT) 50 MG tablet Take 0.5 tablets (25 mg total) by mouth daily. 30 tablet 11  . Wheat Dextrin (BENEFIBER PO) Take by mouth daily. TAKE 2 DAILY     No current facility-administered medications for this visit.    Allergies: Allergies  Allergen Reactions  . Bystolic [Nebivolol Hcl]     No energy and lightheadedness.  . Codeine   . Diovan [Valsartan]     Anxiety  . Sulfa Drugs Cross Reactors     Social History: The patient  reports that she has never smoked. She does not have any  smokeless tobacco history on file. She reports that she does not drink alcohol or use illicit drugs.   Family History: The patient's family history includes Heart disease (age of onset: 55) in her father; Hypertension (age of onset: 60) in her father.   Review of Systems: Please see the history of present illness.   Otherwise, the review of systems is positive for none.   All other systems are reviewed and negative.   Physical Exam: VS:  BP 150/80 mmHg  Pulse 64  Ht 5\' 9"  (1.753 m)  Wt 193 lb 12.8 oz (87.907 kg)  BMI 28.61 kg/m2 .  BMI Body mass index is 28.61 kg/(m^2).  Wt Readings from Last 3 Encounters:  02/05/16 193 lb 12.8 oz (87.907 kg)  08/06/15 189 lb 12.8 oz (86.093 kg)  01/11/15 188 lb 8 oz (85.503 kg)    General: Pleasant. Well developed, well nourished and in no acute distress. Weight is up 4 pounds.  HEENT: Normal. Neck: Supple, no JVD, carotid bruits, or masses noted.  Cardiac: Regular rate and rhythm. No murmurs, rubs, or gallops. No edema.  Respiratory:  Lungs are clear to auscultation bilaterally with normal work of breathing.  GI: Soft and nontender.  MS: No deformity or atrophy. Gait and ROM intact. Skin: Warm and dry. Color is normal.  Neuro:  Strength and sensation are intact and no gross focal deficits noted.  Psych: Alert, appropriate and with normal affect.   LABORATORY DATA:  EKG:  EKG is not ordered today.  Lab Results  Component Value Date   WBC 5.6 09/21/2011   HGB 14.3 09/21/2011   HCT 41.9 09/21/2011   PLT 282.0 09/21/2011   GLUCOSE 92 07/08/2015   CHOL 144 07/08/2015   TRIG 89.0 07/08/2015   HDL 49.70 07/08/2015   LDLCALC 76 07/08/2015   ALT 15 07/08/2015   AST 22 07/08/2015   NA 136 07/08/2015   K 3.7 07/08/2015   CL 100 07/08/2015   CREATININE 0.71 07/08/2015   BUN 10 07/08/2015   CO2 27 07/08/2015    BNP (last 3 results) No results for input(s): BNP in the last 8760 hours.  ProBNP (last 3 results) No results for input(s):  PROBNP in the last 8760 hours.   Other Studies Reviewed Today:  Notes Recorded by Wellington Hampshire, MD on 12/16/2014 at 12:54 PM Carotid Doppler showed minor atherosclerosis. The velocities are actually better than last year. Recommend repeat carotid Doppler in 2 years.    Assessment/Plan: 1. HTN - BP is fair - she needs to  get back to checking BP at home - continue with current regimen for now. Using clonidine just prn.   2. Situational stress - chronic issue - back on her Zoloft daily.   3. Carotid disease - Dr. Fletcher Anon has advised to recheck in 2 years - due in 2018  4. HLD - on statin therapy - recent labs stable from last month reviewed. She is having aching - she would like a drug holiday - will giver her a 2 week statin free holiday. Regardless, needs to get back to CV risk factor modification.  Current medicines are reviewed with the patient today.  The patient does not have concerns regarding medicines other than what has been noted above.  The following changes have been made:  See above.  Labs/ tests ordered today include:    Orders Placed This Encounter  Procedures  . Basic metabolic panel  . Hepatic function panel  . Lipid panel     Disposition:   FU with me in 6 months.   Patient is agreeable to this plan and will call if any problems develop in the interim.   Signed: Burtis Junes, RN, ANP-C 02/05/2016 11:51 AM  [facility] [address] [place], [place]  [address] Phone: [phone] Fax: [phone]

## 2016-02-19 DIAGNOSIS — H43393 Other vitreous opacities, bilateral: Secondary | ICD-10-CM | POA: Diagnosis not present

## 2016-02-19 DIAGNOSIS — H524 Presbyopia: Secondary | ICD-10-CM | POA: Diagnosis not present

## 2016-02-19 DIAGNOSIS — H52223 Regular astigmatism, bilateral: Secondary | ICD-10-CM | POA: Diagnosis not present

## 2016-02-19 DIAGNOSIS — H5213 Myopia, bilateral: Secondary | ICD-10-CM | POA: Diagnosis not present

## 2016-03-02 ENCOUNTER — Other Ambulatory Visit: Payer: Self-pay | Admitting: *Deleted

## 2016-03-02 ENCOUNTER — Telehealth: Payer: Self-pay | Admitting: *Deleted

## 2016-03-02 NOTE — Telephone Encounter (Signed)
Ok to cut down to just one a day. Recheck her lab - lipids and LFTs in 3 months.  Careful diet/weight/etc.

## 2016-03-02 NOTE — Telephone Encounter (Signed)
S/W pt started back on pravastatin (20 mg ) bid on May 8 after being off medication for two weeks.  Had really bad leg cramps Friday and Saturday night. Pt stated when looked up side effects number 1 cause is muscle aches.  Stated that is different than leg cramps.  Pt is leaving for a trip to [place] wants to know if pt can go down to one tablet daily ( 20 mg ). Will send to [facility] to [place]

## 2016-03-02 NOTE — Telephone Encounter (Signed)
please call her regarding the convo about pravastatin

## 2016-03-02 NOTE — Telephone Encounter (Signed)
S/w pt will cut back pravastatin ( 20 mg ) daily and will call back next week if still having problems. Stated walked three miles today. Pt has appointment with Dr. Dagmar Hait on July 11th and will ask PCP office to check lipid and liver and send [place] results. Change medication on med list.

## 2016-03-16 DIAGNOSIS — Z882 Allergy status to sulfonamides status: Secondary | ICD-10-CM | POA: Diagnosis not present

## 2016-03-16 DIAGNOSIS — H6691 Otitis media, unspecified, right ear: Secondary | ICD-10-CM | POA: Diagnosis not present

## 2016-03-16 DIAGNOSIS — J069 Acute upper respiratory infection, unspecified: Secondary | ICD-10-CM | POA: Diagnosis not present

## 2016-03-23 ENCOUNTER — Other Ambulatory Visit: Payer: Self-pay | Admitting: Internal Medicine

## 2016-03-23 DIAGNOSIS — Z1231 Encounter for screening mammogram for malignant neoplasm of breast: Secondary | ICD-10-CM

## 2016-03-24 DIAGNOSIS — H6980 Other specified disorders of Eustachian tube, unspecified ear: Secondary | ICD-10-CM | POA: Diagnosis not present

## 2016-04-08 DIAGNOSIS — H903 Sensorineural hearing loss, bilateral: Secondary | ICD-10-CM | POA: Diagnosis not present

## 2016-04-08 DIAGNOSIS — H9203 Otalgia, bilateral: Secondary | ICD-10-CM | POA: Diagnosis not present

## 2016-04-08 DIAGNOSIS — H9193 Unspecified hearing loss, bilateral: Secondary | ICD-10-CM | POA: Diagnosis not present

## 2016-04-28 DIAGNOSIS — E785 Hyperlipidemia, unspecified: Secondary | ICD-10-CM | POA: Diagnosis not present

## 2016-04-28 DIAGNOSIS — I10 Essential (primary) hypertension: Secondary | ICD-10-CM | POA: Diagnosis not present

## 2016-04-28 DIAGNOSIS — R8299 Other abnormal findings in urine: Secondary | ICD-10-CM | POA: Diagnosis not present

## 2016-04-28 DIAGNOSIS — M859 Disorder of bone density and structure, unspecified: Secondary | ICD-10-CM | POA: Diagnosis not present

## 2016-04-28 DIAGNOSIS — N390 Urinary tract infection, site not specified: Secondary | ICD-10-CM | POA: Diagnosis not present

## 2016-04-28 LAB — BASIC METABOLIC PANEL
BUN: 16 mg/dL (ref 4–21)
Creatinine: 0.8 mg/dL (ref <=1.1)
Glucose: 87 mg/dL
Potassium: 4.6 mmol/L (ref 3.4–5.3)
SODIUM: 136 mmol/L — AB (ref 137–147)

## 2016-04-28 LAB — TSH: TSH: 1.68 u[IU]/mL (ref <=5.90)

## 2016-04-28 LAB — ESTIMATED GFR: EGFR (Non-African Amer.): 70.9

## 2016-04-28 LAB — HEPATIC FUNCTION PANEL
ALT: 15 U/L (ref 7–35)
AST: 22 U/L (ref 13–35)
Alkaline Phosphatase: 67 U/L (ref 25–125)
Bilirubin, Total: 0.4 mg/dL

## 2016-04-28 LAB — LIPID PANEL
Cholesterol: 167 mg/dL (ref 0–200)
HDL: 47 mg/dL (ref 35–70)
LDL Cholesterol: 108 mg/dL
Triglycerides: 59 mg/dL (ref 40–160)

## 2016-04-28 LAB — CHLORIDE: CHLORIDE: 101 mmol/L

## 2016-04-28 LAB — VITAMIN D 25 HYDROXY (VIT D DEFICIENCY, FRACTURES): Vit D, 25-Hydroxy: 27.3

## 2016-04-28 LAB — CO2, TOTAL: Carbon Dioxide, Total: 28

## 2016-04-28 LAB — CBC AND DIFFERENTIAL
HEMATOCRIT: 41 % (ref 36–46)
HEMOGLOBIN: 13.7 g/dL (ref 12.0–16.0)
PLATELETS: 328 10*3/uL (ref 150–399)
WBC: 5.9 10^3/mL

## 2016-04-28 LAB — CALCIUM: Calcium: 8.9 mg/dL

## 2016-04-28 LAB — ALBUMIN: ALBUMIN SERUM: 3.5

## 2016-04-28 LAB — PROTEIN, TOTAL: Total Protein: 6.2 g/dL

## 2016-04-28 LAB — MICROALBUMIN, URINE: MICROALB UR: 19

## 2016-04-29 ENCOUNTER — Ambulatory Visit
Admission: RE | Admit: 2016-04-29 | Discharge: 2016-04-29 | Disposition: A | Discharge status: Home / Self Care | Payer: Medicare Other | Source: Ambulatory Visit | Attending: Internal Medicine | Admitting: Internal Medicine

## 2016-04-29 DIAGNOSIS — Z1231 Encounter for screening mammogram for malignant neoplasm of breast: Secondary | ICD-10-CM

## 2016-05-08 DIAGNOSIS — Z1212 Encounter for screening for malignant neoplasm of rectum: Secondary | ICD-10-CM | POA: Diagnosis not present

## 2016-05-11 ENCOUNTER — Telehealth: Payer: Self-pay | Admitting: Nurse Practitioner

## 2016-05-11 DIAGNOSIS — M810 Age-related osteoporosis without current pathological fracture: Secondary | ICD-10-CM | POA: Diagnosis not present

## 2016-05-11 DIAGNOSIS — I6529 Occlusion and stenosis of unspecified carotid artery: Secondary | ICD-10-CM | POA: Diagnosis not present

## 2016-05-11 DIAGNOSIS — Z1389 Encounter for screening for other disorder: Secondary | ICD-10-CM | POA: Diagnosis not present

## 2016-05-11 DIAGNOSIS — K5900 Constipation, unspecified: Secondary | ICD-10-CM | POA: Diagnosis not present

## 2016-05-11 DIAGNOSIS — K635 Polyp of colon: Secondary | ICD-10-CM | POA: Diagnosis not present

## 2016-05-11 DIAGNOSIS — Z0000 Encounter for general adult medical examination without abnormal findings: Secondary | ICD-10-CM | POA: Diagnosis not present

## 2016-05-11 DIAGNOSIS — E784 Other hyperlipidemia: Secondary | ICD-10-CM | POA: Diagnosis not present

## 2016-05-11 DIAGNOSIS — N390 Urinary tract infection, site not specified: Secondary | ICD-10-CM | POA: Diagnosis not present

## 2016-05-11 DIAGNOSIS — K219 Gastro-esophageal reflux disease without esophagitis: Secondary | ICD-10-CM | POA: Diagnosis not present

## 2016-05-11 DIAGNOSIS — J309 Allergic rhinitis, unspecified: Secondary | ICD-10-CM | POA: Diagnosis not present

## 2016-05-11 DIAGNOSIS — I10 Essential (primary) hypertension: Secondary | ICD-10-CM | POA: Diagnosis not present

## 2016-05-11 DIAGNOSIS — F419 Anxiety disorder, unspecified: Secondary | ICD-10-CM | POA: Diagnosis not present

## 2016-05-11 NOTE — Telephone Encounter (Signed)
New message      Pt saw her PCP today.  Question about her next carotid artery ultrasound and who will order it.  OK to call tomorrow.

## 2016-05-11 NOTE — Telephone Encounter (Signed)
Calling stating she saw Dr. Dagmar Hait today and he is requesting copy of her last carotid doppler study.  She also wanted to know when she should have next doppler done and who would order.  Sent results to Dr.Avva.  Advised her per last doppler in 2016 that she would be scheduled in 2018 and someone would call her to schedule.  Discussed the results again with her and she seemed more relieved.

## 2016-05-16 ENCOUNTER — Other Ambulatory Visit: Payer: Self-pay | Admitting: Cardiology

## 2016-05-18 NOTE — Telephone Encounter (Signed)
Rx request sent to pharmacy.  

## 2016-07-21 ENCOUNTER — Encounter: Payer: Self-pay | Admitting: Nurse Practitioner

## 2016-08-04 ENCOUNTER — Telehealth: Payer: Self-pay | Admitting: *Deleted

## 2016-08-04 ENCOUNTER — Encounter: Payer: Self-pay | Admitting: Nurse Practitioner

## 2016-08-04 ENCOUNTER — Ambulatory Visit (INDEPENDENT_AMBULATORY_CARE_PROVIDER_SITE_OTHER): Payer: Medicare Other | Admitting: Nurse Practitioner

## 2016-08-04 VITALS — BP 152/90 | HR 67 | Ht 68.75 in | Wt 190.4 lb

## 2016-08-04 DIAGNOSIS — I739 Peripheral vascular disease, unspecified: Secondary | ICD-10-CM

## 2016-08-04 DIAGNOSIS — I10 Essential (primary) hypertension: Secondary | ICD-10-CM | POA: Diagnosis not present

## 2016-08-04 DIAGNOSIS — I779 Disorder of arteries and arterioles, unspecified: Secondary | ICD-10-CM | POA: Diagnosis not present

## 2016-08-04 NOTE — Progress Notes (Signed)
CARDIOLOGY OFFICE NOTE  Date:  08/04/2016    Jearld [place] Date of Birth: 1945-11-06 Medical Record [id number]  PCP:  Tivis Ringer, MD  Cardiologist:  Dennies Coate & [place]  Chief Complaint  Patient presents with  . Hypertension  . Hyperlipidemia    6 month check - seen for Dr. [place]    History of Present Illness: Carla Lewis is a 70 y.o. female who presents today for a 6 month check. Seen for Dr. [place].   She has HTN with LVH. She is intolerant to Bystolic and to Diovan.   Other issues include HLD, GERD, allergies and depression/anxiety. Dr. Dagmar Hait usually follows her labs. She has minimal nonobstructive plaque on carotid duplex from February of 2015 and has seen Dr. Fletcher Anon for review. She had a repeat carotid Doppler in February of 2015 which showed mild atherosclerosis in the left side with less than 39% stenosis and increased velocity on the right side suggestive of 60-79% stenosis although no obvious stenosis was noted. Last carotids from 12/2014 actually improved and he recommended repeat study in 2018. Last Myoview was from 2010.   Seen back in March of 2015 for labile BP. More stress and anxiety issues. Had called the on call staff and put on Clonidine which caused orthostasis - I changed that to just prn use and increased her Lisinopril HCT and had her start back on her Zoloft QD (had been taking just prn). Her BP improved and she was doing fine on her return OV.  I have seen her back several times since - she tends to chronically have trouble sticking to a plan that involves taking care of herself. Cardiac status has been ok. Last seen back in April.   Comes back today. Here alone. Doing well. No chest pain. Not short of breath. Troubled more with vertigo. BP better at home. She had labs done by PCP back in July. May be switching her PCP due to location/proximity. Trying to remain active. BP better at home. Elevated here today. Feels better on half  dose statin therapy. Overall, she feels like she is doing ok.   Past Medical History:  Diagnosis Date  . Ankle bruise    SPONTANEOUS BRUISE ON RIGHT ANKLE  . Anxiety   . GERD (gastroesophageal reflux disease)   . Hyperlipidemia   . Hypertension   . Liver cyst   . LVH (left ventricular hypertrophy)    MILD LVH per echo in 2007  . Mitral valve regurgitation   . Obesity   . Palpitations     Past Surgical History:  Procedure Laterality Date  . BREAST LUMPECTOMY  1995   RIGHT BREAST  . CHOLECYSTECTOMY    . US ECHOCARDIOGRAPHY  2007   SHOWED EF OF 55-60%     Medications: Current Outpatient Prescriptions  Medication Sig Dispense Refill  . APPLE CIDER VINEGAR PO Take by mouth daily.    Marland Kitchen aspirin 81 MG tablet Take 81 mg by mouth daily.      . cetirizine (ZYRTEC) 10 MG tablet Take 10 mg by mouth daily.    . cloNIDine (CATAPRES) 0.1 MG tablet Take 1 tablet (0.1 mg total) by mouth 2 (two) times daily as needed (for systolic B/P greater than 160). 20 tablet 6  . Coenzyme Q10 (COQ10) 100 MG CAPS Take by mouth daily.      Marland Kitchen docusate sodium (COLACE) 100 MG capsule Take 200 mg by mouth daily.    . fluticasone (FLONASE) 50 MCG/ACT  nasal spray Place 2 sprays into both nostrils daily.     . Glucosamine-Chondroitin (MOVE FREE PO) Take 1 capsule by mouth daily.     Marland Kitchen lisinopril-hydrochlorothiazide (PRINZIDE,ZESTORETIC) 20-25 MG tablet TAKE 1 TABLET BY MOUTH EVERY DAY 90 tablet 1  . omeprazole (PRILOSEC OTC) 20 MG tablet Take 20 mg by mouth daily.    . pravastatin (PRAVACHOL) 40 MG tablet Take 20 mg by mouth daily.    . sertraline (ZOLOFT) 50 MG tablet Take 0.5 tablets (25 mg total) by mouth daily. 30 tablet 11  . Wheat Dextrin (BENEFIBER PO) Take by mouth daily. TAKE 2 DAILY     No current facility-administered medications for this visit.     Allergies: Allergies  Allergen Reactions  . Bystolic [Nebivolol Hcl]     No energy and lightheadedness.  . Codeine   . Diovan [Valsartan]      Anxiety  . Sulfa Drugs Cross Reactors     Social History: The patient  reports that she has never smoked. She has never used smokeless tobacco. She reports that she does not drink alcohol or use drugs.   Family History: The patient's family history includes Heart disease (age of onset: 36) in her father; Hypertension (age of onset: 107) in her father.   Review of Systems: Please see the history of present illness.   Otherwise, the review of systems is positive for none.   All other systems are reviewed and negative.   Physical Exam: VS:  BP (!) 152/90   Pulse 67   Ht 5' 8.75" (1.746 m)   Wt 190 lb 6.4 oz (86.4 kg)   BMI 28.32 kg/m  .  BMI Body mass index is 28.32 kg/m.  Wt Readings from Last 3 Encounters:  08/04/16 190 lb 6.4 oz (86.4 kg)  02/05/16 193 lb 12.8 oz (87.9 kg)  08/06/15 189 lb 12.8 oz (86.1 kg)   BP recheck by me is 170/80  General: Pleasant. Remains overweight. She is alert and in no acute distress.   HEENT: Normal.  Neck: Supple, no JVD, carotid bruits, or masses noted.  Cardiac: Regular rate and rhythm. No murmurs, rubs, or gallops. No edema.  Respiratory:  Lungs are clear to auscultation bilaterally with normal work of breathing.  GI: Soft and nontender.  MS: No deformity or atrophy. Gait and ROM intact.  Skin: Warm and dry. Color is normal.  Neuro:  Strength and sensation are intact and no gross focal deficits noted.  Psych: Alert, appropriate and with normal affect.   LABORATORY DATA:  EKG:  EKG is ordered today. This shows NSR.   Lab Results  Component Value Date   WBC 5.6 09/21/2011   HGB 14.3 09/21/2011   HCT 41.9 09/21/2011   PLT 282.0 09/21/2011   GLUCOSE 80 02/05/2016   CHOL 139 02/05/2016   TRIG 68 02/05/2016   HDL 54 02/05/2016   LDLCALC 71 02/05/2016   ALT 18 02/05/2016   AST 23 02/05/2016   NA 133 (L) 02/05/2016   K 4.7 02/05/2016   CL 99 02/05/2016   CREATININE 0.76 02/05/2016   BUN 13 02/05/2016   CO2 26 02/05/2016    BNP  (last 3 results) No results for input(s): BNP in the last 8760 hours.  ProBNP (last 3 results) No results for input(s): PROBNP in the last 8760 hours.   Other Studies Reviewed Today:  Notes Recorded by Wellington Hampshire, MD on 12/16/2014 at 12:54 PM Carotid Doppler showed minor atherosclerosis. The velocities are  actually better than last year. Recommend repeat carotid Doppler in 2 years.    Assessment/Plan: 1. HTN - BP is elevated here today - she reports better control at home - I have asked her to continue to monitor.   2. Situational stress - chronic issue - back on her Zoloft daily. Not really discussed today.   3. Carotid disease - Dr. Fletcher Anon has advised to recheck in 2 years - due in February 2018  4. HLD - on half dose statin therapy - has had labs with PCP back in July - will try to get a copy to review.   Current medicines are reviewed with the patient today.  The patient does not have concerns regarding medicines other than what has been noted above.  The following changes have been made:  See above.  Labs/ tests ordered today include:    Orders Placed This Encounter  Procedures  . EKG 12-Lead     Disposition:   FU with me in 6 months.   Patient is agreeable to this plan and will call if any problems develop in the interim.   Signed: Burtis Junes, RN, ANP-C 08/04/2016 10:50 AM  [facility] [address] [place], [place]  [address] Phone: [phone] Fax: [phone]

## 2016-08-04 NOTE — Patient Instructions (Addendum)
We will be checking the following labs today - NONE  We will call Dr. Danna Hefty office and get a copy of your labs   Medication Instructions:    Continue with your current medicines.     Testing/Procedures To Be Arranged:  Will need carotid dopplers at the end of February/first of March  Follow-Up:   See me in 6 months    Other Special Instructions:   Keep working on your weight  Keep checking your BP for me - let me know if it remains elevated    If you need a refill on your cardiac medications before your next appointment, please call your pharmacy.   Call the [place] office at [phone] if you have any questions, problems or concerns.

## 2016-08-04 NOTE — Telephone Encounter (Signed)
S/w  Angie @ Dr. Danna Hefty office @ [phone] will fax pt's recent lab results.

## 2016-08-11 ENCOUNTER — Encounter: Payer: Self-pay | Admitting: Family Medicine

## 2016-08-11 ENCOUNTER — Ambulatory Visit (INDEPENDENT_AMBULATORY_CARE_PROVIDER_SITE_OTHER): Payer: Medicare Other | Admitting: Family Medicine

## 2016-08-11 VITALS — BP 125/79 | HR 64 | Ht 68.75 in | Wt 182.3 lb

## 2016-08-11 DIAGNOSIS — K219 Gastro-esophageal reflux disease without esophagitis: Secondary | ICD-10-CM

## 2016-08-11 DIAGNOSIS — E559 Vitamin D deficiency, unspecified: Secondary | ICD-10-CM

## 2016-08-11 DIAGNOSIS — E782 Mixed hyperlipidemia: Secondary | ICD-10-CM

## 2016-08-11 DIAGNOSIS — I6523 Occlusion and stenosis of bilateral carotid arteries: Secondary | ICD-10-CM | POA: Diagnosis not present

## 2016-08-11 DIAGNOSIS — F419 Anxiety disorder, unspecified: Secondary | ICD-10-CM

## 2016-08-11 DIAGNOSIS — I10 Essential (primary) hypertension: Secondary | ICD-10-CM

## 2016-08-11 DIAGNOSIS — E663 Overweight: Secondary | ICD-10-CM | POA: Insufficient documentation

## 2016-08-11 DIAGNOSIS — J3089 Other allergic rhinitis: Secondary | ICD-10-CM | POA: Insufficient documentation

## 2016-08-11 DIAGNOSIS — R002 Palpitations: Secondary | ICD-10-CM

## 2016-08-11 NOTE — Assessment & Plan Note (Deleted)

## 2016-08-11 NOTE — Assessment & Plan Note (Addendum)
Dx 6 yrs or so.  Sees Cards for her BP and Chol Mgt per pt-  [name].  Pt states she prefers it that way  Pt initially went to cardiology years ago because of palpitations  And it was determined the palpitations were likely secondary to anxiety. Cardiac workup was negative.

## 2016-08-11 NOTE — Assessment & Plan Note (Signed)
zoloft works well ; stable

## 2016-08-11 NOTE — Assessment & Plan Note (Signed)
prilosec OTC; sx well controlled. Stable

## 2016-08-11 NOTE — Assessment & Plan Note (Addendum)
Has known PVCs.   Asymptomatic many years-  Approximately 4 ever since starting Zoloft.Found to be anxiety  Provoked

## 2016-08-11 NOTE — Assessment & Plan Note (Signed)
Has another one / in Feb 2018

## 2016-08-11 NOTE — Assessment & Plan Note (Addendum)
Well controlled.    Told her she can come to me q 4 months and go to Cards once yrly if OK with them.  If there are problems/ concerns we can always send her over sooner.

## 2016-08-11 NOTE — Patient Instructions (Signed)

## 2016-08-11 NOTE — Progress Notes (Signed)
New patient office visit note:  Impression and Recommendations:    1. Essential hypertension   2. Mixed hyperlipidemia   3. Bilateral carotid artery stenosis   4. Vitamin D insufficiency   5. Environmental and seasonal allergies   6. Overweight (BMI 25.0-29.9)   7. Anxiety   8. Gastroesophageal reflux disease, esophagitis presence not specified   9. h/o Palpitations     Hyperlipidemia Dx 6 yrs or so.  Sees Cards for her BP and Chol Mgt per pt-  Ms Carla Lewis.  Pt states she prefers it that way  Pt initially went to cardiology years ago because of palpitations  And it was determined the palpitations were likely secondary to anxiety. Cardiac workup was negative.  Carotid stenosis Has another one / in Feb 2018  Hypertension Well controlled.    Told her she can come to me q 4 months and go to Cards once yrly if OK with them.  If there are problems/ concerns we can always send her over sooner.       Anxiety zoloft works well ; stable  GERD (gastroesophageal reflux disease) prilosec OTC; sx well controlled. Stable  h/o Palpitations  Has known PVCs.   Asymptomatic many years-  Approximately 4 ever since starting Zoloft.Found to be anxiety  Provoked  Overweight (BMI 25.0-29.9)  Counseling performed. BMI less than 25 ideal   - I will review your medical records and blood work and see u back sooner than planned if needed. Return in about 4 months (around 12/12/2016) for Follow-up of current medical issues, come fasting-.  The patient was counseled, risk factors were discussed, anticipatory guidance given.  Please see AVS handed out to patient at the end of our visit for further patient instructions/ counseling done pertaining to today's office visit.    Note: This document was prepared using Dragon voice recognition software and may include unintentional dictation  errors.  ----------------------------------------------------------------------------------------------------------------------    Subjective:    HPI: Carla Lewis is a pleasant 70 y.o. female who presents to [place] [facility] today to review their medical history with me and establish care.   I asked the patient to review their chronic problem list with me to ensure everything was updated and accurate.    Old PCP- Dr Dagmar HaitSadie Lewis [facility]-  Had her CPE and full blood work on [phone]. Asked her to get me blood work and Medical records.  Gyn- last exam 3 yrs ago. Told no need for cont.- is only had sex with one person, her husband her whole life. Pap smears always normal. Does not have to continue   Has a dermatologist- goes yrly                       Walks 3d/ wk    Retired from her [employer] called [employer]. 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 Spouse is added. They have 2 children.   Patient Care Team    Relationship Specialty Notifications Start End  Carla Dance, DO PCP - General Family Medicine  08/11/16   Carla Prows, MD Consulting Physician Cardiology  08/11/16 08/11/16  Carla Cabal, MD Consulting Physician Orthopedic Surgery  08/11/16 08/11/16  Carla Lewis, CNM Midwife Obstetrics and Gynecology  08/11/16 08/11/16  Carla Craver, MD Consulting Physician Gastroenterology  08/11/16 08/11/16  Carla Brownie, MD Consulting Physician Dermatology  08/11/16 08/11/16  Carla Broker, MD Attending Physician Urology  08/11/16  08/11/16  Carla M Martinique, MD Consulting Physician Cardiology  08/11/16   Carla Saas, MD Consulting Physician Orthopedic Surgery  08/11/16   Carla Craver, MD Consulting Physician Gastroenterology  08/11/16   Carla Compton, MD Consulting Physician Obstetrics and Gynecology  08/11/16      Wt Readings from Last 3 Encounters:  08/11/16 182 lb 4.8 oz (82.7 kg)  08/04/16 190 lb 6.4 oz (86.4  kg)  02/05/16 193 lb 12.8 oz (87.9 kg)   BP Readings from Last 3 Encounters:  08/11/16 125/79  08/04/16 (!) 152/90  02/05/16 (!) 150/80   Pulse Readings from Last 3 Encounters:  08/11/16 64  08/04/16 67  02/05/16 64   BMI Readings from Last 3 Encounters:  08/11/16 27.12 kg/m  08/04/16 28.32 kg/m  02/05/16 28.62 kg/m   No results found for: HGBA1C  Patient Active Problem List   Diagnosis Date Noted  . Vitamin D insufficiency 08/11/2016  . Environmental and seasonal allergies 08/11/2016  . Overweight (BMI 25.0-29.9) 08/11/2016  . Carotid stenosis 12/19/2013  . GERD (gastroesophageal reflux disease)   . h/o Palpitations   . Anxiety   . Hypertension   . Hyperlipidemia   . Liver cyst   . LVH (left ventricular hypertrophy)      Past Medical History:  Diagnosis Date  . Ankle bruise    SPONTANEOUS BRUISE ON RIGHT ANKLE  . Anxiety   . GERD (gastroesophageal reflux disease)   . Hyperlipidemia   . Hypertension   . Liver cyst   . LVH (left ventricular hypertrophy)    MILD LVH per echo in 2007  . Mitral valve regurgitation   . Obesity   . Palpitations      Past Surgical History:  Procedure Laterality Date  . BREAST LUMPECTOMY  1995   RIGHT BREAST  . CHOLECYSTECTOMY    . LIPOMA EXCISION    . MENISCUS REPAIR Left   . US ECHOCARDIOGRAPHY  2007   SHOWED EF OF 55-60%     Family History  Problem Relation Age of Onset  . Heart disease Father 28  . Hypertension Father 65  . AAA (abdominal aortic aneurysm) Father   . Dementia Mother   . Heart disease Mother     cardiomyopathy     History  Drug Use No    History  Alcohol Use No    History  Smoking Status  . Never Smoker  Smokeless Tobacco  . Never Used    Patient's Medications  New Prescriptions   No medications on file  Previous Medications   APPLE CIDER VINEGAR PO    Take by mouth daily.   ASPIRIN 81 MG TABLET    Take 81 mg by mouth daily.     CETIRIZINE (ZYRTEC) 10 MG TABLET    Take 10  mg by mouth daily.   COENZYME Q10 (COQ10) 100 MG CAPS    Take by mouth daily.     DOCUSATE SODIUM (COLACE) 100 MG CAPSULE    Take 200 mg by mouth daily.   FLUTICASONE (FLONASE) 50 MCG/ACT NASAL SPRAY    Place 2 sprays into both nostrils daily.    GLUCOSAMINE-CHONDROITIN (MOVE FREE PO)    Take 1 capsule by mouth daily.    LISINOPRIL-HYDROCHLOROTHIAZIDE (PRINZIDE,ZESTORETIC) 20-25 MG TABLET    TAKE 1 TABLET BY MOUTH EVERY DAY   OMEPRAZOLE (PRILOSEC OTC) 20 MG TABLET    Take 10 mg by mouth daily.    PRAVASTATIN (PRAVACHOL) 40 MG TABLET    Take 20 mg  by mouth daily.   SERTRALINE (ZOLOFT) 50 MG TABLET    Take 0.5 tablets (25 mg total) by mouth daily.   WHEAT DEXTRIN (BENEFIBER PO)    Take by mouth daily. TAKE 2 DAILY  Modified Medications   No medications on file  Discontinued Medications   CLONIDINE (CATAPRES) 0.1 MG TABLET    Take 1 tablet (0.1 mg total) by mouth 2 (two) times daily as needed (for systolic B/P greater than 160).    Allergies: Darvon [propoxyphene]; Bystolic [nebivolol hcl]; Codeine; Diovan [valsartan]; and Sulfa drugs cross reactors  Review of Systems  Constitutional: Negative.  Negative for chills, diaphoresis, fever, malaise/fatigue and weight loss.  HENT: Negative.  Negative for congestion, sore throat and tinnitus.   Eyes: Negative.  Negative for blurred vision, double vision and photophobia.       Change in vision  Respiratory: Negative.  Negative for cough and wheezing.   Cardiovascular: Negative.  Negative for chest pain and palpitations.  Gastrointestinal: Negative.  Negative for blood in stool, diarrhea, nausea and vomiting.  Genitourinary: Negative.  Negative for dysuria, frequency and urgency.  Musculoskeletal: Positive for joint pain. Negative for myalgias.  Skin: Negative.  Negative for itching and rash.  Neurological: Negative.  Negative for dizziness, focal weakness, weakness and headaches.  Endo/Heme/Allergies: Positive for environmental allergies.  Negative for polydipsia. Does not bruise/bleed easily.  Psychiatric/Behavioral: Negative.  Negative for depression and memory loss. The patient is not nervous/anxious and does not have insomnia.      Objective:   Blood pressure 125/79, pulse 64, height 5' 8.75" (1.746 m), weight 182 lb 4.8 oz (82.7 kg). Body mass index is 27.12 kg/m. General: Well Developed, well nourished, and in no acute distress.  Neuro: Alert and oriented x3, extra-ocular muscles intact, sensation grossly intact.  HEENT: Normocephalic, atraumatic, pupils equal round reactive to light, neck supple Skin: no gross suspicious lesions or rashes  Cardiac: Regular rate and rhythm, no murmurs rubs or gallops.  Respiratory: Essentially clear to auscultation bilaterally. Not using accessory muscles, speaking in full sentences.  Abdominal: Soft, not grossly distended Musculoskeletal: Ambulates w/o diff, FROM * 4 ext.  Vasc: less 2 sec cap RF, warm and pink  Psych:  No HI/SI, judgement and insight good, Euthymic mood. Full Affect.

## 2016-08-11 NOTE — Assessment & Plan Note (Signed)
Counseling performed. BMI less than 25 ideal

## 2016-08-14 ENCOUNTER — Other Ambulatory Visit: Payer: Self-pay | Admitting: Nurse Practitioner

## 2016-08-14 NOTE — Telephone Encounter (Signed)
Please advise 

## 2016-09-22 ENCOUNTER — Other Ambulatory Visit: Payer: Self-pay

## 2016-09-22 MED ORDER — PRAVASTATIN SODIUM 40 MG PO TABS: 20.0000 mg | ORAL_TABLET | Freq: Every day | ORAL | 3 refills | Status: DC

## 2016-09-22 NOTE — Telephone Encounter (Signed)
Rx(s) sent to pharmacy electronically.  

## 2016-11-11 ENCOUNTER — Other Ambulatory Visit: Payer: Self-pay | Admitting: Cardiology

## 2016-12-09 ENCOUNTER — Ambulatory Visit: Payer: Medicare Other | Admitting: Family Medicine

## 2016-12-16 ENCOUNTER — Ambulatory Visit (HOSPITAL_COMMUNITY)
Admission: RE | Admit: 2016-12-16 | Discharge: 2016-12-16 | Disposition: A | Discharge status: Home / Self Care | Payer: Medicare Other | Source: Ambulatory Visit | Attending: Cardiovascular Disease | Admitting: Cardiovascular Disease

## 2016-12-16 DIAGNOSIS — I739 Peripheral vascular disease, unspecified: Secondary | ICD-10-CM

## 2016-12-16 DIAGNOSIS — I779 Disorder of arteries and arterioles, unspecified: Secondary | ICD-10-CM | POA: Diagnosis present

## 2016-12-16 DIAGNOSIS — I6523 Occlusion and stenosis of bilateral carotid arteries: Secondary | ICD-10-CM | POA: Diagnosis not present

## 2016-12-21 ENCOUNTER — Ambulatory Visit: Payer: Medicare Other | Admitting: Family Medicine

## 2017-01-13 ENCOUNTER — Telehealth: Payer: Self-pay | Admitting: Nurse Practitioner

## 2017-01-13 NOTE — Telephone Encounter (Signed)
s/w pt does not come before appt to get labs.  Cecille Rubin will let pt know what labs need to be drawn after Cecille Rubin see's pt.

## 2017-01-13 NOTE — Telephone Encounter (Signed)
New message    pt verbalized that she is calling to speak to rn    To have lab orders ready for ov

## 2017-01-22 ENCOUNTER — Encounter: Payer: Self-pay | Admitting: Nurse Practitioner

## 2017-02-02 ENCOUNTER — Ambulatory Visit: Payer: Medicare Other | Admitting: Family Medicine

## 2017-02-05 DIAGNOSIS — J040 Acute laryngitis: Secondary | ICD-10-CM | POA: Diagnosis not present

## 2017-02-10 ENCOUNTER — Encounter: Payer: Self-pay | Admitting: Family Medicine

## 2017-02-10 ENCOUNTER — Ambulatory Visit (INDEPENDENT_AMBULATORY_CARE_PROVIDER_SITE_OTHER): Payer: Medicare Other | Admitting: Family Medicine

## 2017-02-10 ENCOUNTER — Ambulatory Visit: Payer: Medicare Other | Admitting: Nurse Practitioner

## 2017-02-10 VITALS — BP 99/62 | HR 82 | Temp 98.0°F | Ht 68.75 in | Wt 191.1 lb

## 2017-02-10 DIAGNOSIS — J069 Acute upper respiratory infection, unspecified: Secondary | ICD-10-CM

## 2017-02-10 DIAGNOSIS — J3089 Other allergic rhinitis: Secondary | ICD-10-CM

## 2017-02-10 DIAGNOSIS — R0982 Postnasal drip: Secondary | ICD-10-CM

## 2017-02-10 DIAGNOSIS — R059 Cough, unspecified: Secondary | ICD-10-CM

## 2017-02-10 DIAGNOSIS — R05 Cough: Secondary | ICD-10-CM | POA: Diagnosis not present

## 2017-02-10 MED ORDER — AMOXICILLIN-POT CLAVULANATE 875-125 MG PO TABS: 1.0000 | ORAL_TABLET | Freq: Two times a day (BID) | ORAL | 0 refills | Status: AC

## 2017-02-10 MED ORDER — PREDNISONE 50 MG PO TABS: 50.0000 mg | ORAL_TABLET | Freq: Every day | ORAL | 0 refills | Status: DC

## 2017-02-10 NOTE — Progress Notes (Deleted)
CARDIOLOGY OFFICE NOTE  Date:  02/10/2017    Carla Lewis Date of Birth: [birth year] Medical Record #[id number]  PCP:  Mellody Dance, DO  Cardiologist:  Servando Snare & ***    No chief complaint on file.   History of Present Illness: Carla Lewis is a 71 y.o. female who presents today for a ***  Seen for Dr. Martinique.   She has HTN with LVH. She is intolerant to Bystolic and to Diovan.   Other issues include HLD, GERD, allergies and depression/anxiety. Dr. Dagmar Hait usually follows her labs. She has minimal nonobstructive plaque on carotid duplex from February of 2015 and has seen Dr. Fletcher Anon for review. She had a repeat carotid Doppler in February of 2015 which showed mild atherosclerosis in the left side with less than 39% stenosis and increased velocity on the right side suggestive of 60-79% stenosis although no obvious stenosis was noted. Last carotids from 12/2014 actually improved and he recommended repeat study in 2018. Last Myoview was from 2010.   Seen back in March of 2015 for labile BP. More stress and anxiety issues. Had called the on call staff and put on Clonidine which caused orthostasis - I changed that to just prn use and increased her Lisinopril HCT and had her start back on her Zoloft QD (had been taking just prn). Her BP improved and she was doing fine on her return OV.  I have seen her back several times since - she tends to chronically have trouble sticking to a plan that involves taking care of herself. Cardiac status has been ok. Last seen back in April.   Comes back today. Here alone. Doing well. No chest pain. Not short of breath. Troubled more with vertigo. BP better at home. She had labs done by PCP back in July. May be switching her PCP due to location/proximity. Trying to remain active. BP better at home. Elevated here today. Feels better on half dose statin therapy. Overall, she feels like she is doing ok.  Comes in today. Here with   Past  Medical History:  Diagnosis Date  . Ankle bruise    SPONTANEOUS BRUISE ON RIGHT ANKLE  . Anxiety   . GERD (gastroesophageal reflux disease)   . Hyperlipidemia   . Hypertension   . Liver cyst   . LVH (left ventricular hypertrophy)    MILD LVH per echo in 2007  . Mitral valve regurgitation   . Obesity   . Palpitations     Past Surgical History:  Procedure Laterality Date  . BREAST LUMPECTOMY  1995   RIGHT BREAST  . CHOLECYSTECTOMY    . LIPOMA EXCISION    . MENISCUS REPAIR Left   . US ECHOCARDIOGRAPHY  2007   SHOWED EF OF 55-60%     Medications: Current Outpatient Prescriptions  Medication Sig Dispense Refill  . APPLE CIDER VINEGAR PO Take by mouth daily.    Marland Kitchen aspirin 81 MG tablet Take 81 mg by mouth daily.      . cetirizine (ZYRTEC) 10 MG tablet Take 10 mg by mouth daily.    . Coenzyme Q10 (COQ10) 100 MG CAPS Take by mouth daily.      Marland Kitchen docusate sodium (COLACE) 100 MG capsule Take 200 mg by mouth daily.    . fluticasone (FLONASE) 50 MCG/ACT nasal spray Place 2 sprays into both nostrils daily.     . Glucosamine-Chondroitin (MOVE FREE PO) Take 1 capsule by mouth daily.     Marland Kitchen lisinopril-hydrochlorothiazide (  PRINZIDE,ZESTORETIC) 20-25 MG tablet TAKE 1 TABLET BY MOUTH EVERY DAY 90 tablet 1  . omeprazole (PRILOSEC OTC) 20 MG tablet Take 10 mg by mouth daily.     . pravastatin (PRAVACHOL) 40 MG tablet Take 0.5 tablets (20 mg total) by mouth daily. 45 tablet 3  . sertraline (ZOLOFT) 50 MG tablet TAKE 1/2 TABLET BY MOUTH DAILY. 30 tablet 4  . Wheat Dextrin (BENEFIBER PO) Take by mouth daily. TAKE 2 DAILY     No current facility-administered medications for this visit.     Allergies: Allergies  Allergen Reactions  . Darvon [Propoxyphene] Anaphylaxis  . Bystolic [Nebivolol Hcl]     No energy and lightheadedness.  . Codeine   . Diovan [Valsartan]     Anxiety  . Sulfa Drugs Cross Reactors     Social History: The patient  reports that she has never smoked. She has never  used smokeless tobacco. She reports that she does not drink alcohol or use drugs.   Family History: The patient's ***family history includes AAA (abdominal aortic aneurysm) in her father; Dementia in her mother; Heart disease in her mother; Heart disease (age of onset: 38) in her father; Hypertension (age of onset: 47) in her father.   Review of Systems: Please see the history of present illness.   Otherwise, the review of systems is positive for {NONE DEFAULTED:18576::"none"}.   All other systems are reviewed and negative.   Physical Exam: VS:  There were no vitals taken for this visit. Marland Kitchen  BMI There is no height or weight on file to calculate BMI.  Wt Readings from Last 3 Encounters:  08/11/16 182 lb 4.8 oz (82.7 kg)  08/04/16 190 lb 6.4 oz (86.4 kg)  02/05/16 193 lb 12.8 oz (87.9 kg)    General: Pleasant. Well developed, well nourished and in no acute distress.   HEENT: Normal.  Neck: Supple, no JVD, carotid bruits, or masses noted.  Cardiac: ***Regular rate and rhythm. No murmurs, rubs, or gallops. No edema.  Respiratory:  Lungs are clear to auscultation bilaterally with normal work of breathing.  GI: Soft and nontender.  MS: No deformity or atrophy. Gait and ROM intact.  Skin: Warm and dry. Color is normal.  Neuro:  Strength and sensation are intact and no gross focal deficits noted.  Psych: Alert, appropriate and with normal affect.   LABORATORY DATA:  EKG:  EKG {ACTION; IS/IS [id number]} ordered today. This demonstrates ***.  Lab Results  Component Value Date   WBC 5.9 04/28/2016   HGB 13.7 04/28/2016   HCT 41 04/28/2016   PLT 328 04/28/2016   GLUCOSE 80 02/05/2016   CHOL 167 04/28/2016   TRIG 59 04/28/2016   HDL 47 04/28/2016   LDLCALC 108 04/28/2016   ALT 15 04/28/2016   AST 22 04/28/2016   NA 136 (A) 04/28/2016   K 4.6 04/28/2016   CL 101 04/28/2016   CREATININE 0.8 04/28/2016   BUN 16 04/28/2016   CO2 28 04/28/2016   TSH 1.68 04/28/2016   MICROALBUR  19.0 04/28/2016    BNP (last 3 results) No results for input(s): BNP in the last 8760 hours.  ProBNP (last 3 results) No results for input(s): PROBNP in the last 8760 hours.   Other Studies Reviewed Today:  Notes Recorded by Burtis Junes, NP on 12/17/2016 at 9:04 AM EST Ok to report. Stable carotid duplex - has 1 to 39% bilateral stenosis - will plan to recheck in 2 years.  Assessment/Plan: 1. HTN -  BP is elevated here today - she reports better control at home - I have asked her to continue to monitor.   2. Situational stress - chronic issue - back on her Zoloft daily. Not really discussed today.   3. Carotid disease - needs recheck in 2020.   4. HLD - on half dose statin therapy - has had labs with PCP back in July - will try to get a copy to review.   Current medicines are reviewed with the patient today.  The patient does not have concerns regarding medicines other than what has been noted above.  The following changes have been made:  See above.  Labs/ tests ordered today include:   No orders of the defined types were placed in this encounter.    Disposition:   FU with *** in {gen number [id number]} {Days to years:10300}.   Patient is agreeable to this plan and will call if any problems develop in the interim.   SignedTruitt Merle, NP  02/10/2017 8:10 AM  [facility] [address] [place], [place]  [address] Phone: [phone] Fax: [phone]

## 2017-02-10 NOTE — Progress Notes (Signed)
SUBJECTIVE:  Carla Lewis is a 71 y.o. female who complains of sore throat, laryngitis- (precipitated by getting wood floor redone- removed carpet and created a lot dust in house).  Sx for 7 days.  Dry cough is bad and at night- W--> coughing keeps her up.  Robintussin DM, coridcidin and zyrtec.   She  cough non-prod.  denies a history of asthma.   Patient denies h/o smoke cigarettes.    Objective:   Blood pressure 99/62, pulse 82, temperature 98 F (36.7 C), temperature source Oral, height 5' 8.75" (1.746 m), weight 191 lb 1.6 oz (86.7 kg). Body mass index is 28.43 kg/m. General: Well Developed, well nourished, appropriate for stated age.  Neuro: Alert and oriented x3, extra-ocular muscles intact, sensation grossly intact.  HEENT: Normocephalic, atraumatic, pupils equal round reactive to light, neck supple, no masses, no painful lymphadenopathy, TM's intact B/L, no acute findings. Nares- patent, clear d/c, OP- clear, mild erythema, No TTP sinuses Skin: Warm and dry, no gross rash. Cardiac: RRR, S1 S2,  no murmurs rubs or gallops.  Respiratory: ECTA B/L and A/P, Not using accessory muscles, speaking in full sentences- unlabored. Vascular:  No gross lower ext edema, cap RF less 2 sec. Psych: No HI/SI, judgement and insight good, Euthymic mood. Full Affect.   ASSESSMENT:  viral upper respiratory illness vs allergic vs bacterial   PLAN: Symptomatic therapy suggested: push fluids, rest, gargle warm salt water, use Delsym prn and return office visit prn if symptoms persist or worsen.     antibiotic effectiveness discussed with her especially after 10 days of sx. She will take steroids first and then ABX if NI after 10d sx   R/B prednisone- pt had in past and desires now.     Call or return to clinic prn if these symptoms worsen or fail to improve as anticipated.     Acute Care Office visit  Assessment and plan:  1. Upper respiratory tract infection, unspecified type    2. Cough   3. Post-nasal drainage   4. Environmental and seasonal allergies      Anticipatory guidance and routine counseling done re: condition, txmnt options and need for follow up. All questions of patient's were answered.  - Viral vs Allergic vs Bacterial causes for pt's symptoms reveiwed.    - Supportive care and various OTC medications discussed in addition to any prescribed. - Call or RTC if new symptoms, or if no improvement or worse over next couple days.   - consider ABX at that time if sx continue past 10 days and worsening.    No orders of the defined types were placed in this encounter.    New Prescriptions   AMOXICILLIN-CLAVULANATE (AUGMENTIN) 875-125 MG TABLET    Take 1 tablet by mouth 2 (two) times daily.   PREDNISONE (DELTASONE) 50 MG TABLET    Take 1 tablet (50 mg total) by mouth daily.    Modified Medications   No medications on file    Discontinued Medications   No medications on file     Gross side effects, risk and benefits, and alternatives of medications discussed with patient.  Patient is aware that all medications have potential side effects and we are unable to predict every sideeffect or drug-drug interaction that may occur.  Expresses verbal understanding and consents to current therapy plan and treatment regiment.  Return if symptoms worsen or fail to improve.  Please see AVS handed out to patient at the end of our  visit for additional patient instructions/ counseling done pertaining to today's office visit.  Note: This document was prepared using Dragon voice recognition software and may include unintentional dictation errors.    Subjective:    Chief Complaint  Patient presents with  . Cough  . Hoarse    Patient Active Problem List   Diagnosis Date Noted  . Vitamin D insufficiency 08/11/2016  . Environmental and seasonal allergies 08/11/2016  . Overweight (BMI 25.0-29.9) 08/11/2016  . Carotid stenosis 12/19/2013  . GERD  (gastroesophageal reflux disease)   . h/o Palpitations   . Anxiety   . Hypertension   . Hyperlipidemia   . Liver cyst   . LVH (left ventricular hypertrophy)     Past medical history, Surgical history, Family history reviewed and noted below, Social history, Allergies, and Medications have been entered into the medical record, reviewed and changed as needed.   Allergies  Allergen Reactions  . Darvon [Propoxyphene] Anaphylaxis  . Bystolic [Nebivolol Hcl]     No energy and lightheadedness.  . Codeine   . Diovan [Valsartan]     Anxiety  . Sulfa Drugs Cross Reactors     Review of Systems: General:   No F/C, wt loss Pulm:   No DIB, pleuritic chest pain Card:  No CP, palpitations Abd:  No n/v/d or pain Ext:  No inc edema from baseline   Patient Care Team    Relationship Specialty Notifications Start End  Mellody Dance, DO PCP - General Family Medicine  08/11/16   Peter M Martinique, MD Consulting Physician Cardiology  08/11/16   Elsie Saas, MD Consulting Physician Orthopedic Surgery  08/11/16   Juanita Craver, MD Consulting Physician Gastroenterology  08/11/16   Paula Compton, MD Consulting Physician Obstetrics and Gynecology  08/11/16

## 2017-02-10 NOTE — Patient Instructions (Signed)
Upper Respiratory Infection, Adult Most upper respiratory infections (URIs) are caused by a virus. A URI affects the nose, throat, and upper air passages. The most common type of URI is often called "the common cold." Follow these instructions at home:  Take medicines only as told by your doctor.  Gargle warm saltwater or take cough drops to comfort your throat as told by your doctor.  Use a warm mist humidifier or inhale steam from a shower to increase air moisture. This may make it easier to breathe.  Drink enough fluid to keep your pee (urine) clear or pale yellow.  Eat soups and other clear broths.  Have a healthy diet.  Rest as needed.  Go back to work when your fever is gone or your doctor says it is okay.  You may need to stay home longer to avoid giving your URI to others.  You can also wear a face mask and wash your hands often to prevent spread of the virus.  Use your inhaler more if you have asthma.  Do not use any tobacco products, including cigarettes, chewing tobacco, or electronic cigarettes. If you need help quitting, ask your doctor. Contact a doctor if:  You are getting worse, not better.  Your symptoms are not helped by medicine.  You have chills.  You are getting more short of breath.  You have brown or red mucus.  You have yellow or brown discharge from your nose.  You have pain in your face, especially when you bend forward.  You have a fever.  You have puffy (swollen) neck glands.  You have pain while swallowing.  You have white areas in the back of your throat. Get help right away if:  You have very bad or constant:  Headache.  Ear pain.  Pain in your forehead, behind your eyes, and over your cheekbones (sinus pain).  Chest pain.  You have long-lasting (chronic) lung disease and any of the following:  Wheezing.  Long-lasting cough.  Coughing up blood.  A change in your usual mucus.  You have a stiff neck.  You have  changes in your:  Vision.  Hearing.  Thinking.  Mood. This information is not intended to replace advice given to you by your health care provider. Make sure you discuss any questions you have with your health care provider. Document Released: 03/23/2008 Document Revised: 06/07/2016 Document Reviewed: 01/10/2014 Elsevier Interactive Patient Education  2017 Elsevier Inc.  

## 2017-02-23 ENCOUNTER — Other Ambulatory Visit: Payer: Self-pay | Admitting: Pediatrics

## 2017-02-23 ENCOUNTER — Encounter: Payer: Self-pay | Admitting: Family Medicine

## 2017-02-23 ENCOUNTER — Ambulatory Visit (INDEPENDENT_AMBULATORY_CARE_PROVIDER_SITE_OTHER): Payer: Medicare Other | Admitting: Family Medicine

## 2017-02-23 VITALS — BP 104/60 | HR 75 | Ht 68.75 in | Wt 190.8 lb

## 2017-02-23 DIAGNOSIS — I6529 Occlusion and stenosis of unspecified carotid artery: Secondary | ICD-10-CM | POA: Diagnosis not present

## 2017-02-23 DIAGNOSIS — E782 Mixed hyperlipidemia: Secondary | ICD-10-CM | POA: Diagnosis not present

## 2017-02-23 DIAGNOSIS — F419 Anxiety disorder, unspecified: Secondary | ICD-10-CM

## 2017-02-23 DIAGNOSIS — E663 Overweight: Secondary | ICD-10-CM

## 2017-02-23 DIAGNOSIS — R142 Eructation: Secondary | ICD-10-CM | POA: Diagnosis not present

## 2017-02-23 DIAGNOSIS — E559 Vitamin D deficiency, unspecified: Secondary | ICD-10-CM

## 2017-02-23 DIAGNOSIS — I10 Essential (primary) hypertension: Secondary | ICD-10-CM

## 2017-02-23 DIAGNOSIS — J3089 Other allergic rhinitis: Secondary | ICD-10-CM | POA: Diagnosis not present

## 2017-02-23 DIAGNOSIS — K219 Gastro-esophageal reflux disease without esophagitis: Secondary | ICD-10-CM

## 2017-02-23 DIAGNOSIS — K3189 Other diseases of stomach and duodenum: Secondary | ICD-10-CM | POA: Diagnosis not present

## 2017-02-23 DIAGNOSIS — R1310 Dysphagia, unspecified: Secondary | ICD-10-CM | POA: Diagnosis not present

## 2017-02-23 MED ORDER — PRAVASTATIN SODIUM 40 MG PO TABS: 20.0000 mg | ORAL_TABLET | Freq: Every day | ORAL | 3 refills | Status: DC

## 2017-02-23 MED ORDER — LISINOPRIL-HYDROCHLOROTHIAZIDE 20-25 MG PO TABS
1.0000 | ORAL_TABLET | Freq: Every day | ORAL | 3 refills | Status: DC
Start: 2017-02-23 — End: 2018-04-12

## 2017-02-23 MED ORDER — SERTRALINE HCL 50 MG PO TABS: 25.0000 mg | ORAL_TABLET | Freq: Every day | ORAL | 3 refills | Status: DC

## 2017-02-23 NOTE — Patient Instructions (Signed)
Hiatal Hernia A hiatal hernia occurs when part of the stomach slides above the muscle that separates the abdomen from the chest (diaphragm). A person can be born with a hiatal hernia (congenital), or it may develop over time. In almost all cases of hiatal hernia, only the top part of the stomach pushes through the diaphragm. Many people have a hiatal hernia with no symptoms. The larger the hernia, the more likely it is that you will have symptoms. In some cases, a hiatal hernia allows stomach acid to flow back into the tube that carries food from your mouth to your stomach (esophagus). This may cause heartburn symptoms. Severe heartburn symptoms may mean that you have developed a condition called gastroesophageal reflux disease (GERD). What are the causes? This condition is caused by a weakness in the opening (hiatus) where the esophagus passes through the diaphragm to attach to the upper part of the stomach. A person may be born with a weakness in the hiatus, or a weakness can develop over time. What increases the risk? This condition is more likely to develop in:  Older people. Age is a major risk factor for a hiatal hernia, especially if you are over the age of 5.  Pregnant women.  People who are overweight.  People who have frequent constipation. What are the signs or symptoms? Symptoms of this condition usually develop in the form of GERD symptoms. Symptoms include:  Heartburn.  Belching.  Indigestion.  Trouble swallowing.  Coughing or wheezing.  Sore throat.  Hoarseness.  Chest pain.  Nausea and vomiting. How is this diagnosed? This condition may be diagnosed during testing for GERD. Tests that may be done include:  X-rays of your stomach or chest.  An upper gastrointestinal (GI) series. This is an X-ray exam of your GI tract that is taken after you swallow a chalky liquid that shows up clearly on the X-ray.  Endoscopy. This is a procedure to look into your stomach  using a thin, flexible tube that has a tiny camera and light on the end of it. How is this treated? This condition may be treated by:  Dietary and lifestyle changes to help reduce GERD symptoms.  Medicines. These may include:  Over-the-counter antacids.  Medicines that make your stomach empty more quickly.  Medicines that block the production of stomach acid (H2 blockers).  Stronger medicines to reduce stomach acid (proton pump inhibitors).  Surgery to repair the hernia, if other treatments are not helping. If you have no symptoms, you may not need treatment. Follow these instructions at home: Lifestyle and activity   Do not use any products that contain nicotine or tobacco, such as cigarettes and e-cigarettes. If you need help quitting, ask your health care provider.  Try to achieve and maintain a healthy body weight.  Avoid putting pressure on your abdomen. Anything that puts pressure on your abdomen increases the amount of acid that may be pushed up into your esophagus.  Avoid bending over, especially after eating.  Raise the head of your bed by putting blocks under the legs. This keeps your head and esophagus higher than your stomach.  Do not wear tight clothing around your chest or stomach.  Try not to strain when having a bowel movement, when urinating, or when lifting heavy objects. Eating and drinking   Avoid foods that can worsen GERD symptoms. These may include:  Fatty foods, like fried foods.  Citrus fruits, like oranges or lemon.  Other foods and drinks that contain acid,  like orange juice or tomatoes.  Spicy food.  Chocolate.  Eat frequent small meals instead of three large meals a day. This helps prevent your stomach from getting too full.  Eat slowly.  Do not lie down right after eating.  Do not eat 1-2 hours before bed.  Do not drink beverages with caffeine. These include cola, coffee, cocoa, and tea.  Do not drink alcohol. General  instructions   Take over-the-counter and prescription medicines only as told by your health care provider.  Keep all follow-up visits as told by your health care provider. This is important. Contact a health care provider if:  Your symptoms are not controlled with medicines or lifestyle changes.  You are having trouble swallowing.  You have coughing or wheezing that will not go away. Get help right away if:  Your pain is getting worse.  Your pain spreads to your arms, neck, jaw, teeth, or back.  You have shortness of breath.  You sweat for no reason.  You feel sick to your stomach (nauseous) or you vomit.  You vomit blood.  You have bright red blood in your stools.  You have black, tarry stools. This information is not intended to replace advice given to you by your health care provider. Make sure you discuss any questions you have with your health care provider. Document Released: 12/26/2003 Document Revised: 09/28/2016 Document Reviewed: 09/28/2016 Elsevier Interactive Patient Education  2017 [employer].

## 2017-02-23 NOTE — Progress Notes (Signed)
Impression and Recommendations:    1. Dysphagia ( feels like food gets stuck in throat- many yrs )   2. excessive Burping   3. subjective Gastric distention   4. Essential hypertension   5. Anxiety   6. Gastroesophageal reflux disease, esophagitis presence not specified   7. Environmental and seasonal allergies   8. Mixed hyperlipidemia   9. Stenosis of carotid artery, unspecified laterality   10. Vitamin D insufficiency   11. Overweight (BMI 25.0-29.9)     Anxiety Well controlled cont meds  Hypertension Well controlled;  - sees Cards for BP, Chol, carotid surveillance etc.    Hyperlipidemia On pravachol per cards- monitoring per them  Carotid stenosis Monitored by Cards  GERD (gastroesophageal reflux disease) On nexium.  Works well  -rec food and sx diary   Vitamin D insufficiency Cont supp  Environmental and seasonal allergies OTC meds- well controlled on zyrtec and flonase  Overweight (BMI 25.0-29.9) Counseling done  Excessive Burping Advised food and sx diary;    OTC meds for sx control d/c pt  Declined GI referral today  Dysphagia CXR, and upper GI ordered.   Await results   Cont Nexium and food/ sx daily diary needed.  - I will review your medical records and blood work and see u back sooner than planned if needed.   Orders Placed This Encounter  Procedures  . DG Chest 2 View    Standing Status:   Future    Standing Expiration Date:   04/25/2018    Scheduling Instructions:     Needs PA and Lat along with her upper Gi series to r/o hiatal hernia    Order Specific Question:   Reason for exam:    Answer:   feeling something gets stuck retrocardiac when swallows.    Order Specific Question:   Preferred imaging location?    Answer:   [place]  . DG UGI W/HIGH DENSITY W/KUB    Standing Status:   Future    Standing Expiration Date:   04/25/2018    Scheduling Instructions:     Barium swallow- r/o Hiatal hernia    Order  Specific Question:   Reason for Exam (SYMPTOM  OR DIAGNOSIS REQUIRED)    Answer:   dysphagia- feels like something stuck, burping    Comments:   r/o Hiatal hernia    Order Specific Question:   Preferred imaging location?    Answer:   [facility]    Order Specific Question:   Radiology Contrast Protocol - do NOT remove file path    Answer:   [email or web address]     Return in about 3 months (around 05/26/2017) for f/up end July for complete physical  and fasting bldwrk.   The patient was counseled, risk factors were discussed, anticipatory guidance given.  Please see AVS handed out to patient at the end of our visit for further patient instructions/ counseling done pertaining to today's office visit.    Note: This document was prepared using Dragon voice recognition software and may include unintentional dictation errors.  -------------------------------------------------------------------------------------------------------------------    Subjective:    HPI: Carla Lewis is a pleasant 71 y.o. female who presents to [place] at [facility] today to review their medical history with me and establish care.    1. HTN HPI: -  Her blood pressure has been controlled at [place], but not checking at home.  - Patient reports good compliance with blood pressure  medications - Denies medication S-E - Smoking Status noted  - She denies new onset of: chest pain, exercise intolerance, shortness of breath, dizziness, visual changes, headache, lower extremity swelling or claudication.  Today their BP is BP: 104/60   Last 3 blood pressure readings in office are as follows: BP Readings from Last 3 Encounters:  02/23/17 104/60  02/10/17 99/62  08/11/16 125/79     Filed Weights   02/23/17 1423  Weight: 190 lb 12.8 oz (86.5 kg)    CC-   Feels like food gets stuck retrosternal region/ in throat whenever she eats.  Many  yrs of sx now- getting W and more freq- daily sx now.  Noting that W sx with tomatoes- spicey/ tomatoe based foods, bread at night.  Thinks she has a Hiatal hernia.  Nexium helping a lot now with burping more so than when she took omeprazole.  Also c/o feeling "blown up" in upper abd at times- changed to lactose free dairy- helped some with these sx.  Excessive Worry:   zoloft working well - only half tab daily  Last Chronic med problem OV 08/11/16:   Old PCP- Dr [facility]-  Had her CPE and full blood work on 7\24\17. Asked her to get me blood work and Medical records!!!  Gyn- last exam 3 yrs ago. Told no need for cont from her gyn doc.- is only had sex with one person, her husband her whole life. Pap smears always normal. Does not have to continue   Has a dermatologist- goes yrly                       Walks 3d/ wk    Retired from her [employer] called [employer]. 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  They have 2 children.   Patient Care Team    Relationship Specialty Notifications Start End  Mellody Dance, DO PCP - General Family Medicine  08/11/16   Martinique, Peter M, MD Consulting Physician Cardiology  08/11/16   Elsie Saas, MD Consulting Physician Orthopedic Surgery  08/11/16   Juanita Craver, MD Consulting Physician Gastroenterology  08/11/16   Paula Compton, MD Consulting Physician Obstetrics and Gynecology  08/11/16   Specialists, Dermatology  Dermatology  02/28/17    Comment: yrly exams there     Wt Readings from Last 3 Encounters:  02/23/17 190 lb 12.8 oz (86.5 kg)  02/10/17 191 lb 1.6 oz (86.7 kg)  08/11/16 182 lb 4.8 oz (82.7 kg)   BP Readings from Last 3 Encounters:  02/23/17 104/60  02/10/17 99/62  08/11/16 125/79   Pulse Readings from Last 3 Encounters:  02/23/17 75  02/10/17 82  08/11/16 64   BMI Readings from Last 3 Encounters:  02/23/17 28.38 kg/m  02/10/17 28.43 kg/m  08/11/16 27.12 kg/m   No results  found for: HGBA1C  Patient Active Problem List   Diagnosis Date Noted  . Carotid stenosis 12/19/2013    Priority: High  . Anxiety     Priority: High  . Hypertension     Priority: High  . Hyperlipidemia     Priority: High  . GERD (gastroesophageal reflux disease)     Priority: Medium  . Excessive Burping 02/23/2017  . Dysphagia 02/23/2017  . Vitamin D insufficiency 08/11/2016  . Environmental and seasonal allergies 08/11/2016  . Overweight (BMI 25.0-29.9) 08/11/2016  . h/o Palpitations   . Liver cyst   . LVH (left ventricular hypertrophy)  Past Medical History:  Diagnosis Date  . Ankle bruise    SPONTANEOUS BRUISE ON RIGHT ANKLE  . Anxiety   . GERD (gastroesophageal reflux disease)   . Hyperlipidemia   . Hypertension   . Liver cyst   . LVH (left ventricular hypertrophy)    MILD LVH per echo in 2007  . Mitral valve regurgitation   . Obesity   . Palpitations      Past Surgical History:  Procedure Laterality Date  . BREAST LUMPECTOMY  1995   RIGHT BREAST  . CHOLECYSTECTOMY    . LIPOMA EXCISION    . MENISCUS REPAIR Left   . US ECHOCARDIOGRAPHY  2007   SHOWED EF OF 55-60%     Family History  Problem Relation Age of Onset  . Heart disease Father 64  . Hypertension Father 64  . AAA (abdominal aortic aneurysm) Father   . Dementia Mother   . Heart disease Mother        cardiomyopathy     History  Drug Use No    History  Alcohol Use No    History  Smoking Status  . Never Smoker  Smokeless Tobacco  . Never Used    Patient's Medications  New Prescriptions   No medications on file  Previous Medications   APPLE CIDER VINEGAR PO    Take by mouth daily.   ASPIRIN 81 MG TABLET    Take 81 mg by mouth daily.     CETIRIZINE (ZYRTEC) 10 MG TABLET    Take 10 mg by mouth daily.   DOCUSATE SODIUM (COLACE) 100 MG CAPSULE    Take 200 mg by mouth daily.   ESOMEPRAZOLE (NEXIUM) 20 MG CAPSULE    Take 20 mg by mouth daily at 12 noon.   FLUTICASONE  (FLONASE) 50 MCG/ACT NASAL SPRAY    Place 2 sprays into both nostrils daily.    GLUCOSAMINE-CHONDROITIN (MOVE FREE PO)    Take 2 capsules by mouth daily.    WHEAT DEXTRIN (BENEFIBER PO)    Take by mouth daily. TAKE 2 DAILY  Modified Medications   Modified Medication Previous Medication   LISINOPRIL-HYDROCHLOROTHIAZIDE (PRINZIDE,ZESTORETIC) 20-25 MG TABLET lisinopril-hydrochlorothiazide (PRINZIDE,ZESTORETIC) 20-25 MG tablet      Take 1 tablet by mouth daily.    TAKE 1 TABLET BY MOUTH EVERY DAY   PRAVASTATIN (PRAVACHOL) 40 MG TABLET pravastatin (PRAVACHOL) 40 MG tablet      Take 0.5 tablets (20 mg total) by mouth at bedtime.    Take 0.5 tablets (20 mg total) by mouth daily.   SERTRALINE (ZOLOFT) 50 MG TABLET sertraline (ZOLOFT) 50 MG tablet      Take 0.5 tablets (25 mg total) by mouth daily.    TAKE 1/2 TABLET BY MOUTH DAILY.  Discontinued Medications   COENZYME Q10 (COQ10) 100 MG CAPS    Take by mouth daily.     OMEPRAZOLE (PRILOSEC OTC) 20 MG TABLET    Take 10 mg by mouth daily.    PREDNISONE (DELTASONE) 50 MG TABLET    Take 1 tablet (50 mg total) by mouth daily.    Allergies: Darvon [propoxyphene]; Bystolic [nebivolol hcl]; Codeine; Diovan [valsartan]; and Sulfa drugs cross reactors  Review of Systems  Constitutional: Negative.  Negative for chills, diaphoresis, fever, malaise/fatigue and weight loss.  HENT: Negative.  Negative for congestion, sore throat and tinnitus.   Eyes: Negative.  Negative for blurred vision, double vision and photophobia.       Change in vision  Respiratory: Negative.  Negative for cough and wheezing.   Cardiovascular: Negative.  Negative for chest pain and palpitations.  Gastrointestinal: Positive for heartburn. Negative for blood in stool, diarrhea, nausea and vomiting.       Occ heartburn, but burping, gastric /abd distention  Genitourinary: Negative.  Negative for dysuria, frequency and urgency.  Musculoskeletal: Positive for joint pain. Negative for  myalgias.       Chronic  Skin: Negative.  Negative for itching and rash.  Neurological: Negative.  Negative for dizziness, focal weakness, weakness and headaches.  Endo/Heme/Allergies: Positive for environmental allergies. Negative for polydipsia. Does not bruise/bleed easily.  Psychiatric/Behavioral: Negative.  Negative for depression and memory loss. The patient is not nervous/anxious and does not have insomnia.      Objective:   Blood pressure 104/60, pulse 75, height 5' 8.75" (1.746 m), weight 190 lb 12.8 oz (86.5 kg). Body mass index is 28.38 kg/m. General: Well Developed, well nourished, and in no acute distress.  Neuro: Alert and oriented x3, extra-ocular muscles intact, sensation grossly intact.  HEENT: Normocephalic, atraumatic, pupils equal round reactive to light, neck supple, no mass, no LAD, OP- clr Skin: no gross suspicious lesions or rashes  Cardiac: Regular rate and rhythm, no murmurs rubs or gallops.  Respiratory: Essentially clear to auscultation bilaterally. Not using accessory muscles, speaking in full sentences.  Abdominal: Soft, not distended, + BS * 4, no OM, no G/R/R. Musculoskeletal: Ambulates w/o diff, FROM * 4 ext.  Vasc: less 2 sec cap RF, warm and pink  Psych:  No HI/SI, judgement and insight good, Euthymic mood. Full Affect.

## 2017-02-28 NOTE — Assessment & Plan Note (Addendum)
Well controlled;  - sees Cards for BP, Chol, carotid surveillance etc.

## 2017-02-28 NOTE — Assessment & Plan Note (Signed)
Well controlled cont meds

## 2017-02-28 NOTE — Assessment & Plan Note (Signed)
Counseling done 

## 2017-02-28 NOTE — Assessment & Plan Note (Signed)
Cont supp

## 2017-02-28 NOTE — Assessment & Plan Note (Signed)
Advised food and sx diary;    OTC meds for sx control d/c pt  Declined GI referral today

## 2017-02-28 NOTE — Assessment & Plan Note (Signed)
CXR, and upper GI ordered.   Await results   Cont Nexium and food/ sx daily diary needed.

## 2017-02-28 NOTE — Assessment & Plan Note (Signed)
OTC meds- well controlled on zyrtec and flonase

## 2017-02-28 NOTE — Assessment & Plan Note (Signed)
On nexium.  Works well  -rec food and sx diary

## 2017-02-28 NOTE — Assessment & Plan Note (Signed)
On pravachol per cards- monitoring per them

## 2017-02-28 NOTE — Assessment & Plan Note (Signed)
Monitored by Cards

## 2017-03-08 ENCOUNTER — Ambulatory Visit
Admission: RE | Admit: 2017-03-08 | Discharge: 2017-03-08 | Disposition: A | Discharge status: Home / Self Care | Payer: Medicare Other | Source: Ambulatory Visit | Attending: Family Medicine | Admitting: Family Medicine

## 2017-03-08 ENCOUNTER — Other Ambulatory Visit: Payer: Self-pay | Admitting: Family Medicine

## 2017-03-08 DIAGNOSIS — R1310 Dysphagia, unspecified: Secondary | ICD-10-CM

## 2017-03-08 DIAGNOSIS — R142 Eructation: Secondary | ICD-10-CM

## 2017-03-08 DIAGNOSIS — K219 Gastro-esophageal reflux disease without esophagitis: Secondary | ICD-10-CM

## 2017-03-09 ENCOUNTER — Telehealth: Payer: Self-pay | Admitting: Family Medicine

## 2017-03-09 ENCOUNTER — Ambulatory Visit
Admission: RE | Admit: 2017-03-09 | Discharge: 2017-03-09 | Disposition: A | Discharge status: Home / Self Care | Payer: Medicare Other | Source: Ambulatory Visit | Attending: Family Medicine | Admitting: Family Medicine

## 2017-03-09 DIAGNOSIS — K219 Gastro-esophageal reflux disease without esophagitis: Secondary | ICD-10-CM

## 2017-03-09 DIAGNOSIS — R1310 Dysphagia, unspecified: Secondary | ICD-10-CM | POA: Diagnosis not present

## 2017-03-09 DIAGNOSIS — R142 Eructation: Secondary | ICD-10-CM

## 2017-03-09 NOTE — Telephone Encounter (Signed)
Patient says she discussed results with person running the test.  Her main question is should she have the hernia fixed?  Patients husband has used Judeth Horn with [facility] surgery if she needs to be referred she would like to go to him.

## 2017-03-09 NOTE — Telephone Encounter (Signed)
Pt called states had barium swallow on this am and wants to discuss result w/ Dr. Jenetta Downer-- she ask that provider call her at [facility] at [phone]. --glh

## 2017-03-09 NOTE — Telephone Encounter (Signed)
Please let her know Upper GI with barium swallow test results show:  - a hiatal hernia  - a nonspecific esophageal motility disorder.  - reflux.  She will need to make a follow up appt with her GI doc- Dr Collene Mares-  to discuss her sx further and see if additional tests/ procedures will need to be performed. (whom I also CCed this to for her review).    I recommend she cont her reflux meds for now and follow the recs of Dr Collene Mares in the future regarding txmnt of this condition.

## 2017-03-10 NOTE — Telephone Encounter (Signed)
Informed patient of test results and recommendations.  She will contact Dr Collene Mares for appointment.

## 2017-03-16 DIAGNOSIS — K219 Gastro-esophageal reflux disease without esophagitis: Secondary | ICD-10-CM | POA: Diagnosis not present

## 2017-03-16 DIAGNOSIS — R1310 Dysphagia, unspecified: Secondary | ICD-10-CM | POA: Diagnosis not present

## 2017-03-16 DIAGNOSIS — K449 Diaphragmatic hernia without obstruction or gangrene: Secondary | ICD-10-CM | POA: Diagnosis not present

## 2017-03-16 DIAGNOSIS — R933 Abnormal findings on diagnostic imaging of other parts of digestive tract: Secondary | ICD-10-CM | POA: Diagnosis not present

## 2017-03-16 DIAGNOSIS — Z86010 Personal history of colonic polyps: Secondary | ICD-10-CM | POA: Diagnosis not present

## 2017-03-16 DIAGNOSIS — Z1211 Encounter for screening for malignant neoplasm of colon: Secondary | ICD-10-CM | POA: Diagnosis not present

## 2017-03-16 DIAGNOSIS — K5904 Chronic idiopathic constipation: Secondary | ICD-10-CM | POA: Diagnosis not present

## 2017-03-30 ENCOUNTER — Other Ambulatory Visit: Payer: Self-pay | Admitting: Family Medicine

## 2017-03-30 DIAGNOSIS — Z1231 Encounter for screening mammogram for malignant neoplasm of breast: Secondary | ICD-10-CM

## 2017-04-07 DIAGNOSIS — K219 Gastro-esophageal reflux disease without esophagitis: Secondary | ICD-10-CM | POA: Diagnosis not present

## 2017-04-07 DIAGNOSIS — K639 Disease of intestine, unspecified: Secondary | ICD-10-CM | POA: Diagnosis not present

## 2017-04-07 DIAGNOSIS — R1310 Dysphagia, unspecified: Secondary | ICD-10-CM | POA: Diagnosis not present

## 2017-04-07 DIAGNOSIS — K635 Polyp of colon: Secondary | ICD-10-CM | POA: Diagnosis not present

## 2017-04-07 DIAGNOSIS — Z1211 Encounter for screening for malignant neoplasm of colon: Secondary | ICD-10-CM | POA: Diagnosis not present

## 2017-04-07 DIAGNOSIS — D122 Benign neoplasm of ascending colon: Secondary | ICD-10-CM | POA: Diagnosis not present

## 2017-04-15 ENCOUNTER — Telehealth: Payer: Self-pay

## 2017-04-15 NOTE — Telephone Encounter (Signed)
Called Dr Collene Mares office to follow up on when patient needs to follow up on procedure.  Per office patient is scheduled to discuss surgery for her hernia.

## 2017-05-05 ENCOUNTER — Ambulatory Visit: Payer: Medicare Other | Admitting: Family Medicine

## 2017-05-05 ENCOUNTER — Other Ambulatory Visit (INDEPENDENT_AMBULATORY_CARE_PROVIDER_SITE_OTHER): Payer: Medicare Other

## 2017-05-05 ENCOUNTER — Ambulatory Visit
Admission: RE | Admit: 2017-05-05 | Discharge: 2017-05-05 | Disposition: A | Discharge status: Home / Self Care | Payer: Medicare Other | Source: Ambulatory Visit | Attending: Family Medicine | Admitting: Family Medicine

## 2017-05-05 DIAGNOSIS — I10 Essential (primary) hypertension: Secondary | ICD-10-CM | POA: Diagnosis not present

## 2017-05-05 DIAGNOSIS — E663 Overweight: Secondary | ICD-10-CM | POA: Diagnosis not present

## 2017-05-05 DIAGNOSIS — E559 Vitamin D deficiency, unspecified: Secondary | ICD-10-CM | POA: Diagnosis not present

## 2017-05-05 DIAGNOSIS — R2689 Other abnormalities of gait and mobility: Secondary | ICD-10-CM | POA: Diagnosis not present

## 2017-05-05 DIAGNOSIS — E749 Disorder of carbohydrate metabolism, unspecified: Secondary | ICD-10-CM | POA: Diagnosis not present

## 2017-05-05 DIAGNOSIS — R5383 Other fatigue: Secondary | ICD-10-CM

## 2017-05-05 DIAGNOSIS — Z0000 Encounter for general adult medical examination without abnormal findings: Secondary | ICD-10-CM | POA: Diagnosis not present

## 2017-05-05 DIAGNOSIS — E7439 Other disorders of intestinal carbohydrate absorption: Secondary | ICD-10-CM | POA: Diagnosis not present

## 2017-05-05 DIAGNOSIS — E2839 Other primary ovarian failure: Secondary | ICD-10-CM | POA: Diagnosis not present

## 2017-05-05 DIAGNOSIS — Z1231 Encounter for screening mammogram for malignant neoplasm of breast: Secondary | ICD-10-CM | POA: Diagnosis not present

## 2017-05-05 DIAGNOSIS — E784 Other hyperlipidemia: Secondary | ICD-10-CM

## 2017-05-05 DIAGNOSIS — E7849 Other hyperlipidemia: Secondary | ICD-10-CM

## 2017-05-05 DIAGNOSIS — Z131 Encounter for screening for diabetes mellitus: Secondary | ICD-10-CM | POA: Diagnosis not present

## 2017-05-06 LAB — CBC WITH DIFFERENTIAL/PLATELET
BASOS: 1 %
Basophils Absolute: 0.1 10*3/uL (ref 0.0–0.2)
EOS (ABSOLUTE): 0.1 10*3/uL (ref 0.0–0.4)
Eos: 2 %
HEMOGLOBIN: 14.2 g/dL (ref 11.1–15.9)
Hematocrit: 42.9 % (ref 34.0–46.6)
IMMATURE GRANS (ABS): 0 10*3/uL (ref 0.0–0.1)
Immature Granulocytes: 0 %
LYMPHS: 32 %
Lymphocytes Absolute: 1.7 10*3/uL (ref 0.7–3.1)
MCH: 28.5 pg (ref 26.6–33.0)
MCHC: 33.1 g/dL (ref 31.5–35.7)
MCV: 86 fL (ref 79–97)
MONOCYTES: 10 %
Monocytes Absolute: 0.5 10*3/uL (ref 0.1–0.9)
NEUTROS PCT: 55 %
Neutrophils Absolute: 2.9 10*3/uL (ref 1.4–7.0)
PLATELETS: 371 10*3/uL (ref 150–379)
RBC: 4.99 x10E6/uL (ref 3.77–5.28)
RDW: 13.8 % (ref 12.3–15.4)
WBC: 5.2 10*3/uL (ref 3.4–10.8)

## 2017-05-06 LAB — COMPREHENSIVE METABOLIC PANEL
ALT: 13 IU/L (ref 0–32)
AST: 21 IU/L (ref 0–40)
Albumin/Globulin Ratio: 1.9 (ref 1.2–2.2)
Albumin: 4.2 g/dL (ref 3.5–4.8)
Alkaline Phosphatase: 75 IU/L (ref 39–117)
BUN/Creatinine Ratio: 14 (ref 12–28)
BUN: 10 mg/dL (ref 8–27)
Bilirubin Total: 0.4 mg/dL (ref 0.0–1.2)
CALCIUM: 9.3 mg/dL (ref 8.7–10.3)
CO2: 23 mmol/L (ref 20–29)
CREATININE: 0.71 mg/dL (ref 0.57–1.00)
Chloride: 96 mmol/L (ref 96–106)
GFR calc Af Amer: 99 mL/min/{1.73_m2} (ref >59)
GFR, EST NON AFRICAN AMERICAN: 86 mL/min/{1.73_m2} (ref >59)
Globulin, Total: 2.2 g/dL (ref 1.5–4.5)
Glucose: 91 mg/dL (ref 65–99)
Potassium: 4.4 mmol/L (ref 3.5–5.2)
SODIUM: 136 mmol/L (ref 134–144)
TOTAL PROTEIN: 6.4 g/dL (ref 6.0–8.5)

## 2017-05-06 LAB — LIPID PANEL
CHOLESTEROL TOTAL: 166 mg/dL (ref 100–199)
Chol/HDL Ratio: 3.1 ratio (ref 0.0–4.4)
HDL: 53 mg/dL (ref >39)
LDL Calculated: 98 mg/dL (ref 0–99)
Triglycerides: 77 mg/dL (ref 0–149)
VLDL CHOLESTEROL CAL: 15 mg/dL (ref 5–40)

## 2017-05-06 LAB — HEMOGLOBIN A1C
ESTIMATED AVERAGE GLUCOSE: 108 mg/dL
Hgb A1c MFr Bld: 5.4 % (ref 4.8–5.6)

## 2017-05-06 LAB — VITAMIN D 25 HYDROXY (VIT D DEFICIENCY, FRACTURES): Vit D, 25-Hydroxy: 38.7 ng/mL (ref 30.0–100.0)

## 2017-05-06 LAB — VITAMIN B12: Vitamin B-12: 418 pg/mL (ref 232–1245)

## 2017-05-06 LAB — T4, FREE: Free T4: 1.16 ng/dL (ref 0.82–1.77)

## 2017-05-06 LAB — TSH: TSH: 2.2 u[IU]/mL (ref 0.450–4.500)

## 2017-05-18 ENCOUNTER — Ambulatory Visit (INDEPENDENT_AMBULATORY_CARE_PROVIDER_SITE_OTHER): Payer: Medicare Other | Admitting: Family Medicine

## 2017-05-18 ENCOUNTER — Encounter: Payer: Self-pay | Admitting: Family Medicine

## 2017-05-18 VITALS — BP 126/74 | HR 66 | Ht 68.75 in | Wt 187.7 lb

## 2017-05-18 DIAGNOSIS — E7849 Other hyperlipidemia: Secondary | ICD-10-CM

## 2017-05-18 DIAGNOSIS — E663 Overweight: Secondary | ICD-10-CM

## 2017-05-18 DIAGNOSIS — E784 Other hyperlipidemia: Secondary | ICD-10-CM

## 2017-05-18 DIAGNOSIS — E559 Vitamin D deficiency, unspecified: Secondary | ICD-10-CM

## 2017-05-18 DIAGNOSIS — I10 Essential (primary) hypertension: Secondary | ICD-10-CM

## 2017-05-18 NOTE — Progress Notes (Signed)
Assessment and plan:  1. Other hyperlipidemia   2. Essential hypertension   3. Vitamin D insufficiency   4. Overweight (BMI 25.0-29.9)     No problem-specific Assessment & Plan notes found for this encounter.     Meds ordered this encounter  Medications  . Cholecalciferol (VITAMIN D3) 5000 units TABS    Sig: 5,000 IU OTC vitamin D3 daily.    Dispense:  90 tablet    Refill:  10  . sertraline (ZOLOFT) 25 MG tablet    Sig: Take 1 tablet (25 mg total) by mouth daily.    Dispense:  90 tablet    Refill:  1  . pravastatin (PRAVACHOL) 20 MG tablet    Sig: Take 1 tablet (20 mg total) by mouth at bedtime.    Dispense:  90 tablet    Refill:  1     Discontinued Medications   No medications on file      No orders of the defined types were placed in this encounter.    No Follow-up on file.  Anticipatory guidance and routine counseling done re: condition, txmnt options and need for follow up. All questions of patient's were answered.   Gross side effects, risk and benefits, and alternatives of medications discussed with patient.  Patient is aware that all medications have potential side effects and we are unable to predict every sideeffect or drug-drug interaction that may occur.  Expresses verbal understanding and consents to current therapy plan and treatment regiment.  Please see AVS handed out to patient at the end of our visit for additional patient instructions/ counseling done pertaining to today's office visit.  Note: This document was prepared using Dragon voice recognition software and may include unintentional dictation errors.   ----------------------------------------------------------------------------------------------------------------------  Subjective:   CC:   Carla Lewis is a 71 y.o. female who presents to [place] at [facility] today for review and discussion of  recent bloodwork that was done.  1. All recent blood work that we ordered was reviewed with patient today.  Patient was counseled on all abnormalities and we discussed dietary and lifestyle changes that could help those values (also medications when appropriate).  Extensive health counseling performed and all patient's concerns/ questions were addressed.     Wt Readings from Last 3 Encounters:  05/18/17 187 lb 11.2 oz (85.1 kg)  02/23/17 190 lb 12.8 oz (86.5 kg)  02/10/17 191 lb 1.6 oz (86.7 kg)   BP Readings from Last 3 Encounters:  05/18/17 126/74  02/23/17 104/60  02/10/17 99/62   Pulse Readings from Last 3 Encounters:  05/18/17 66  02/23/17 75  02/10/17 82   BMI Readings from Last 3 Encounters:  05/18/17 27.92 kg/m  02/23/17 28.38 kg/m  02/10/17 28.43 kg/m     Patient Care Team    Relationship Specialty Notifications Start End  Mellody Dance, DO PCP - General Family Medicine  08/11/16   Martinique, Peter M, [place] Physician Cardiology  08/11/16   Elsie Saas, MD Consulting Physician Orthopedic Surgery  08/11/16   Juanita Craver, MD Consulting Physician Gastroenterology  08/11/16   Paula Compton, MD Consulting Physician Obstetrics and Gynecology  08/11/16   Specialists, Dermatology  Dermatology  02/28/17    Comment: yrly exams there  Jackolyn Confer, MD Consulting Physician General Surgery  05/18/17     Full medical history updated and reviewed in the office today  Patient Active Problem List   Diagnosis Date Noted  .  Carotid stenosis 12/19/2013    Priority: High  . Anxiety     Priority: High  . Hypertension     Priority: High  . Hyperlipidemia     Priority: High  . GERD (gastroesophageal reflux disease)     Priority: Medium  . Excessive Burping 02/23/2017  . Dysphagia 02/23/2017  . Vitamin D insufficiency 08/11/2016  . Environmental and seasonal allergies 08/11/2016  . Overweight (BMI 25.0-29.9) 08/11/2016  . h/o Palpitations   . Liver cyst    . LVH (left ventricular hypertrophy)     Past Medical History:  Diagnosis Date  . Ankle bruise    SPONTANEOUS BRUISE ON RIGHT ANKLE  . Anxiety   . GERD (gastroesophageal reflux disease)   . Hyperlipidemia   . Hypertension   . Liver cyst   . LVH (left ventricular hypertrophy)    MILD LVH per echo in 2007  . Mitral valve regurgitation   . Obesity   . Palpitations     Past Surgical History:  Procedure Laterality Date  . BREAST LUMPECTOMY Right 1995   RIGHT BREAST  . CHOLECYSTECTOMY    . LIPOMA EXCISION    . MENISCUS REPAIR Left   . US ECHOCARDIOGRAPHY  2007   SHOWED EF OF 55-60%    Social History  Substance Use Topics  . Smoking status: Never Smoker  . Smokeless tobacco: Never Used  . Alcohol use No    Family Hx: Family History  Problem Relation Age of Onset  . Heart disease Father 60  . Hypertension Father 56  . AAA (abdominal aortic aneurysm) Father   . Dementia Mother   . Heart disease Mother        cardiomyopathy     Medications: Current Outpatient Prescriptions  Medication Sig Dispense Refill  . APPLE CIDER VINEGAR PO Take by mouth daily.    Marland Kitchen aspirin 81 MG tablet Take 81 mg by mouth daily.      . cetirizine (ZYRTEC) 10 MG tablet Take 10 mg by mouth daily.    Marland Kitchen docusate sodium (COLACE) 100 MG capsule Take 200 mg by mouth daily.    Marland Kitchen esomeprazole (NEXIUM) 20 MG capsule Take 20 mg by mouth daily at 12 noon.    . fluticasone (FLONASE) 50 MCG/ACT nasal spray Place 2 sprays into both nostrils daily.     . Glucosamine-Chondroitin (MOVE FREE PO) Take 2 capsules by mouth daily.     Marland Kitchen lisinopril-hydrochlorothiazide (PRINZIDE,ZESTORETIC) 20-25 MG tablet Take 1 tablet by mouth daily. 90 tablet 3  . pravastatin (PRAVACHOL) 20 MG tablet Take 1 tablet (20 mg total) by mouth at bedtime. 90 tablet 1  . sertraline (ZOLOFT) 25 MG tablet Take 1 tablet (25 mg total) by mouth daily. 90 tablet 1  . Wheat Dextrin (BENEFIBER PO) Take by mouth daily. TAKE 2 DAILY    .  Cholecalciferol (VITAMIN D3) 5000 units TABS 5,000 IU OTC vitamin D3 daily. 90 tablet 10   No current facility-administered medications for this visit.     Allergies:  Allergies  Allergen Reactions  . Darvon [Propoxyphene] Anaphylaxis  . Bystolic [Nebivolol Hcl]     No energy and lightheadedness.  . Codeine   . Diovan [Valsartan]     Anxiety  . Sulfa Drugs Cross Reactors      Review of Systems: General:   No F/C, wt loss Pulm:   No DIB, SOB, pleuritic chest pain Card:  No CP, palpitations Abd:  No n/v/d or pain Ext:  No inc edema from  baseline  Objective:  Blood pressure 126/74, pulse 66, height 5' 8.75" (1.746 m), weight 187 lb 11.2 oz (85.1 kg). Body mass index is 27.92 kg/m. Gen:   Well NAD, A and O *3 HEENT:    [place]/AT, EOMI,  MMM Lungs:   Normal work of breathing. CTA B/L, no Wh, rhonchi Heart:   RRR, S1, S2 WNL's, no MRG Abd:   No gross distention Exts:    warm, pink,  Brisk capillary refill, warm and well perfused.  Psych:    No HI/SI, judgement and insight good, Euthymic mood. Full Affect.   Recent Results (from the past 2160 hour(s))  CBC with Differential/Platelet     Status: None   Collection Time: 05/05/17  8:50 AM  Result Value Ref Range   WBC 5.2 3.4 - 10.8 x10E3/uL   RBC 4.99 3.77 - 5.28 x10E6/uL   Hemoglobin 14.2 11.1 - 15.9 g/dL   Hematocrit 42.9 34.0 - 46.6 %   MCV 86 79 - 97 fL   MCH 28.5 26.6 - 33.0 pg   MCHC 33.1 31.5 - 35.7 g/dL   RDW 13.8 12.3 - 15.4 %   Platelets 371 150 - 379 x10E3/uL   Neutrophils 55 Not Estab. %   Lymphs 32 Not Estab. %   Monocytes 10 Not Estab. %   Eos 2 Not Estab. %   Basos 1 Not Estab. %   Neutrophils Absolute 2.9 1.4 - 7.0 x10E3/uL   Lymphocytes Absolute 1.7 0.7 - 3.1 x10E3/uL   Monocytes Absolute 0.5 0.1 - 0.9 x10E3/uL   EOS (ABSOLUTE) 0.1 0.0 - 0.4 x10E3/uL   Basophils Absolute 0.1 0.0 - 0.2 x10E3/uL   Immature Granulocytes 0 Not Estab. %   Immature Grans (Abs) 0.0 0.0 - 0.1 x10E3/uL  T4, free     Status:  None   Collection Time: 05/05/17  8:50 AM  Result Value Ref Range   Free T4 1.16 0.82 - 1.77 ng/dL  Comprehensive metabolic panel     Status: None   Collection Time: 05/05/17  8:50 AM  Result Value Ref Range   Glucose 91 65 - 99 mg/dL   BUN 10 8 - 27 mg/dL   Creatinine, Ser 0.71 0.57 - 1.00 mg/dL   GFR calc non Af Amer 86 >59 mL/min/1.73   GFR calc Af Amer 99 >59 mL/min/1.73   BUN/Creatinine Ratio 14 12 - 28   Sodium 136 134 - 144 mmol/L   Potassium 4.4 3.5 - 5.2 mmol/L   Chloride 96 96 - 106 mmol/L   CO2 23 20 - 29 mmol/L   Calcium 9.3 8.7 - 10.3 mg/dL   Total Protein 6.4 6.0 - 8.5 g/dL   Albumin 4.2 3.5 - 4.8 g/dL   Globulin, Total 2.2 1.5 - 4.5 g/dL   Albumin/Globulin Ratio 1.9 1.2 - 2.2   Bilirubin Total 0.4 0.0 - 1.2 mg/dL   Alkaline Phosphatase 75 39 - 117 IU/L   AST 21 0 - 40 IU/L   ALT 13 0 - 32 IU/L  Hemoglobin A1c     Status: None   Collection Time: 05/05/17  8:50 AM  Result Value Ref Range   Hgb A1c MFr Bld 5.4 4.8 - 5.6 %    Comment:          Pre-diabetes: 5.7 - 6.4          Diabetes: >6.4          Glycemic control for adults with diabetes: <7.0    Est. average glucose  Bld gHb Est-mCnc 108 mg/dL  Lipid panel     Status: None   Collection Time: 05/05/17  8:50 AM  Result Value Ref Range   Cholesterol, Total 166 100 - 199 mg/dL   Triglycerides 77 0 - 149 mg/dL   HDL 53 >39 mg/dL   VLDL Cholesterol Cal 15 5 - 40 mg/dL   LDL Calculated 98 0 - 99 mg/dL   Chol/HDL Ratio 3.1 0.0 - 4.4 ratio    Comment:                                   T. Chol/HDL Ratio                                             Men  Women                               1/2 Avg.Risk  3.4    3.3                                   Avg.Risk  5.0    4.4                                2X Avg.Risk  9.6    7.1                                3X Avg.Risk 23.4   11.0   VITAMIN D 25 Hydroxy (Vit-D Deficiency, Fractures)     Status: None   Collection Time: 05/05/17  8:50 AM  Result Value Ref Range   Vit  D, 25-Hydroxy 38.7 30.0 - 100.0 ng/mL    Comment: Vitamin D deficiency has been defined by the [place] practice guideline as a level of serum 25-OH vitamin D less than 20 ng/mL (1,2). The Endocrine Society went on to further define vitamin D insufficiency as a level between 21 and 29 ng/mL (2). 1. IOM (Institute of Medicine). 2010. Dietary reference    intakes for calcium and D. [place]: The    [employer]. 2. Holick MF, Binkley [place], Bischoff-Ferrari HA, et al.    Evaluation, treatment, and prevention of vitamin D    deficiency: an Endocrine Society clinical practice    guideline. JCEM. 2011 Jul; 96(7):1911-30.   TSH     Status: None   Collection Time: 05/05/17  8:50 AM  Result Value Ref Range   TSH 2.200 0.450 - 4.500 uIU/mL  Vitamin B12     Status: None   Collection Time: 05/05/17  8:50 AM  Result Value Ref Range   Vitamin B-12 418 232 - 1,245 pg/mL

## 2017-05-18 NOTE — Patient Instructions (Signed)
Dr Jorene Guest [place]

## 2017-05-20 ENCOUNTER — Telehealth: Payer: Self-pay | Admitting: Family Medicine

## 2017-05-20 NOTE — Telephone Encounter (Signed)
Pt called states Dr. Colman Cater computer froze up/locked up and provider was unable to complete Rx for meds & possible referral , she was calling back to see if Rx ever called into pharmacy & recs charting completed. --glh

## 2017-05-20 NOTE — Telephone Encounter (Signed)
Dr Raliegh Scarlet can you please check to see if you finished her chart and sent in medications discussed at appointment.  Thanks.  MPulliam, CMA/RT(R)

## 2017-05-21 MED ORDER — SERTRALINE HCL 25 MG PO TABS: 25.0000 mg | ORAL_TABLET | Freq: Every day | ORAL | 1 refills | Status: DC

## 2017-05-21 MED ORDER — PRAVASTATIN SODIUM 20 MG PO TABS: 20.0000 mg | ORAL_TABLET | Freq: Every day | ORAL | 1 refills | Status: DC

## 2017-05-21 MED ORDER — VITAMIN D3 125 MCG (5000 UT) PO TABS: ORAL_TABLET | ORAL | 10 refills | Status: DC

## 2017-05-25 ENCOUNTER — Ambulatory Visit: Payer: Medicare Other | Admitting: Family Medicine

## 2017-05-25 ENCOUNTER — Telehealth: Payer: Self-pay | Admitting: Family Medicine

## 2017-05-25 DIAGNOSIS — K449 Diaphragmatic hernia without obstruction or gangrene: Secondary | ICD-10-CM | POA: Diagnosis not present

## 2017-05-25 DIAGNOSIS — K219 Gastro-esophageal reflux disease without esophagitis: Secondary | ICD-10-CM | POA: Diagnosis not present

## 2017-05-25 NOTE — Telephone Encounter (Signed)
Patient states she has yet to receive the cream that Dr. Jenetta Downer discussed at last [place]. Please advise.

## 2017-05-26 ENCOUNTER — Other Ambulatory Visit: Payer: Self-pay | Admitting: Family Medicine

## 2017-05-26 DIAGNOSIS — L219 Seborrheic dermatitis, unspecified: Secondary | ICD-10-CM

## 2017-05-26 MED ORDER — NYSTATIN-TRIAMCINOLONE 100000-0.1 UNIT/GM-% EX OINT: TOPICAL_OINTMENT | CUTANEOUS | 0 refills | Status: DC

## 2017-05-26 MED ORDER — KETOCONAZOLE 2 % EX SHAM: MEDICATED_SHAMPOO | CUTANEOUS | 3 refills | Status: DC

## 2017-05-26 NOTE — Telephone Encounter (Signed)
Please let patient know the cream and shampoo were both called in and she can pick up at her convenience at the pharmacy she had listed in her medical chart

## 2017-05-27 NOTE — Telephone Encounter (Signed)
Called patient and notified. MPulliam, CMA/RT(R)  

## 2017-06-23 ENCOUNTER — Encounter (HOSPITAL_COMMUNITY)
Admission: RE | Disposition: A | Discharge status: Home / Self Care | Payer: Self-pay | Source: Ambulatory Visit | Attending: Gastroenterology

## 2017-06-23 ENCOUNTER — Ambulatory Visit (HOSPITAL_COMMUNITY)
Admission: RE | Admit: 2017-06-23 | Discharge: 2017-06-23 | Disposition: A | Discharge status: Home / Self Care | Payer: Medicare Other | Source: Ambulatory Visit | Attending: Gastroenterology | Admitting: Gastroenterology

## 2017-06-23 ENCOUNTER — Encounter (HOSPITAL_COMMUNITY): Payer: Self-pay

## 2017-06-23 DIAGNOSIS — R1310 Dysphagia, unspecified: Secondary | ICD-10-CM | POA: Insufficient documentation

## 2017-06-23 HISTORY — PX: ESOPHAGEAL MANOMETRY: SHX5429

## 2017-06-23 SURGERY — MANOMETRY, ESOPHAGUS

## 2017-06-23 MED ORDER — LIDOCAINE VISCOUS 2 % MT SOLN
OROMUCOSAL | Status: AC
  Filled 2017-06-23: qty 15

## 2017-06-23 SURGICAL SUPPLY — 2 items
FACESHIELD LNG OPTICON STERILE (SAFETY) IMPLANT
GLOVE BIO SURGEON STRL SZ8 (GLOVE) ×6 IMPLANT

## 2017-06-23 NOTE — Progress Notes (Signed)
Esophageal Manometry done per protocol.  Pt. Tolerated well/ without complications.  Dr. Collene Mares to be notified today.    Laverta Baltimore, RN

## 2017-06-24 ENCOUNTER — Encounter (HOSPITAL_COMMUNITY): Payer: Self-pay | Admitting: Gastroenterology

## 2017-06-29 DIAGNOSIS — K219 Gastro-esophageal reflux disease without esophagitis: Secondary | ICD-10-CM | POA: Diagnosis not present

## 2017-06-29 DIAGNOSIS — R1310 Dysphagia, unspecified: Secondary | ICD-10-CM | POA: Diagnosis not present

## 2017-07-30 ENCOUNTER — Other Ambulatory Visit: Payer: Self-pay | Admitting: Sports Medicine

## 2017-07-30 DIAGNOSIS — M47816 Spondylosis without myelopathy or radiculopathy, lumbar region: Secondary | ICD-10-CM | POA: Diagnosis not present

## 2017-07-30 DIAGNOSIS — M25552 Pain in left hip: Secondary | ICD-10-CM | POA: Diagnosis not present

## 2017-07-30 DIAGNOSIS — M545 Low back pain: Secondary | ICD-10-CM

## 2017-08-02 ENCOUNTER — Ambulatory Visit (INDEPENDENT_AMBULATORY_CARE_PROVIDER_SITE_OTHER): Payer: Medicare Other | Admitting: Family Medicine

## 2017-08-02 ENCOUNTER — Encounter: Payer: Self-pay | Admitting: Family Medicine

## 2017-08-02 VITALS — BP 144/78 | HR 68 | Ht 68.75 in | Wt 191.3 lb

## 2017-08-02 DIAGNOSIS — I6529 Occlusion and stenosis of unspecified carotid artery: Secondary | ICD-10-CM | POA: Diagnosis not present

## 2017-08-02 DIAGNOSIS — M5416 Radiculopathy, lumbar region: Secondary | ICD-10-CM | POA: Diagnosis not present

## 2017-08-02 DIAGNOSIS — M47819 Spondylosis without myelopathy or radiculopathy, site unspecified: Secondary | ICD-10-CM

## 2017-08-02 DIAGNOSIS — J0141 Acute recurrent pansinusitis: Secondary | ICD-10-CM | POA: Diagnosis not present

## 2017-08-02 DIAGNOSIS — M159 Polyosteoarthritis, unspecified: Secondary | ICD-10-CM

## 2017-08-02 DIAGNOSIS — J3089 Other allergic rhinitis: Secondary | ICD-10-CM

## 2017-08-02 DIAGNOSIS — M5442 Lumbago with sciatica, left side: Secondary | ICD-10-CM | POA: Diagnosis not present

## 2017-08-02 DIAGNOSIS — R05 Cough: Secondary | ICD-10-CM | POA: Diagnosis not present

## 2017-08-02 DIAGNOSIS — G8929 Other chronic pain: Secondary | ICD-10-CM

## 2017-08-02 DIAGNOSIS — R059 Cough, unspecified: Secondary | ICD-10-CM

## 2017-08-02 MED ORDER — BENZONATATE 100 MG PO CAPS: 100.0000 mg | ORAL_CAPSULE | Freq: Three times a day (TID) | ORAL | 0 refills | Status: AC | PRN

## 2017-08-02 MED ORDER — MONTELUKAST SODIUM 10 MG PO TABS: 10.0000 mg | ORAL_TABLET | Freq: Every day | ORAL | 3 refills | Status: DC

## 2017-08-02 MED ORDER — MELOXICAM 7.5 MG PO TABS: ORAL_TABLET | ORAL | 0 refills | Status: DC

## 2017-08-02 MED ORDER — AMOXICILLIN 875 MG PO TABS: 875.0000 mg | ORAL_TABLET | Freq: Two times a day (BID) | ORAL | 0 refills | Status: DC

## 2017-08-02 NOTE — Progress Notes (Signed)
Acute Care Office visit  Assessment and plan:  1. Acute recurrent pansinusitis   2. Chronic left-sided low back pain with left-sided sciatica   3. Lumbar radiculopathy, chronic   4. Osteoarthritis of multiple joints, unspecified osteoarthritis type   5. Facet arthropathy   6. Coughing   7. Environmental and seasonal allergies   Continue Zyrtec, Flonase daily.  Please make sure you use Flonase after sinus rinses with Ayr or Milta Deiters med sinus rinse is performed.  Do this twice daily followed by sprays in your nostril of the Flonase  - We will add Singulair to your regiment for your allergies in hopes this may help your sinus congestion and eustachian tube dysfunction.    - Patient declines steroids systemically.  ---> Only use antibiotics if you develop one-sided face pain and fever chills.  Gave her prescriptions and she is going out of town.   - Also for your low back pain and radicular symptoms; prescription of Mobic and Lidoderm patches given.  Please call your orthopedist about starting something such as Neurontin or Lyrica.  Pt was in the office today for 25+ minutes, with over 50% time spent in face to face counseling of patient's various medical conditions and in coordination of care   Meds ordered this encounter  Medications  . meloxicam (MOBIC) 7.5 MG tablet    Sig: 1-2 tablets daily as needed for arthritic pain    Dispense:  60 tablet    Refill:  0  . benzonatate (TESSALON) 100 MG capsule    Sig: Take 1 capsule (100 mg total) by mouth 3 (three) times daily as needed for cough.    Dispense:  40 capsule    Refill:  0  . montelukast (SINGULAIR) 10 MG tablet    Sig: Take 1 tablet (10 mg total) by mouth at bedtime.    Dispense:  30 tablet    Refill:  3  . amoxicillin (AMOXIL) 875 MG tablet    Sig: Take 1 tablet (875 mg total) by mouth 2 (two) times daily.    Dispense:  20 tablet    Refill:  0     Meds ordered this encounter  Medications  . meloxicam (MOBIC) 7.5  MG tablet    Sig: 1-2 tablets daily as needed for arthritic pain    Dispense:  60 tablet    Refill:  0  . benzonatate (TESSALON) 100 MG capsule    Sig: Take 1 capsule (100 mg total) by mouth 3 (three) times daily as needed for cough.    Dispense:  40 capsule    Refill:  0  . montelukast (SINGULAIR) 10 MG tablet    Sig: Take 1 tablet (10 mg total) by mouth at bedtime.    Dispense:  30 tablet    Refill:  3  . amoxicillin (AMOXIL) 875 MG tablet    Sig: Take 1 tablet (875 mg total) by mouth 2 (two) times daily.    Dispense:  20 tablet    Refill:  0    Modified Medications   No medications on file    Discontinued Medications   No medications on file      No orders of the defined types were placed in this encounter.    Gross side effects, risk and benefits, and alternatives of medications discussed with patient.  Patient is aware that all medications have potential side effects and we are unable to predict every sideeffect or drug-drug interaction that may occur.  Expresses  verbal understanding and consents to current therapy plan and treatment regiment.  Return if symptoms worsen or fail to improve.  Please see AVS handed out to patient at the end of our visit for additional patient instructions/ counseling done pertaining to today's office visit.  Note: This document was prepared using Dragon voice recognition software and may include unintentional dictation errors.    Subjective:    Chief Complaint  Patient presents with  . Sinus Problem    sinus congestion, post nasal drainage     HPI:  Pt presents with Sx for 10  Days   Sinus congestion and pressure.  She is Pressure in her ureters consistent with eustachian tube dysfunction she's had in the past and seen by ENT for.  They prescribed Flonase and Zyrtec.  Patient is going on an airplane later on this week and has had symptoms for 10 days now worsening.  Wondering what she can do.  No one-sided face pain, no fever  chills.  Also having postnasal drip at night and some coughing.  - She continues to take Zyrtec, Flonase daily.  Not doing nasal rinses or sinus rinses.  Also has concerns of her left lower extremity radicular pain from her back. Goes already down to her foot.  She seeing Raliegh Ip for this.  However recently seen and they did not prescribe a long-term anti-inflammatory pain medicine for her.  Patient is wondering if meloxicam might help.  She is also taking over-the-counter Lidoderm patches which are helping some but not a lot.  She is wondering if Lidoderm patches prescription strength could help. She is scheduled for a MRI of her L-spine and in 2-3 weeks.  Denies:  objective F/C,   No face pain or ear pain,   No N/V/D,   No SOB/DIB, no Cough or  Pleuritic CP,  No Rash.    For symptoms patient has tried:    Overall getting:     No problems updated.   Past medical history, Surgical history, Family history reviewed and noted below, Social history, Allergies, and Medications have been entered into the medical record, reviewed and changed as needed.   Allergies  Allergen Reactions  . Darvon [Propoxyphene] Anaphylaxis  . Bystolic [Nebivolol Hcl]     No energy and lightheadedness.  . Codeine   . Diovan [Valsartan]     Anxiety  . Sulfa Drugs Cross Reactors     Review of Systems: General:   No F/C, wt loss Pulm:   No DIB, pleuritic chest pain Card:  No CP, palpitations Abd:  No n/v/d or pain Ext:  No inc edema from baseline   Objective:   Blood pressure (!) 144/78, pulse 68, height 5' 8.75" (1.746 m), weight 191 lb 4.8 oz (86.8 kg). Body mass index is 28.46 kg/m. General: Well Developed, well nourished, appropriate for stated age.  Neuro: Alert and oriented x3, extra-ocular muscles intact, sensation grossly intact.  HEENT: Normocephalic, atraumatic, pupils equal round reactive to light, neck supple, no masses, no painful lymphadenopathy, TM's intact B/L, no acute findings.  Nares- patent, clear d/c, OP- clear, mild erythema, No TTP sinuses Skin: Warm and dry, no gross rash. Cardiac: RRR, S1 S2,  no murmurs rubs or gallops.  Respiratory: ECTA B/L and A/P, Not using accessory muscles, speaking in full sentences- unlabored. Vascular:  No gross lower ext edema, cap RF less 2 sec. Psych: No HI/SI, judgement and insight good, Euthymic mood. Full Affect.   Patient Care Team    Relationship  Specialty Notifications Start End  Mellody Dance, DO PCP - General Family Medicine  08/11/16   Martinique, Peter M, MD Consulting Physician Cardiology  08/11/16   Elsie Saas, MD Consulting Physician Orthopedic Surgery  08/11/16   Juanita Craver, MD Consulting Physician Gastroenterology  08/11/16   Paula Compton, MD Consulting Physician Obstetrics and Gynecology  08/11/16   Specialists, Dermatology  Dermatology  02/28/17    Comment: yrly exams there  Jackolyn Confer, MD Consulting Physician General Surgery  05/18/17

## 2017-08-02 NOTE — Patient Instructions (Addendum)
Continue Zyrtec, Flonase daily.  Please make sure you use Flonase after sinus rinses with Ayr or Milta Deiters med sinus rinse is performed.  Do this twice daily followed by sprays in your nostril of the Flonase  - We will add Singulair to your regiment for your allergies in hopes this may help your sinus congestion and eustachian tube dysfunction.    - Patient declines steroids systemically.  ---> Only use antibiotics if you develop one-sided face pain and fever chills.  Gave her prescriptions and she is going out of town.   - Also for your low back pain and radicular symptoms; prescription of Mobic and Lidoderm patches given.  Please call your orthopedist about starting something such as Neurontin or Lyrica.     TENS UNIT: This is helpful for muscle pain and spasm.   Search and Purchase a TENS 7000 2nd edition at www.tenspros.com. It should be less than $30.     TENS unit instructions: Do not shower or bathe with the unit on Turn the unit off before removing electrodes or batteries If the electrodes lose stickiness add a drop of water to the electrodes after they are disconnected from the unit and place on plastic sheet. If you continued to have difficulty, call the TENS unit company to purchase more electrodes. Do not apply lotion on the skin area prior to use. Make sure the skin is clean and dry as this will help prolong the life of the electrodes. After use, always check skin for unusual red areas, rash or other skin difficulties. If there are any skin problems, does not apply electrodes to the same area. Never remove the electrodes from the unit by pulling the wires. Do not use the TENS unit or electrodes other than as directed. Do not change electrode placement without consultating your therapist or physician. Keep 2 fingers with between each electrode. Wear time ratio is 2:1, on to off times.    For example on for 30 minutes off for 15 minutes and then on for 30 minutes off for 15  minutes

## 2017-08-16 ENCOUNTER — Ambulatory Visit
Admission: RE | Admit: 2017-08-16 | Discharge: 2017-08-16 | Disposition: A | Discharge status: Home / Self Care | Payer: Medicare Other | Source: Ambulatory Visit | Attending: Sports Medicine | Admitting: Sports Medicine

## 2017-08-16 DIAGNOSIS — M545 Low back pain: Secondary | ICD-10-CM

## 2017-08-16 DIAGNOSIS — M48061 Spinal stenosis, lumbar region without neurogenic claudication: Secondary | ICD-10-CM | POA: Diagnosis not present

## 2017-08-17 DIAGNOSIS — H52223 Regular astigmatism, bilateral: Secondary | ICD-10-CM | POA: Diagnosis not present

## 2017-08-17 DIAGNOSIS — H524 Presbyopia: Secondary | ICD-10-CM | POA: Diagnosis not present

## 2017-08-17 DIAGNOSIS — H5213 Myopia, bilateral: Secondary | ICD-10-CM | POA: Diagnosis not present

## 2017-08-17 DIAGNOSIS — H1045 Other chronic allergic conjunctivitis: Secondary | ICD-10-CM | POA: Diagnosis not present

## 2017-08-23 ENCOUNTER — Other Ambulatory Visit: Payer: Medicare Other

## 2017-08-23 DIAGNOSIS — K449 Diaphragmatic hernia without obstruction or gangrene: Secondary | ICD-10-CM | POA: Diagnosis not present

## 2017-08-23 DIAGNOSIS — K219 Gastro-esophageal reflux disease without esophagitis: Secondary | ICD-10-CM | POA: Diagnosis not present

## 2017-08-24 ENCOUNTER — Other Ambulatory Visit: Payer: Self-pay | Admitting: Family Medicine

## 2017-08-24 DIAGNOSIS — M545 Low back pain: Principal | ICD-10-CM

## 2017-08-24 DIAGNOSIS — G8929 Other chronic pain: Secondary | ICD-10-CM

## 2017-08-24 NOTE — Telephone Encounter (Signed)
Ok to rf/ call in 10mg  tid prn, disp #30, no rf

## 2017-08-24 NOTE — Telephone Encounter (Signed)
Please advise.  T. Nelson, CMA 

## 2017-08-24 NOTE — Telephone Encounter (Signed)
Patient called state still having pain and waiting on appt  With Neurologist/ Dr. Rita Ohara but request PCP call in Rx for Flexaril to [facility] [place] [facility] possible--please call pt at [phone] if questions.  --glh

## 2017-08-25 MED ORDER — CYCLOBENZAPRINE HCL 10 MG PO TABS: 10.0000 mg | ORAL_TABLET | Freq: Three times a day (TID) | ORAL | 0 refills | Status: DC | PRN

## 2017-08-25 NOTE — Addendum Note (Signed)
Addended by: Lanier Prude D on: 08/25/2017 11:54 AM   Modules accepted: Orders

## 2017-08-28 ENCOUNTER — Other Ambulatory Visit: Payer: Self-pay | Admitting: Family Medicine

## 2017-08-28 DIAGNOSIS — M5416 Radiculopathy, lumbar region: Secondary | ICD-10-CM

## 2017-08-28 DIAGNOSIS — M159 Polyosteoarthritis, unspecified: Secondary | ICD-10-CM

## 2017-08-28 DIAGNOSIS — M47819 Spondylosis without myelopathy or radiculopathy, site unspecified: Secondary | ICD-10-CM

## 2017-08-28 DIAGNOSIS — G8929 Other chronic pain: Secondary | ICD-10-CM

## 2017-08-28 DIAGNOSIS — M5442 Lumbago with sciatica, left side: Principal | ICD-10-CM

## 2017-08-30 ENCOUNTER — Other Ambulatory Visit: Payer: Self-pay | Admitting: Neurosurgery

## 2017-08-30 DIAGNOSIS — M546 Pain in thoracic spine: Secondary | ICD-10-CM | POA: Diagnosis not present

## 2017-08-30 DIAGNOSIS — M47816 Spondylosis without myelopathy or radiculopathy, lumbar region: Secondary | ICD-10-CM | POA: Diagnosis not present

## 2017-08-30 DIAGNOSIS — M549 Dorsalgia, unspecified: Secondary | ICD-10-CM | POA: Diagnosis not present

## 2017-08-30 DIAGNOSIS — M5136 Other intervertebral disc degeneration, lumbar region: Secondary | ICD-10-CM | POA: Diagnosis not present

## 2017-08-30 DIAGNOSIS — M4155 Other secondary scoliosis, thoracolumbar region: Secondary | ICD-10-CM | POA: Diagnosis not present

## 2017-08-30 DIAGNOSIS — M48062 Spinal stenosis, lumbar region with neurogenic claudication: Secondary | ICD-10-CM | POA: Diagnosis not present

## 2017-08-30 DIAGNOSIS — Z6829 Body mass index (BMI) 29.0-29.9, adult: Secondary | ICD-10-CM | POA: Diagnosis not present

## 2017-08-30 DIAGNOSIS — I10 Essential (primary) hypertension: Secondary | ICD-10-CM | POA: Diagnosis not present

## 2017-08-30 DIAGNOSIS — M4316 Spondylolisthesis, lumbar region: Secondary | ICD-10-CM | POA: Diagnosis not present

## 2017-09-13 DIAGNOSIS — K449 Diaphragmatic hernia without obstruction or gangrene: Secondary | ICD-10-CM | POA: Diagnosis not present

## 2017-10-06 NOTE — Pre-Procedure Instructions (Signed)
Carla Lewis  10/06/2017      [id number] Carla Lewis, [place] [address] Phone: [phone] Fax: [phone]    Your procedure is scheduled on  Wednesday 10/20/17  Report to [facility] Admitting at 630 A.M.  Call this number if you have problems the morning of surgery:  [phone]   Remember:  Do not eat food or drink liquids after midnight.  Take these medicines the morning of surgery with A SIP OF WATER  -                  TYLENOL IF NEEDED                   NEXIUM                   SERTRALINE (ZOLOFT)  7 days prior to surgery STOP taking any Aspirin(unless otherwise instructed by your surgeon), Aleve, Naproxen, Ibuprofen, Motrin, Advil, Goody's, BC's, all herbal medications, fish oil, and all vitamins   Do not wear jewelry, make-up or nail polish.  Do not wear lotions, powders, or perfumes, or deodorant.  Do not shave 48 hours prior to surgery.  Men may shave face and neck.  Do not bring valuables to the hospital.  [facility] is not responsible for any belongings or valuables.  Contacts, dentures or bridgework may not be worn into surgery.  Leave your suitcase in the car.  After surgery it may be brought to your room.  For patients admitted to the hospital, discharge time will be determined by your treatment team.  Patients discharged the day of surgery will not be allowed to drive home.   Name and phone number of your driver:    Special instructions:  [facility] - Preparing for Surgery  Before surgery, you can play an important role.  Because skin is not sterile, your skin needs to be as free of germs as possible.  You can reduce the number of germs on you skin by washing with CHG (chlorahexidine gluconate) soap before surgery.  CHG is an antiseptic cleaner which kills germs and bonds with the skin to continue killing germs even after washing.  Please DO NOT use if  you have an allergy to CHG or antibacterial soaps.  If your skin becomes reddened/irritated stop using the CHG and inform your nurse when you arrive at Short Stay.  Do not shave (including legs and underarms) for at least 48 hours prior to the first CHG shower.  You may shave your face.  Please follow these instructions carefully:   1.  Shower with CHG Soap the night before surgery and the                                morning of Surgery.  2.  If you choose to wash your hair, wash your hair first as usual with your       normal shampoo.  3.  After you shampoo, rinse your hair and body thoroughly to remove the                      Shampoo.  4.  Use CHG as you would any other liquid soap.  You can apply chg directly       to the skin and wash gently with scrungie  or a clean washcloth.  5.  Apply the CHG Soap to your body ONLY FROM THE NECK DOWN.        Do not use on open wounds or open sores.  Avoid contact with your eyes,       ears, mouth and genitals (private parts).  Wash genitals (private parts)       with your normal soap.  6.  Wash thoroughly, paying special attention to the area where your surgery        will be performed.  7.  Thoroughly rinse your body with warm water from the neck down.  8.  DO NOT shower/wash with your normal soap after using and rinsing off       the CHG Soap.  9.  Pat yourself dry with a clean towel.            10.  Wear clean pajamas.            11.  Place clean sheets on your bed the night of your first shower and do not        sleep with pets.  Day of Surgery  Do not apply any lotions/deoderants the morning of surgery.  Please wear clean clothes to the hospital/surgery center.    Please read over the following fact sheets that you were given. MRSA Information and Surgical Site Infection Prevention

## 2017-10-07 ENCOUNTER — Other Ambulatory Visit: Payer: Self-pay

## 2017-10-07 ENCOUNTER — Encounter (HOSPITAL_COMMUNITY)
Admission: RE | Admit: 2017-10-07 | Discharge: 2017-10-07 | Disposition: A | Discharge status: Home / Self Care | Payer: Medicare Other | Source: Ambulatory Visit | Attending: Neurosurgery | Admitting: Neurosurgery

## 2017-10-07 ENCOUNTER — Encounter (HOSPITAL_COMMUNITY): Payer: Self-pay

## 2017-10-07 DIAGNOSIS — Z01812 Encounter for preprocedural laboratory examination: Secondary | ICD-10-CM | POA: Diagnosis not present

## 2017-10-07 DIAGNOSIS — Z01810 Encounter for preprocedural cardiovascular examination: Secondary | ICD-10-CM | POA: Insufficient documentation

## 2017-10-07 HISTORY — DX: Nausea with vomiting, unspecified: R11.2

## 2017-10-07 HISTORY — DX: Spinal stenosis, lumbar region without neurogenic claudication: M48.061

## 2017-10-07 HISTORY — DX: Personal history of other diseases of the digestive system: Z87.19

## 2017-10-07 HISTORY — DX: Cyst of kidney, acquired: N28.1

## 2017-10-07 HISTORY — DX: Other complications of anesthesia, initial encounter: T88.59XA

## 2017-10-07 HISTORY — DX: Adverse effect of unspecified anesthetic, initial encounter: T41.45XA

## 2017-10-07 HISTORY — DX: Other specified postprocedural states: Z98.890

## 2017-10-07 LAB — BASIC METABOLIC PANEL
ANION GAP: 7 (ref 5–15)
BUN: 12 mg/dL (ref 6–20)
CALCIUM: 9.1 mg/dL (ref 8.9–10.3)
CO2: 27 mmol/L (ref 22–32)
Chloride: 98 mmol/L — ABNORMAL LOW (ref 101–111)
Creatinine, Ser: 0.73 mg/dL (ref 0.44–1.00)
GLUCOSE: 99 mg/dL (ref 65–99)
POTASSIUM: 3.9 mmol/L (ref 3.5–5.1)
Sodium: 132 mmol/L — ABNORMAL LOW (ref 135–145)

## 2017-10-07 LAB — CBC
HEMATOCRIT: 40.1 % (ref 36.0–46.0)
HEMOGLOBIN: 13.8 g/dL (ref 12.0–15.0)
MCH: 29.1 pg (ref 26.0–34.0)
MCHC: 34.4 g/dL (ref 30.0–36.0)
MCV: 84.4 fL (ref 78.0–100.0)
Platelets: 345 10*3/uL (ref 150–400)
RBC: 4.75 MIL/uL (ref 3.87–5.11)
RDW: 12.9 % (ref 11.5–15.5)
WBC: 5.7 10*3/uL (ref 4.0–10.5)

## 2017-10-07 LAB — TYPE AND SCREEN
ABO/RH(D): AB POS
Antibody Screen: NEGATIVE

## 2017-10-07 LAB — SURGICAL PCR SCREEN
MRSA, PCR: NEGATIVE
Staphylococcus aureus: NEGATIVE

## 2017-10-07 LAB — ABO/RH: ABO/RH(D): AB POS

## 2017-10-07 NOTE — Progress Notes (Signed)
PCP - Dr. Raliegh Scarlet - [facility]  Cardiologist - Dr. Peter Martinique and PA Truitt Merle  Chest x-ray - Denies  EKG - 10/07/17  Stress Test - 03/05/06 (E)  ECHO - 03/01/06 (E)  Cardiac Cath - Denies  Sleep Study - No CPAP - None  LABS- 10/07/17: CBC, BMP, T/S   Anesthesia- Yes- Cardiac history  Pt denies having chest pain, sob, or fever at this time. All instructions explained to the pt, with a verbal understanding of the material. Pt agrees to go over the instructions while at home for a better understanding. The opportunity to ask questions was provided.

## 2017-10-08 NOTE — Progress Notes (Signed)
Anesthesia Chart Review:  Pt is a 71 year old female scheduled for L4-5 decompression and PLIF on 10/20/2017 with Jovita Gamma, MD  - PCP is Mellody Dance, DO - Cardiologist is Peter Martinique, MD who sees pt for HTN. Last office visit 08/04/16 with Truitt Merle, NP  PMH includes:  HTN, hyperlipidemia, palpitations, post-op N/V, GERD. Never smoker. BMI 28.5.  Medications include: ASA 81 mg, Nexium, lisinopril-HCTZ, pravastatin  BP (!) 147/76   Pulse 70   Temp 36.6 C   Resp 18   Ht 5' 8.5" (1.74 m)   Wt 190 lb 6.4 oz (86.4 kg)   SpO2 100%   BMI 28.53 kg/m   Preoperative labs reviewed.    EKG 10/07/17: Sinus rhythm with Premature supraventricular complexes and with occasional and consecutive Premature ventricular complexes  Carotid duplex 12/16/16:  - Heterogeneous plaque, bilaterally. - Stable 1-39% bilateral ICA stenosis. - Normal subclavian arteries, bilaterally. - Patent vertebral arteries with antegrade flow  Nuclear stress test 02/27/09: Normal stress nuclear study  Echo 03/01/06 - Mild LVH with diastolic dysfunction - Normal LV systolic function - Mild mitral and tricuspid regurgitation - 3 cysts seen in liver  If no changes, I anticipate pt can proceed with surgery as scheduled.   Willeen Cass, FNP-BC [facility]/Anesthesiology Phone: [phone] 10/08/2017 12:45 PM

## 2017-10-20 ENCOUNTER — Encounter (HOSPITAL_COMMUNITY)
Admission: RE | Disposition: A | Discharge status: Home / Self Care | Payer: Self-pay | Source: Ambulatory Visit | Attending: Neurosurgery

## 2017-10-20 ENCOUNTER — Inpatient Hospital Stay (HOSPITAL_COMMUNITY)
Admission: RE | Admit: 2017-10-20 | Discharge: 2017-10-21 | DRG: 455 | Disposition: A | Discharge status: Home / Self Care | Payer: Medicare Other | Source: Ambulatory Visit | Attending: Neurosurgery | Admitting: Neurosurgery

## 2017-10-20 ENCOUNTER — Other Ambulatory Visit: Payer: Self-pay

## 2017-10-20 ENCOUNTER — Inpatient Hospital Stay (HOSPITAL_COMMUNITY): Payer: Medicare Other | Admitting: Anesthesiology

## 2017-10-20 ENCOUNTER — Inpatient Hospital Stay (HOSPITAL_COMMUNITY): Payer: Medicare Other

## 2017-10-20 ENCOUNTER — Inpatient Hospital Stay (HOSPITAL_COMMUNITY): Payer: Medicare Other | Admitting: Emergency Medicine

## 2017-10-20 ENCOUNTER — Encounter (HOSPITAL_COMMUNITY): Payer: Self-pay | Admitting: *Deleted

## 2017-10-20 DIAGNOSIS — E785 Hyperlipidemia, unspecified: Secondary | ICD-10-CM | POA: Diagnosis not present

## 2017-10-20 DIAGNOSIS — Z888 Allergy status to other drugs, medicaments and biological substances status: Secondary | ICD-10-CM | POA: Diagnosis not present

## 2017-10-20 DIAGNOSIS — K219 Gastro-esophageal reflux disease without esophagitis: Secondary | ICD-10-CM | POA: Diagnosis present

## 2017-10-20 DIAGNOSIS — M48062 Spinal stenosis, lumbar region with neurogenic claudication: Secondary | ICD-10-CM | POA: Diagnosis not present

## 2017-10-20 DIAGNOSIS — Z7982 Long term (current) use of aspirin: Secondary | ICD-10-CM | POA: Diagnosis not present

## 2017-10-20 DIAGNOSIS — Z882 Allergy status to sulfonamides status: Secondary | ICD-10-CM | POA: Diagnosis not present

## 2017-10-20 DIAGNOSIS — F419 Anxiety disorder, unspecified: Secondary | ICD-10-CM | POA: Diagnosis present

## 2017-10-20 DIAGNOSIS — M4326 Fusion of spine, lumbar region: Secondary | ICD-10-CM | POA: Diagnosis not present

## 2017-10-20 DIAGNOSIS — M5136 Other intervertebral disc degeneration, lumbar region: Secondary | ICD-10-CM | POA: Diagnosis present

## 2017-10-20 DIAGNOSIS — Z419 Encounter for procedure for purposes other than remedying health state, unspecified: Secondary | ICD-10-CM

## 2017-10-20 DIAGNOSIS — Z79899 Other long term (current) drug therapy: Secondary | ICD-10-CM

## 2017-10-20 DIAGNOSIS — Z885 Allergy status to narcotic agent status: Secondary | ICD-10-CM | POA: Diagnosis not present

## 2017-10-20 DIAGNOSIS — I739 Peripheral vascular disease, unspecified: Secondary | ICD-10-CM | POA: Diagnosis present

## 2017-10-20 DIAGNOSIS — I10 Essential (primary) hypertension: Secondary | ICD-10-CM | POA: Diagnosis present

## 2017-10-20 DIAGNOSIS — M4316 Spondylolisthesis, lumbar region: Secondary | ICD-10-CM | POA: Diagnosis present

## 2017-10-20 DIAGNOSIS — M47816 Spondylosis without myelopathy or radiculopathy, lumbar region: Secondary | ICD-10-CM | POA: Diagnosis not present

## 2017-10-20 SURGERY — POSTERIOR LUMBAR FUSION 1 LEVEL
Anesthesia: General | Site: Back

## 2017-10-20 MED ORDER — ALUM & MAG HYDROXIDE-SIMETH 200-200-20 MG/5ML PO SUSP: 30.0000 mL | Freq: Four times a day (QID) | ORAL | Status: DC | PRN

## 2017-10-20 MED ORDER — OXYCODONE HCL 5 MG/5ML PO SOLN: 5.0000 mg | Freq: Once | ORAL | Status: DC | PRN

## 2017-10-20 MED ORDER — MORPHINE SULFATE (PF) 4 MG/ML IV SOLN: 4.0000 mg | INTRAVENOUS | Status: DC | PRN

## 2017-10-20 MED ORDER — DEXAMETHASONE SODIUM PHOSPHATE 10 MG/ML IJ SOLN
INTRAMUSCULAR | Status: DC | PRN
  Administered 2017-10-20: 10 mg via INTRAVENOUS

## 2017-10-20 MED ORDER — ONDANSETRON HCL 4 MG/2ML IJ SOLN
INTRAMUSCULAR | Status: DC | PRN
  Administered 2017-10-20: 4 mg via INTRAVENOUS

## 2017-10-20 MED ORDER — LIDOCAINE 2% (20 MG/ML) 5 ML SYRINGE
INTRAMUSCULAR | Status: AC
  Filled 2017-10-20: qty 5

## 2017-10-20 MED ORDER — CEFAZOLIN SODIUM 1 G IJ SOLR
INTRAMUSCULAR | Status: AC
  Filled 2017-10-20: qty 20

## 2017-10-20 MED ORDER — PHENYLEPHRINE 40 MCG/ML (10ML) SYRINGE FOR IV PUSH (FOR BLOOD PRESSURE SUPPORT)
PREFILLED_SYRINGE | INTRAVENOUS | Status: AC
  Filled 2017-10-20: qty 10

## 2017-10-20 MED ORDER — ALBUMIN HUMAN 5 % IV SOLN
INTRAVENOUS | Status: DC | PRN
  Administered 2017-10-20 (×2): via INTRAVENOUS

## 2017-10-20 MED ORDER — ACETAMINOPHEN 650 MG RE SUPP: 650.0000 mg | RECTAL | Status: DC | PRN

## 2017-10-20 MED ORDER — HYDROCHLOROTHIAZIDE 25 MG PO TABS
25.0000 mg | ORAL_TABLET | Freq: Every day | ORAL | Status: DC
  Administered 2017-10-21: 25 mg via ORAL
  Filled 2017-10-20: qty 1

## 2017-10-20 MED ORDER — PANTOPRAZOLE SODIUM 40 MG PO TBEC
40.0000 mg | DELAYED_RELEASE_TABLET | Freq: Every day | ORAL | Status: DC
  Administered 2017-10-21: 40 mg via ORAL
  Filled 2017-10-20: qty 1

## 2017-10-20 MED ORDER — ACETAMINOPHEN 325 MG PO TABS: 650.0000 mg | ORAL_TABLET | ORAL | Status: DC | PRN

## 2017-10-20 MED ORDER — PHENOL 1.4 % MT LIQD: 1.0000 | OROMUCOSAL | Status: DC | PRN

## 2017-10-20 MED ORDER — SCOPOLAMINE 1 MG/3DAYS TD PT72
MEDICATED_PATCH | TRANSDERMAL | Status: DC | PRN
  Administered 2017-10-20: 1 via TRANSDERMAL

## 2017-10-20 MED ORDER — CHLORHEXIDINE GLUCONATE CLOTH 2 % EX PADS: 6.0000 | MEDICATED_PAD | Freq: Once | CUTANEOUS | Status: DC

## 2017-10-20 MED ORDER — PHENYLEPHRINE HCL 10 MG/ML IJ SOLN
INTRAMUSCULAR | Status: AC
  Filled 2017-10-20: qty 1

## 2017-10-20 MED ORDER — PROPOFOL 10 MG/ML IV BOLUS
INTRAVENOUS | Status: AC
  Filled 2017-10-20: qty 20

## 2017-10-20 MED ORDER — EPHEDRINE SULFATE-NACL 50-0.9 MG/10ML-% IV SOSY
PREFILLED_SYRINGE | INTRAVENOUS | Status: DC | PRN
  Administered 2017-10-20 (×3): 5 mg via INTRAVENOUS
  Administered 2017-10-20: 10 mg via INTRAVENOUS
  Administered 2017-10-20 (×4): 5 mg via INTRAVENOUS

## 2017-10-20 MED ORDER — SODIUM CHLORIDE 0.9 % IJ SOLN
INTRAMUSCULAR | Status: AC
  Filled 2017-10-20: qty 20

## 2017-10-20 MED ORDER — SURGIFOAM 100 EX MISC
CUTANEOUS | Status: DC | PRN
  Administered 2017-10-20: 20 mL via TOPICAL

## 2017-10-20 MED ORDER — MENTHOL 3 MG MT LOZG
1.0000 | LOZENGE | OROMUCOSAL | Status: DC | PRN
  Filled 2017-10-20: qty 9

## 2017-10-20 MED ORDER — POTASSIUM CHLORIDE IN NACL 20-0.9 MEQ/L-% IV SOLN: INTRAVENOUS | Status: DC

## 2017-10-20 MED ORDER — BUPIVACAINE HCL (PF) 0.5 % IJ SOLN
INTRAMUSCULAR | Status: AC
  Filled 2017-10-20: qty 30

## 2017-10-20 MED ORDER — SODIUM CHLORIDE 0.9% FLUSH
3.0000 mL | Freq: Two times a day (BID) | INTRAVENOUS | Status: DC
  Administered 2017-10-20: 3 mL via INTRAVENOUS

## 2017-10-20 MED ORDER — FLUTICASONE PROPIONATE 50 MCG/ACT NA SUSP
2.0000 | Freq: Every day | NASAL | Status: DC
  Administered 2017-10-21: 2 via NASAL
  Filled 2017-10-20: qty 16

## 2017-10-20 MED ORDER — THROMBIN (RECOMBINANT) 5000 UNITS EX SOLR
CUTANEOUS | Status: AC
  Filled 2017-10-20: qty 5000

## 2017-10-20 MED ORDER — HYDROXYZINE HCL 25 MG PO TABS: 50.0000 mg | ORAL_TABLET | ORAL | Status: DC | PRN

## 2017-10-20 MED ORDER — ONDANSETRON HCL 4 MG/2ML IJ SOLN: 4.0000 mg | Freq: Four times a day (QID) | INTRAMUSCULAR | Status: DC | PRN

## 2017-10-20 MED ORDER — LIDOCAINE-EPINEPHRINE 1 %-1:100000 IJ SOLN
INTRAMUSCULAR | Status: DC | PRN
  Administered 2017-10-20: 20 mL

## 2017-10-20 MED ORDER — BISACODYL 10 MG RE SUPP: 10.0000 mg | Freq: Every day | RECTAL | Status: DC | PRN

## 2017-10-20 MED ORDER — FENTANYL CITRATE (PF) 250 MCG/5ML IJ SOLN
INTRAMUSCULAR | Status: AC
  Filled 2017-10-20: qty 5

## 2017-10-20 MED ORDER — KETOROLAC TROMETHAMINE 30 MG/ML IJ SOLN
15.0000 mg | Freq: Once | INTRAMUSCULAR | Status: AC
  Administered 2017-10-20: 15 mg via INTRAVENOUS

## 2017-10-20 MED ORDER — VITAMIN D 1000 UNITS PO TABS
2000.0000 [IU] | ORAL_TABLET | Freq: Two times a day (BID) | ORAL | Status: DC
  Administered 2017-10-21: 2000 [IU] via ORAL
  Filled 2017-10-20: qty 2

## 2017-10-20 MED ORDER — FLEET ENEMA 7-19 GM/118ML RE ENEM: 1.0000 | ENEMA | Freq: Once | RECTAL | Status: DC | PRN

## 2017-10-20 MED ORDER — KETOROLAC TROMETHAMINE 30 MG/ML IJ SOLN
INTRAMUSCULAR | Status: AC
  Filled 2017-10-20: qty 1

## 2017-10-20 MED ORDER — MAGNESIUM HYDROXIDE 400 MG/5ML PO SUSP: 30.0000 mL | Freq: Every day | ORAL | Status: DC | PRN

## 2017-10-20 MED ORDER — SODIUM CHLORIDE 0.9 % IR SOLN
Status: DC | PRN
  Administered 2017-10-20: 500 mL

## 2017-10-20 MED ORDER — HYDROCODONE-ACETAMINOPHEN 5-325 MG PO TABS
1.0000 | ORAL_TABLET | ORAL | Status: DC | PRN
  Administered 2017-10-20 – 2017-10-21 (×5): 1 via ORAL
  Filled 2017-10-20 (×5): qty 1

## 2017-10-20 MED ORDER — SERTRALINE HCL 25 MG PO TABS
25.0000 mg | ORAL_TABLET | Freq: Every day | ORAL | Status: DC
  Administered 2017-10-21: 25 mg via ORAL
  Filled 2017-10-20: qty 1

## 2017-10-20 MED ORDER — LIDOCAINE-EPINEPHRINE 1 %-1:100000 IJ SOLN
INTRAMUSCULAR | Status: AC
  Filled 2017-10-20: qty 1

## 2017-10-20 MED ORDER — FENTANYL CITRATE (PF) 100 MCG/2ML IJ SOLN: 25.0000 ug | INTRAMUSCULAR | Status: DC | PRN

## 2017-10-20 MED ORDER — KETOROLAC TROMETHAMINE 15 MG/ML IJ SOLN
INTRAMUSCULAR | Status: AC
  Filled 2017-10-20: qty 1

## 2017-10-20 MED ORDER — PHENYLEPHRINE 40 MCG/ML (10ML) SYRINGE FOR IV PUSH (FOR BLOOD PRESSURE SUPPORT)
PREFILLED_SYRINGE | INTRAVENOUS | Status: DC | PRN
  Administered 2017-10-20 (×2): 80 ug via INTRAVENOUS
  Administered 2017-10-20: 40 ug via INTRAVENOUS

## 2017-10-20 MED ORDER — LIDOCAINE 2% (20 MG/ML) 5 ML SYRINGE
INTRAMUSCULAR | Status: DC | PRN
  Administered 2017-10-20: 60 mg via INTRAVENOUS

## 2017-10-20 MED ORDER — SODIUM CHLORIDE 0.9 % IV SOLN: 250.0000 mL | INTRAVENOUS | Status: DC

## 2017-10-20 MED ORDER — PROPOFOL 10 MG/ML IV BOLUS
INTRAVENOUS | Status: DC | PRN
  Administered 2017-10-20: 150 mg via INTRAVENOUS

## 2017-10-20 MED ORDER — THROMBIN (RECOMBINANT) 20000 UNITS EX SOLR
CUTANEOUS | Status: AC
  Filled 2017-10-20: qty 20000

## 2017-10-20 MED ORDER — OXYCODONE HCL 5 MG PO TABS: 5.0000 mg | ORAL_TABLET | Freq: Once | ORAL | Status: DC | PRN

## 2017-10-20 MED ORDER — PRAVASTATIN SODIUM 20 MG PO TABS
20.0000 mg | ORAL_TABLET | Freq: Every day | ORAL | Status: DC
  Administered 2017-10-20: 20 mg via ORAL
  Filled 2017-10-20: qty 1

## 2017-10-20 MED ORDER — CYCLOBENZAPRINE HCL 5 MG PO TABS: 5.0000 mg | ORAL_TABLET | Freq: Three times a day (TID) | ORAL | Status: DC | PRN

## 2017-10-20 MED ORDER — DEXTROSE 5 % IV SOLN
INTRAVENOUS | Status: DC | PRN
  Administered 2017-10-20: 10 ug/min via INTRAVENOUS

## 2017-10-20 MED ORDER — ONDANSETRON HCL 4 MG/2ML IJ SOLN
INTRAMUSCULAR | Status: AC
  Filled 2017-10-20: qty 2

## 2017-10-20 MED ORDER — 0.9 % SODIUM CHLORIDE (POUR BTL) OPTIME
TOPICAL | Status: DC | PRN
  Administered 2017-10-20 (×2): 1000 mL

## 2017-10-20 MED ORDER — KETOROLAC TROMETHAMINE 30 MG/ML IJ SOLN
15.0000 mg | Freq: Four times a day (QID) | INTRAMUSCULAR | Status: DC
  Administered 2017-10-20 – 2017-10-21 (×3): 15 mg via INTRAVENOUS
  Filled 2017-10-20 (×3): qty 1

## 2017-10-20 MED ORDER — LACTATED RINGERS IV SOLN
INTRAVENOUS | Status: DC | PRN
  Administered 2017-10-20 (×2): via INTRAVENOUS

## 2017-10-20 MED ORDER — BUPIVACAINE HCL (PF) 0.5 % IJ SOLN
INTRAMUSCULAR | Status: DC | PRN
  Administered 2017-10-20: 20 mL

## 2017-10-20 MED ORDER — HYDROXYZINE HCL 50 MG/ML IM SOLN
50.0000 mg | INTRAMUSCULAR | Status: DC | PRN
  Administered 2017-10-20: 50 mg via INTRAMUSCULAR
  Filled 2017-10-20: qty 1

## 2017-10-20 MED ORDER — DEXAMETHASONE SODIUM PHOSPHATE 10 MG/ML IJ SOLN
INTRAMUSCULAR | Status: AC
  Filled 2017-10-20: qty 1

## 2017-10-20 MED ORDER — ROCURONIUM BROMIDE 10 MG/ML (PF) SYRINGE
PREFILLED_SYRINGE | INTRAVENOUS | Status: AC
  Filled 2017-10-20: qty 5

## 2017-10-20 MED ORDER — FENTANYL CITRATE (PF) 100 MCG/2ML IJ SOLN
INTRAMUSCULAR | Status: DC | PRN
  Administered 2017-10-20: 100 ug via INTRAVENOUS
  Administered 2017-10-20: 50 ug via INTRAVENOUS
  Administered 2017-10-20: 25 ug via INTRAVENOUS
  Administered 2017-10-20 (×2): 50 ug via INTRAVENOUS
  Administered 2017-10-20: 25 ug via INTRAVENOUS

## 2017-10-20 MED ORDER — LISINOPRIL 20 MG PO TABS
20.0000 mg | ORAL_TABLET | Freq: Every day | ORAL | Status: DC
  Administered 2017-10-21: 20 mg via ORAL
  Filled 2017-10-20: qty 1

## 2017-10-20 MED ORDER — LISINOPRIL-HYDROCHLOROTHIAZIDE 20-25 MG PO TABS: 1.0000 | ORAL_TABLET | Freq: Every day | ORAL | Status: DC

## 2017-10-20 MED ORDER — ACETAMINOPHEN 10 MG/ML IV SOLN
INTRAVENOUS | Status: DC | PRN
  Administered 2017-10-20: 1000 mg via INTRAVENOUS

## 2017-10-20 MED ORDER — MONTELUKAST SODIUM 10 MG PO TABS
10.0000 mg | ORAL_TABLET | Freq: Every day | ORAL | Status: DC
  Administered 2017-10-20: 10 mg via ORAL
  Filled 2017-10-20: qty 1

## 2017-10-20 MED ORDER — SUGAMMADEX SODIUM 200 MG/2ML IV SOLN
INTRAVENOUS | Status: DC | PRN
  Administered 2017-10-20: 150 mg via INTRAVENOUS

## 2017-10-20 MED ORDER — ROCURONIUM BROMIDE 10 MG/ML (PF) SYRINGE
PREFILLED_SYRINGE | INTRAVENOUS | Status: DC | PRN
  Administered 2017-10-20: 10 mg via INTRAVENOUS
  Administered 2017-10-20: 20 mg via INTRAVENOUS
  Administered 2017-10-20: 50 mg via INTRAVENOUS
  Administered 2017-10-20: 20 mg via INTRAVENOUS

## 2017-10-20 MED ORDER — MIDAZOLAM HCL 5 MG/5ML IJ SOLN
INTRAMUSCULAR | Status: DC | PRN
  Administered 2017-10-20: 2 mg via INTRAVENOUS

## 2017-10-20 MED ORDER — CEFAZOLIN SODIUM-DEXTROSE 2-4 GM/100ML-% IV SOLN
2.0000 g | INTRAVENOUS | Status: AC
  Administered 2017-10-20 (×2): 2 g via INTRAVENOUS
  Filled 2017-10-20: qty 100

## 2017-10-20 MED ORDER — MIDAZOLAM HCL 2 MG/2ML IJ SOLN
INTRAMUSCULAR | Status: AC
  Filled 2017-10-20: qty 2

## 2017-10-20 MED ORDER — SODIUM CHLORIDE 0.9% FLUSH: 3.0000 mL | INTRAVENOUS | Status: DC | PRN

## 2017-10-20 MED ORDER — ONDANSETRON HCL 4 MG PO TABS: 4.0000 mg | ORAL_TABLET | Freq: Four times a day (QID) | ORAL | Status: DC | PRN

## 2017-10-20 MED ORDER — ACETAMINOPHEN 10 MG/ML IV SOLN
INTRAVENOUS | Status: AC
  Filled 2017-10-20: qty 100

## 2017-10-20 SURGICAL SUPPLY — 80 items
ADH SKN CLS APL DERMABOND .7 (GAUZE/BANDAGES/DRESSINGS) ×2
APL SKNCLS STERI-STRIP NONHPOA (GAUZE/BANDAGES/DRESSINGS) ×1
BAG DECANTER FOR FLEXI CONT (MISCELLANEOUS) ×2 IMPLANT
BENZOIN TINCTURE PRP APPL 2/3 (GAUZE/BANDAGES/DRESSINGS) ×2 IMPLANT
BLADE 10 SAFETY STRL DISP (BLADE) ×1 IMPLANT
BLADE CLIPPER SURG (BLADE) IMPLANT
BUR ACRON 5.0MM COATED (BURR) ×2 IMPLANT
BUR MATCHSTICK NEURO 3.0 LAGG (BURR) ×2 IMPLANT
CANISTER SUCT 3000ML PPV (MISCELLANEOUS) ×2 IMPLANT
CAP LCK SPNE (Orthopedic Implant) ×4 IMPLANT
CAP LOCK SPINE RADIUS (Orthopedic Implant) IMPLANT
CAP LOCKING (Orthopedic Implant) ×8 IMPLANT
CARTRIDGE OIL MAESTRO DRILL (MISCELLANEOUS) ×1 IMPLANT
CONT SPEC 4OZ CLIKSEAL STRL BL (MISCELLANEOUS) ×2 IMPLANT
COVER BACK TABLE 60X90IN (DRAPES) ×2 IMPLANT
DECANTER SPIKE VIAL GLASS SM (MISCELLANEOUS) ×2 IMPLANT
DERMABOND ADVANCED (GAUZE/BANDAGES/DRESSINGS) ×2
DERMABOND ADVANCED .7 DNX12 (GAUZE/BANDAGES/DRESSINGS) ×1 IMPLANT
DIFFUSER DRILL AIR PNEUMATIC (MISCELLANEOUS) ×2 IMPLANT
DRAPE C-ARM 42X72 X-RAY (DRAPES) ×4 IMPLANT
DRAPE C-ARMOR (DRAPES) ×2 IMPLANT
DRAPE HALF SHEET 40X57 (DRAPES) ×2 IMPLANT
DRAPE LAPAROTOMY 100X72X124 (DRAPES) ×2 IMPLANT
DRAPE POUCH INSTRU U-SHP 10X18 (DRAPES) ×2 IMPLANT
ELECT BLADE 4.0 EZ CLEAN MEGAD (MISCELLANEOUS) ×2
ELECT REM PT RETURN 9FT ADLT (ELECTROSURGICAL) ×2
ELECTRODE BLDE 4.0 EZ CLN MEGD (MISCELLANEOUS) IMPLANT
ELECTRODE REM PT RTRN 9FT ADLT (ELECTROSURGICAL) ×1 IMPLANT
GAUZE SPONGE 4X4 12PLY STRL (GAUZE/BANDAGES/DRESSINGS) ×1 IMPLANT
GAUZE SPONGE 4X4 12PLY STRL LF (GAUZE/BANDAGES/DRESSINGS) ×1 IMPLANT
GAUZE SPONGE 4X4 16PLY XRAY LF (GAUZE/BANDAGES/DRESSINGS) ×1 IMPLANT
GLOVE BIO SURGEON STRL SZ7.5 (GLOVE) ×2 IMPLANT
GLOVE BIOGEL PI IND STRL 6.5 (GLOVE) IMPLANT
GLOVE BIOGEL PI IND STRL 7.5 (GLOVE) IMPLANT
GLOVE BIOGEL PI IND STRL 8 (GLOVE) ×2 IMPLANT
GLOVE BIOGEL PI INDICATOR 6.5 (GLOVE) ×5
GLOVE BIOGEL PI INDICATOR 7.5 (GLOVE) ×1
GLOVE BIOGEL PI INDICATOR 8 (GLOVE) ×2
GLOVE ECLIPSE 7.5 STRL STRAW (GLOVE) ×4 IMPLANT
GLOVE SURG SS PI 6.5 STRL IVOR (GLOVE) ×5 IMPLANT
GOWN STRL REUS W/ TWL LRG LVL3 (GOWN DISPOSABLE) IMPLANT
GOWN STRL REUS W/ TWL XL LVL3 (GOWN DISPOSABLE) ×2 IMPLANT
GOWN STRL REUS W/TWL 2XL LVL3 (GOWN DISPOSABLE) IMPLANT
GOWN STRL REUS W/TWL LRG LVL3 (GOWN DISPOSABLE) ×6
GOWN STRL REUS W/TWL XL LVL3 (GOWN DISPOSABLE) ×6
KIT BASIN OR (CUSTOM PROCEDURE TRAY) ×2 IMPLANT
KIT INFUSE X SMALL 1.4CC (Orthopedic Implant) ×1 IMPLANT
KIT ROOM TURNOVER OR (KITS) ×2 IMPLANT
NDL ASP BONE MRW 8GX15 (NEEDLE) IMPLANT
NDL SPNL 18GX3.5 QUINCKE PK (NEEDLE) ×1 IMPLANT
NDL SPNL 22GX3.5 QUINCKE BK (NEEDLE) ×1 IMPLANT
NEEDLE ASP BONE MRW 8GX15 (NEEDLE) ×2 IMPLANT
NEEDLE SPNL 18GX3.5 QUINCKE PK (NEEDLE) ×4 IMPLANT
NEEDLE SPNL 22GX3.5 QUINCKE BK (NEEDLE) ×2 IMPLANT
NS IRRIG 1000ML POUR BTL (IV SOLUTION) ×2 IMPLANT
OIL CARTRIDGE MAESTRO DRILL (MISCELLANEOUS) ×2
PACK LAMINECTOMY NEURO (CUSTOM PROCEDURE TRAY) ×2 IMPLANT
PAD ARMBOARD 7.5X6 YLW CONV (MISCELLANEOUS) ×6 IMPLANT
PATTIES SURGICAL .5 X.5 (GAUZE/BANDAGES/DRESSINGS) IMPLANT
PATTIES SURGICAL .5 X1 (DISPOSABLE) IMPLANT
PATTIES SURGICAL 1X1 (DISPOSABLE) ×1 IMPLANT
ROD 5.5X30MM (Rod) ×1 IMPLANT
ROD RADIUS 35MM (Rod) ×1 IMPLANT
SCREW 5.75X40M (Screw) ×2 IMPLANT
SCREW 5.75X45MM (Screw) ×2 IMPLANT
SPACER SPINAL 8X25X12 4D (Spacer) ×2 IMPLANT
SPONGE LAP 4X18 X RAY DECT (DISPOSABLE) IMPLANT
SPONGE NEURO XRAY DETECT 1X3 (DISPOSABLE) ×1 IMPLANT
SPONGE SURGIFOAM ABS GEL 100 (HEMOSTASIS) ×2 IMPLANT
STRIP BIOACTIVE VITOSS 25X100X (Neuro Prosthesis/Implant) ×2 IMPLANT
SUT VIC AB 1 CT1 18XBRD ANBCTR (SUTURE) ×2 IMPLANT
SUT VIC AB 1 CT1 8-18 (SUTURE) ×4
SUT VIC AB 2-0 CP2 18 (SUTURE) ×4 IMPLANT
SYR 3ML LL SCALE MARK (SYRINGE) IMPLANT
SYR CONTROL 10ML LL (SYRINGE) ×2 IMPLANT
TAPE CLOTH SURG 4X10 WHT LF (GAUZE/BANDAGES/DRESSINGS) ×1 IMPLANT
TOWEL GREEN STERILE (TOWEL DISPOSABLE) ×2 IMPLANT
TOWEL GREEN STERILE FF (TOWEL DISPOSABLE) ×2 IMPLANT
TRAY FOLEY W/METER SILVER 16FR (SET/KITS/TRAYS/PACK) ×2 IMPLANT
WATER STERILE IRR 1000ML POUR (IV SOLUTION) ×2 IMPLANT

## 2017-10-20 NOTE — Progress Notes (Signed)
Vitals:   10/20/17 1332 10/20/17 1401 10/20/17 1627 10/20/17 1656  BP: 140/60 137/66 136/66 133/69  Pulse: 98 96 79 88  Resp: 19 18 18 18   Temp: 97.7 F (36.5 C) 97.7 F (36.5 C) (!) 97.4 F (36.3 C) 97.6 F (36.4 C)  TempSrc:      SpO2: 93% 96% 98% 98%    Patient up and ambulating in the halls. Dressing clean and dry. Foley DC'd, voiding well. Good relief of radicular pain.  Plan: Doing well following surgery today. Encouraged to ambulate. Continue to progress through postoperative recovery.  Hosie Spangle, MD 10/20/2017, 6:28 PM

## 2017-10-20 NOTE — Op Note (Signed)
10/20/2017  12:47 PM  PATIENT:  [place]  72 y.o. 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, grade 2-3 dynamic degenerative L4-5 lumbar spondylolisthesis, lumbar spondylosis, lumbar degenerative disease  PROCEDURE:  Procedure(s):  Bilateral L4-5 decompression including laminectomy, facetectomy, and foraminotomies for decompression of the stenotic compression of the exiting L4, L5, and S1 nerve roots with decompression beyond that required for interbody arthrodesis; bilateral L4-5 posterior lumbar interbody arthrodesis with AVS peek interbody implants, Vitoss BA with bone marrow aspirate, and infuse; bilateral L4-5 posterior lateral arthrodesis with nonsegmental radius posterior instrumentation, locally harvested morcellized autograft, Vitoss BA with bone marrow aspirate, and infuse  SURGEON:  Jovita Gamma, M.D.  ASSISTANTS: Newman Pies, M.D.  ANESTHESIA:   general  EBL:  Total I/O In: 1500 [I.V.:1000; IV Piggyback:500] Out: 585 [Urine:335; Blood:250]  BLOOD ADMINISTERED:none  CELL SAVER GIVEN: Cell Saver technician felt that there was insufficient blood loss or process to collect the blood  COUNT: Correct per nursing staff  DICTATION: Patient is brought to the operating room placed under general endotracheal anesthesia. The patient was turned to prone position the lumbar region was prepped with Betadine soap and solution and draped in a sterile fashion. The midline was infiltrated with local anesthesia with epinephrine. A localizing x-ray was taken and then a midline incision was made carried down through the subcutaneous tissue, bipolar cautery and electrocautery were used to maintain hemostasis. Dissection was  carried down to the lumbar fascia. The fascia was incised bilaterally and the paraspinal muscles were dissected with a spinous process and lamina in a subperiosteal fashion. Additional x-rays were taken for localization and the L4-5 level was localized. Dissection was then carried out laterally over the facet complex and the transverse processes of L4 and L5 were exposed and decorticated.  We then proceeded with the decompression. An L4 laminectomy and superior L5 and a 3 was performed laterally using the high-speed drill, double-action rongeurs, and Kerrison punches. Dissection was carried out laterally including bilateral facetectomy and foraminotomies with decompression of the stenotic compression of the L4, L5, and S1 nerve roots. Once the decompression stenotic compression of the thecal sac and exiting nerve roots was completed we proceeded with the posterior lumbar interbody arthrodesis. The annulus was incised bilaterally and the disc space entered. The spondylolisthesis was noted. A thorough discectomy was performed using pituitary rongeurs and curettes. Once the discectomy was completed we began to prepare the endplate surfaces removing the cartilaginous endplates surface. We then measured the height of the intervertebral disc space. We selected a 12 x 25 x 4 AVS peek interbody implants.  The C-arm fluoroscope was then draped and brought into the field and we identified the pedicle entry points bilaterally at the L4 and L5 levels. Each of the 4 pedicles was probed, we aspirated bone marrow aspirate from the vertebral bodies, this was injected over two 10 cc strips of Vitoss BA. Then each of the pedicles was examined with the ball probe good bony surfaces were found and no bony cuts were found. Each of the pedicles was then tapped with a 5.25 mm tap, again examined with the ball probe good threading was found and no bony cuts were found. We then placed 5.75 x 45 mm screws bilaterally at the L4 level and  5.75 x 40 mm screws bilaterally at the L5 level.  We then packed the AVS peek interbody implants with Vitoss BA with bone marrow aspirate and infuse, and then placed the first implant and on the right side, carefully retracting the thecal sac and nerve root medially. We then went back to the left side and packed the midline with additional Vitoss BA with bone marrow aspirate and infuse, and then placed a second implant and on the left side again retracting the thecal sac and nerve root medially. Additional Vitoss BA with bone marrow aspirate was packed lateral to the implants.  We then packed the lateral gutter over the transverse processes and intertransverse space with locally harvested morcellized autograft, Vitoss BA with bone marrow aspirate, and infuse. We then selected pre-lordosed rods. We used a 35 mm rod in the left than the 30 mm rod on the right.  They were placed within the screw heads and secured with locking caps, once all 4 locking caps were placed final tightening was performed against a counter torque.  The wound had been irrigated multiple times during the procedure with saline solution and bacitracin solution, good hemostasis was established with a combination of bipolar cautery and Gelfoam with thrombin. Once good hemostasis was confirmed we proceeded with closure paraspinal muscles deep fascia and Scarpa's fascia were closed with interrupted undyed 1 Vicryl sutures the subcutaneous and subcuticular closed with interrupted inverted 2-0 undyed Vicryl sutures the skin edges were approximated with Dermabond. The wound was dressed with sterile gauze and Hypafix.  Following surgery the patient was turned back to the supine position to be reversed and the anesthetic extubated and transferred to the recovery room for further care.   PLAN OF CARE: Admit to inpatient   PATIENT DISPOSITION:  PACU - hemodynamically stable.   Delay start of Pharmacological VTE agent (>24hrs) due to surgical  blood loss or risk of bleeding:  yes

## 2017-10-20 NOTE — Anesthesia Preprocedure Evaluation (Signed)
Anesthesia Evaluation  Patient identified by MRN, date of birth, ID band Patient awake    Reviewed: Allergy & Precautions, H&P , NPO status , Patient's Chart, lab work & pertinent test results  History of Anesthesia Complications (+) PONV and history of anesthetic complications  Airway Mallampati: II   Neck ROM: full    Dental   Pulmonary neg pulmonary ROS,    breath sounds clear to auscultation       Cardiovascular hypertension, + Peripheral Vascular Disease   Rhythm:regular Rate:Normal     Neuro/Psych PSYCHIATRIC DISORDERS Anxiety    GI/Hepatic hiatal hernia, GERD  ,  Endo/Other    Renal/GU      Musculoskeletal   Abdominal   Peds  Hematology   Anesthesia Other Findings   Reproductive/Obstetrics                             Anesthesia Physical Anesthesia Plan  ASA: II  Anesthesia Plan: General   Post-op Pain Management:    Induction: Intravenous  PONV Risk Score and Plan: 4 or greater and Ondansetron, Dexamethasone, Midazolam, Treatment may vary due to age or medical condition and Scopolamine patch - Pre-op  Airway Management Planned: Oral ETT  Additional Equipment:   Intra-op Plan:   Post-operative Plan: Extubation in OR  Informed Consent: I have reviewed the patients History and Physical, chart, labs and discussed the procedure including the risks, benefits and alternatives for the proposed anesthesia with the patient or authorized representative who has indicated his/her understanding and acceptance.     Plan Discussed with: CRNA, Anesthesiologist and Surgeon  Anesthesia Plan Comments:         Anesthesia Quick Evaluation

## 2017-10-20 NOTE — Progress Notes (Signed)
Report given to jamie hart rn

## 2017-10-20 NOTE — Anesthesia Procedure Notes (Signed)
Procedure Name: Intubation Date/Time: 10/20/2017 8:32 AM Performed by: Imagene Riches, CRNA Pre-anesthesia Checklist: Patient identified, Emergency Drugs available, Suction available and Patient being monitored Patient Re-evaluated:Patient Re-evaluated prior to induction Oxygen Delivery Method: Circle System Utilized Preoxygenation: Pre-oxygenation with 100% oxygen Induction Type: IV induction Ventilation: Mask ventilation without difficulty Laryngoscope Size: Miller and 2 Grade View: Grade I Tube type: Oral Tube size: 7.5 mm Number of attempts: 1 Airway Equipment and Method: Stylet and Oral airway Placement Confirmation: ETT inserted through vocal cords under direct vision,  positive ETCO2 and breath sounds checked- equal and bilateral Secured at: 23 cm Tube secured with: Tape Dental Injury: Teeth and Oropharynx as per pre-operative assessment

## 2017-10-20 NOTE — Transfer of Care (Signed)
Immediate Anesthesia Transfer of Care Note  Patient: Carla Lewis  Procedure(s) Performed: Lumbar four-Lumbar five decompression, Posterior Lumbar Interbody Fusion, Posterolateral arthrodesis (N/A Back)  Patient Location: PACU  Anesthesia Type:General  Level of Consciousness: drowsy and patient cooperative  Airway & Oxygen Therapy: Patient Spontanous Breathing and Patient connected to nasal cannula oxygen  Post-op Assessment: Report given to RN and Post -op Vital signs reviewed and stable  Post vital signs: Reviewed and stable  Last Vitals:  Vitals:   10/20/17 0711  BP: (!) 146/77  Pulse: 71  Resp: 20  Temp: 36.6 C  SpO2: 99%    Last Pain:  Vitals:   10/20/17 0711  TempSrc: Oral  PainSc:          Complications: No apparent anesthesia complications

## 2017-10-20 NOTE — H&P (Signed)
Subjective: Patient is a 72 y.o. right-handed white female who is admitted for treatment of multifactorial lumbar stenosis with neurogenic claudication. Patient's had low back pain for many decades, but is having increasing neurogenic claudication over the past year, with pain worse into the left lower extremity as compared to the right lower extremity.  X-rays show a grade 2-3 dynamic degenerative spondylolisthesis at L4-5 and her MRI scan shows severe multifactorial stenosis at the L4-5 level. She is admitted now for L4-5 lumbar decompression including laminectomy, facetectomy, and foraminotomy, and stabilization via posterior lumbar interbody arthrodesis with interbody implants and bone graft and posterior lateral arthrodesis with posterior instrumentation and bone graft.   Patient Active Problem List   Diagnosis Date Noted  . Excessive Burping 02/23/2017  . Dysphagia 02/23/2017  . Vitamin D insufficiency 08/11/2016  . Environmental and seasonal allergies 08/11/2016  . Overweight (BMI 25.0-29.9) 08/11/2016  . Carotid stenosis 12/19/2013  . GERD (gastroesophageal reflux disease)   . h/o Palpitations   . Anxiety   . Hypertension   . Hyperlipidemia   . Liver cyst   . LVH (left ventricular hypertrophy)    Past Medical History:  Diagnosis Date  . Ankle bruise    SPONTANEOUS BRUISE ON RIGHT ANKLE  . Anxiety   . Complication of anesthesia   . GERD (gastroesophageal reflux disease)   . History of hiatal hernia   . Hyperlipidemia   . Hypertension   . Kidney cysts   . Liver cyst   . Liver cyst   . Lumbar stenosis   . LVH (left ventricular hypertrophy)    MILD LVH per echo in 2007  . Mitral valve regurgitation   . Obesity   . Palpitations   . PONV (postoperative nausea and vomiting)     Past Surgical History:  Procedure Laterality Date  . BREAST LUMPECTOMY Right 1995   RIGHT BREAST  . CHOLECYSTECTOMY    . ESOPHAGEAL MANOMETRY N/A 06/23/2017   Procedure: ESOPHAGEAL MANOMETRY (EM);   Surgeon: Juanita Craver, MD;  Location: [facility];  Service: Endoscopy;  Laterality: N/A;  . LIPOMA EXCISION    . MENISCUS REPAIR Left   . US ECHOCARDIOGRAPHY  2007   SHOWED EF OF 55-60%    Medications Prior to Admission  Medication Sig Dispense Refill Last Dose  . acetaminophen (TYLENOL) 500 MG tablet Take 500-1,000 mg by mouth every 8 (eight) hours as needed for mild pain or moderate pain.   10/19/2017 at Unknown time  . cetirizine (ZYRTEC) 10 MG tablet Take 10 mg by mouth daily.   10/20/2017 at Unknown time  . Cholecalciferol (VITAMIN D) 2000 units tablet Take 2,000 Units by mouth 2 (two) times daily.   Past Week at Unknown time  . cyclobenzaprine (FLEXERIL) 10 MG tablet Take 1 tablet (10 mg total) 3 (three) times daily as needed by mouth for muscle spasms. 30 tablet 0 Past Month at Unknown time  . docusate sodium (COLACE) 100 MG capsule Take 200 mg by mouth daily.   10/19/2017 at Unknown time  . esomeprazole (NEXIUM) 20 MG capsule Take 20 mg by mouth every morning.    10/19/2017 at Unknown time  . fluticasone (FLONASE) 50 MCG/ACT nasal spray Place 2 sprays into both nostrils daily.    10/19/2017 at Unknown time  . Glucosamine-Chondroitin (MOVE FREE PO) Take 1 capsule by mouth 2 (two) times daily.    Past Week at Unknown time  . lisinopril-hydrochlorothiazide (PRINZIDE,ZESTORETIC) 20-25 MG tablet Take 1 tablet by mouth daily. 90 tablet 3  10/19/2017 at Unknown time  . montelukast (SINGULAIR) 10 MG tablet Take 1 tablet (10 mg total) by mouth at bedtime. 30 tablet 3 10/19/2017 at Unknown time  . pravastatin (PRAVACHOL) 20 MG tablet Take 1 tablet (20 mg total) by mouth at bedtime. 90 tablet 1 10/19/2017 at Unknown time  . sertraline (ZOLOFT) 25 MG tablet Take 1 tablet (25 mg total) by mouth daily. 90 tablet 1 Taking  . Wheat Dextrin (BENEFIBER PO) Take 1 Scoop by mouth 2 (two) times daily. TAKE 2 DAILY   10/19/2017 at Unknown time  . aspirin 81 MG tablet Take 81 mg by mouth daily.     More than a month at  Unknown time   Allergies  Allergen Reactions  . Darvon [Propoxyphene] Anaphylaxis  . Codeine     UNSPECIFIED REACTION   . Sulfa Drugs Cross Reactors     UNSPECIFIED REACTION   . Bystolic [Nebivolol Hcl] Other (See Comments)    No energy and lightheadedness.  . Diovan [Valsartan] Anxiety    Social History   Tobacco Use  . Smoking status: Never Smoker  . Smokeless tobacco: Never Used  Substance Use Topics  . Alcohol use: No    Family History  Problem Relation Age of Onset  . Heart disease Father 11  . Hypertension Father 60  . AAA (abdominal aortic aneurysm) Father   . Dementia Mother   . Heart disease Mother        cardiomyopathy     Review of Systems A comprehensive review of systems was negative.  Objective: Vital signs in last 24 hours: Temp:  [97.8 F (36.6 C)] 97.8 F (36.6 C) (01/02 0711) Pulse Rate:  [71] 71 (01/02 0711) Resp:  [20] 20 (01/02 0711) BP: (146)/(77) 146/77 (01/02 0711) SpO2:  [99 %] 99 % (01/02 0711)  EXAM: Patient well-developed well-nourished white female in no acute distress. Lungs are clear to auscultation , the patient has symmetrical respiratory excursion. Heart has a regular rate and rhythm normal S1 and S2 no murmur.   Abdomen is soft nontender nondistended bowel sounds are present. Extremity examination shows no clubbing cyanosis or edema. Motor examination shows 5 over 5 strength in the lower extremities including the iliopsoas quadriceps dorsiflexor extensor hallicus  longus and plantar flexor bilaterally. Sensation is intact to pinprick in the distal lower extremities. Reflexes are symmetrical bilaterally. No pathologic reflexes are present. Patient has a normal gait and stance.   Data Review:CBC    Component Value Date/Time   WBC 5.7 10/07/2017 0844   RBC 4.75 10/07/2017 0844   HGB 13.8 10/07/2017 0844   HGB 14.2 05/05/2017 0850   HCT 40.1 10/07/2017 0844   HCT 42.9 05/05/2017 0850   PLT 345 10/07/2017 0844   PLT 371  05/05/2017 0850   MCV 84.4 10/07/2017 0844   MCV 86 05/05/2017 0850   MCH 29.1 10/07/2017 0844   MCHC 34.4 10/07/2017 0844   RDW 12.9 10/07/2017 0844   RDW 13.8 05/05/2017 0850   LYMPHSABS 1.7 05/05/2017 0850   MONOABS 0.5 09/21/2011 0910   EOSABS 0.1 05/05/2017 0850   BASOSABS 0.1 05/05/2017 0850                          BMET    Component Value Date/Time   NA 132 (L) 10/07/2017 0844   NA 136 05/05/2017 0850   K 3.9 10/07/2017 0844   CL 98 (L) 10/07/2017 0844   CL 101 04/28/2016  CO2 27 10/07/2017 0844   CO2 28 04/28/2016   GLUCOSE 99 10/07/2017 0844   BUN 12 10/07/2017 0844   BUN 10 05/05/2017 0850   CREATININE 0.73 10/07/2017 0844   CREATININE 0.76 02/05/2016 1201   CALCIUM 9.1 10/07/2017 0844   CALCIUM 8.9 04/28/2016   GFRNONAA >60 10/07/2017 0844   GFRNONAA 70.9 04/28/2016   GFRAA >60 10/07/2017 0844     Assessment/Plan: Patient with severe multifactorial lumbar stenosis with associated neurogenic claudications contributed to by grade 2-3 dynamic degenerative spondylolisthesis at L4-5. Patient's been now for lumbar decompression and stabilization.  I've discussed with the patient the nature of his condition, the nature the surgical procedure, the typical length of surgery, hospital stay, and overall recuperation, the limitations postoperatively, and risks of surgery. I discussed risks including risks of infection, bleeding, possibly need for transfusion, the risk of nerve root dysfunction with pain, weakness, numbness, or paresthesias, the risk of dural tear and CSF leakage and possible need for further surgery, the risk of failure of the arthrodesis and possibly for further surgery, the risk of anesthetic complications including myocardial infarction, stroke, pneumonia, and death. We discussed the need for postoperative immobilization in a lumbar brace. Understanding all this the patient does wish to proceed with surgery and is admitted for such.     Hosie Spangle,  MD 10/20/2017 8:12 AM

## 2017-10-20 NOTE — Anesthesia Postprocedure Evaluation (Signed)
Anesthesia Post Note  Patient: Carla Lewis  Procedure(s) Performed: Lumbar four-Lumbar five decompression, Posterior Lumbar Interbody Fusion, Posterolateral arthrodesis (N/A Back)     Patient location during evaluation: PACU Anesthesia Type: General Level of consciousness: awake and alert Pain management: pain level controlled Vital Signs Assessment: post-procedure vital signs reviewed and stable Respiratory status: spontaneous breathing, nonlabored ventilation, respiratory function stable and patient connected to nasal cannula oxygen Cardiovascular status: blood pressure returned to baseline and stable Postop Assessment: no apparent nausea or vomiting Anesthetic complications: no    Last Vitals:  Vitals:   10/20/17 1332 10/20/17 1401  BP: 140/60 137/66  Pulse: 98 96  Resp: 19 18  Temp: 36.5 C 36.5 C  SpO2: 93% 96%    Last Pain:  Vitals:   10/20/17 1420  TempSrc:   PainSc: [place]

## 2017-10-21 MED ORDER — HYDROCODONE-ACETAMINOPHEN 5-325 MG PO TABS: 1.0000 | ORAL_TABLET | ORAL | 0 refills | Status: DC | PRN

## 2017-10-21 NOTE — Progress Notes (Signed)
Pt doing well. Pt and husband given D/C instructions with Rx, verbal understanding was provided. Pt's incision is clean and dry with no sign of infection. Pt's IV was removed prior to D/C. Pt D/C'd home via wheelchair @ 1400 per MD order. Pt is stable @ D/C and has no other needs at this time. Holli Humbles, RN

## 2017-10-21 NOTE — Discharge Instructions (Signed)

## 2017-10-21 NOTE — Discharge Summary (Signed)
Physician Discharge Summary  Patient ID: Carla Lewis MRN: [id number] DOB/AGE: Feb 15, 1946 72 y.o.  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 Postoperatively she has done well. She is up and ambulating actively. She is voiding well. Her incision is healing nicely. She is being discharged to home with instructions regarding wound care and activities.  Discharge Exam: Blood pressure (!) 118/55, pulse 73, temperature 98.5 F (36.9 C), resp. rate 16, SpO2 96 %.  Disposition:  Home  Discharge Instructions    Discharge wound care:   Complete by:  As directed    Leave the wound open to air. Shower daily with the wound uncovered. Water and soapy water should run over the incision area. Do not wash directly on the incision for 2 weeks. Remove the glue after 2 weeks.   Driving Restrictions   Complete by:  As directed    No driving for 2 weeks. May ride in the car locally now. May begin to drive locally in 2 weeks.   Other Restrictions   Complete by:  As directed    Walk gradually increasing distances out in the fresh air at least twice a day. Walking additional 6 times inside the house, gradually increasing distances, daily. No bending, lifting, or twisting. Perform activities between shoulder and waist height (that is at counter height when standing or table height when sitting).     Allergies as of  10/21/2017      Reactions   Darvon [propoxyphene] Anaphylaxis   Codeine    UNSPECIFIED REACTION    Sulfa Drugs Cross Reactors    UNSPECIFIED REACTION    Bystolic [nebivolol Hcl] Other (See Comments)   No energy and lightheadedness.   Diovan [valsartan] Anxiety      Medication List    TAKE these medications   acetaminophen 500 MG tablet Commonly known as:  TYLENOL Take 500-1,000 mg by mouth every 8 (eight) hours as needed for mild pain or moderate pain.   aspirin 81 MG tablet Take 81 mg by mouth daily.   BENEFIBER PO Take 1 Scoop by mouth 2 (two) times daily. TAKE 2 DAILY   cetirizine 10 MG tablet Commonly known as:  ZYRTEC Take 10 mg by mouth daily.   cyclobenzaprine 10 MG tablet Commonly known as:  FLEXERIL Take 1 tablet (10 mg total) 3 (three) times daily as needed by mouth for muscle spasms.   docusate sodium 100 MG capsule Commonly known as:  COLACE Take 200 mg by mouth daily.   esomeprazole 20 MG capsule Commonly known as:  NEXIUM Take 20 mg by mouth every morning.   fluticasone 50 MCG/ACT nasal spray Commonly known as:  FLONASE Place 2 sprays into both nostrils daily.   HYDROcodone-acetaminophen 5-325 MG tablet Commonly known as:  NORCO/VICODIN Take 1-2 tablets by mouth every 4 (four) hours as needed (pain).   lisinopril-hydrochlorothiazide 20-25 MG tablet Commonly known as:  PRINZIDE,ZESTORETIC Take 1 tablet by mouth daily.   montelukast 10 MG tablet Commonly known as:  SINGULAIR Take 1 tablet (10 mg total) by mouth at bedtime.   MOVE FREE  PO Take 1 capsule by mouth 2 (two) times daily.   pravastatin 20 MG tablet Commonly known as:  PRAVACHOL Take 1 tablet (20 mg total) by mouth at bedtime.   sertraline 25 MG tablet Commonly known as:  ZOLOFT Take 1 tablet (25 mg total) by mouth daily.   Vitamin D 2000 units tablet Take 2,000 Units by mouth 2 (two) times daily.            Discharge Care Instructions  (From admission, onward)         Start     Ordered   10/21/17 0000  Discharge wound care:    Comments:  Leave the wound open to air. Shower daily with the wound uncovered. Water and soapy water should run over the incision area. Do not wash directly on the incision for 2 weeks. Remove the glue after 2 weeks.   10/21/17 1319       Signed: Hosie Spangle, MD 10/21/2017, 1:20 PM

## 2017-10-22 MED FILL — Sodium Chloride IV Soln 0.9%: INTRAVENOUS | Qty: 1000 | Status: AC

## 2017-10-22 MED FILL — Heparin Sodium (Porcine) Inj 1000 Unit/ML: INTRAMUSCULAR | Qty: 30 | Status: AC

## 2017-11-09 DIAGNOSIS — M47816 Spondylosis without myelopathy or radiculopathy, lumbar region: Secondary | ICD-10-CM | POA: Diagnosis not present

## 2017-11-09 DIAGNOSIS — M5136 Other intervertebral disc degeneration, lumbar region: Secondary | ICD-10-CM | POA: Diagnosis not present

## 2017-11-09 DIAGNOSIS — M48062 Spinal stenosis, lumbar region with neurogenic claudication: Secondary | ICD-10-CM | POA: Diagnosis not present

## 2017-11-09 DIAGNOSIS — M4316 Spondylolisthesis, lumbar region: Secondary | ICD-10-CM | POA: Diagnosis not present

## 2017-11-09 DIAGNOSIS — I10 Essential (primary) hypertension: Secondary | ICD-10-CM | POA: Diagnosis not present

## 2017-11-09 DIAGNOSIS — Z6829 Body mass index (BMI) 29.0-29.9, adult: Secondary | ICD-10-CM | POA: Diagnosis not present

## 2017-11-09 DIAGNOSIS — Z981 Arthrodesis status: Secondary | ICD-10-CM | POA: Diagnosis not present

## 2017-11-30 ENCOUNTER — Other Ambulatory Visit: Payer: Self-pay | Admitting: Family Medicine

## 2017-11-30 DIAGNOSIS — J3089 Other allergic rhinitis: Secondary | ICD-10-CM

## 2018-01-18 DIAGNOSIS — R030 Elevated blood-pressure reading, without diagnosis of hypertension: Secondary | ICD-10-CM | POA: Diagnosis not present

## 2018-01-18 DIAGNOSIS — Z6829 Body mass index (BMI) 29.0-29.9, adult: Secondary | ICD-10-CM | POA: Diagnosis not present

## 2018-01-18 DIAGNOSIS — M47816 Spondylosis without myelopathy or radiculopathy, lumbar region: Secondary | ICD-10-CM | POA: Diagnosis not present

## 2018-01-18 DIAGNOSIS — M5136 Other intervertebral disc degeneration, lumbar region: Secondary | ICD-10-CM | POA: Diagnosis not present

## 2018-01-18 DIAGNOSIS — M4316 Spondylolisthesis, lumbar region: Secondary | ICD-10-CM | POA: Diagnosis not present

## 2018-01-18 DIAGNOSIS — Z981 Arthrodesis status: Secondary | ICD-10-CM | POA: Diagnosis not present

## 2018-01-20 ENCOUNTER — Ambulatory Visit: Payer: Self-pay | Admitting: General Surgery

## 2018-01-20 DIAGNOSIS — K449 Diaphragmatic hernia without obstruction or gangrene: Secondary | ICD-10-CM | POA: Diagnosis not present

## 2018-01-20 NOTE — H&P (Signed)
History of Present Illness Ralene Ok MD; 01/20/2018 10:34 AM) The patient is a 72 year old female who presents with a hiatal hernia. Update: Patient has been doing well since her operation, back surgery, on January 2. She's had relief of her sciatica. Patient states that she continue to dysphasia as well as reflux and dyspepsia. Patient had no other changes in her symptoms as previously discussed.   -------------------------------------- Chief Complaint: Patient is a 72 year old female who was recently seen by Dr. Zella Richer for a large hiatal hernia. Patient has had a long history of reflux, hiatal hernia. She states this and were between 10-20 years. She states she has difficulty in dysphagia with things like meats and breads. She has a sensation of been getting caught in her chest with associated pain. Patient underwent manometry which reveals no achalasia. Patient also had a esophagogram and upper GI which reveals a large hiatal hernia with approximately 1/3-1/2 of her stomach within her left chest cavity. Patient is currently on Protonix. This is helping with her reflux. She states that she does not eat late at night which also helps with the reflux. Patient has had a previous laparoscopic cholecystectomy.   Patient is to have back surgery for spinal stenosis by Dr. Sherwood Gambler in January. She states that she would like to have surgery for her hiatal hernia later in March/April.   Allergies (Tanisha A. Owens Shark, [place]; 01/20/2018 10:04 AM) CODEINE  any strong narcotics makes pt unstable and nauseated SULFUR  adverse effect Bystolic *BETA BLOCKERS*  Sulfa Antibiotics  Allergies Reconciled   Medication History (Tanisha A. Owens Shark, [place]; 01/20/2018 10:04 AM) TraMADol HCl (50MG  Tablet, Oral) Active. DiazePAM (5MG  Tablet, Oral) Active. Baclofen (10MG  Tablet, Oral) Active. Montelukast Sodium (10MG  Tablet, Oral) Active. Meloxicam (7.5MG  Tablet, Oral) Active. Benzonatate  (100MG  Capsule, Oral) Active. Apple Cider Vinegar Plus (Oral) Specific strength unknown - Active. Aspirin (81MG  Tablet, Oral) Active. Benefiber (Oral) Active. Cetirizine HCl (10MG  Tablet, Oral) Active. CloNIDine HCl (0.1MG  Tablet, Oral) Active. CoQ10 (100MG  Capsule, Oral) Active. Docusate Sodium (100MG  Capsule, Oral) Active. Move Free (Oral) Specific strength unknown - Active. Lisinopril-Hydrochlorothiazide (20-25MG  Tablet, Oral) Active. Pravastatin Sodium (40MG  Tablet, Oral) Active. Sertraline HCl (25MG  Tablet, Oral) Active. Esomeprazole Magnesium (20MG  Capsule DR, Oral) Active. Fluticasone Propionate (Nasal) (50MCG/ACT Suspension, Nasal) Active. Medications Reconciled    Review of Systems Ralene Ok, MD; 01/20/2018 10:35 AM) General Not Present- Appetite Loss, Chills, Fatigue, Fever, Night Sweats, Weight Gain and Weight Loss. Skin Not Present- Change in Wart/Mole, Dryness, Hives, Jaundice, New Lesions, Non-Healing Wounds, Rash and Ulcer. HEENT Present- Wears glasses/contact lenses. Not Present- Earache, Hearing Loss, Hoarseness, Nose Bleed, Oral Ulcers, Ringing in the Ears, Seasonal Allergies, Sinus Pain, Sore Throat, Visual Disturbances and Yellow Eyes. Breast Not Present- Breast Mass, Breast Pain, Nipple Discharge and Skin Changes. Cardiovascular Not Present- Chest Pain, Difficulty Breathing Lying Down, Leg Cramps, Palpitations, Rapid Heart Rate, Shortness of Breath and Swelling of Extremities. Gastrointestinal Not Present- Abdominal Pain, Bloating, Bloody Stool, Change in Bowel Habits, Chronic diarrhea, Constipation, Difficulty Swallowing, Excessive gas, Gets full quickly at meals, Hemorrhoids, Indigestion, Nausea, Rectal Pain and Vomiting. Musculoskeletal Not Present- Back Pain, Joint Pain, Joint Stiffness, Muscle Pain, Muscle Weakness and Swelling of Extremities. Neurological Not Present- Decreased Memory, Fainting, Headaches, Numbness, Seizures, Tingling, Tremor,  Trouble walking and Weakness. Psychiatric Not Present- Anxiety, Bipolar, Change in Sleep Pattern, Depression, Fearful and Frequent crying. Endocrine Not Present- Cold Intolerance, Excessive Hunger, Hair Changes, Heat Intolerance and New Diabetes. Hematology Not Present- Blood Thinners, Easy Bruising, Excessive bleeding,  Gland problems, HIV and Persistent Infections. All other systems negative  Vitals (Tanisha A. Brown RMA; 01/20/2018 10:04 AM) 01/20/2018 10:04 AM Weight: 194.6 lb Height: 69in Body Surface Area: 2.04 m Body Mass Index: 28.74 kg/m  Temp.: 97.30F  Pulse: 86 (Regular)  BP: 136/74 (Sitting, Left Arm, Standard)       Physical Exam Ralene Ok, MD; 01/20/2018 10:35 AM) The physical exam findings are as follows: Note:Constitutional: No acute distress, conversant, appears stated age  Eyes: Anicteric sclerae, moist conjunctiva, no lid lag  Neck: No thyromegaly, trachea midline, no cervical lymphadenopathy  Lungs: Clear to auscultation biilaterally, normal respiratory effot  Cardiovascular: regular rate & rhythm, no murmurs, no peripheal edema, pedal pulses 2+  GI: Soft, no masses or hepatosplenomegaly, non-tender to palpation  MSK: Normal gait, no clubbing cyanosis, edema  Skin: No rashes, palpation reveals normal skin turgor  Psychiatric: Appropriate judgment and insight, oriented to person, place, and time    Assessment & Plan Ralene Ok MD; 01/20/2018 10:35 AM) HIATAL HERNIA (K44.9) Impression: 72 year old female with a large hiatal hernia. 1. Will proceed to the operating room for a robotic hiatal hernia repair with mesh and Nissen fundoplication  2. Discussed with patient the risks and benefits of the procedure to include but not limited to: Infection, bleeding, damage to structures, possible pneumothorax, possible recurrence. The patient voiced understanding and wishes to proceed.

## 2018-02-02 ENCOUNTER — Other Ambulatory Visit: Payer: Self-pay | Admitting: Family Medicine

## 2018-03-13 ENCOUNTER — Other Ambulatory Visit: Payer: Self-pay | Admitting: Family Medicine

## 2018-03-13 DIAGNOSIS — J0100 Acute maxillary sinusitis, unspecified: Secondary | ICD-10-CM | POA: Diagnosis not present

## 2018-04-04 ENCOUNTER — Telehealth: Payer: Self-pay | Admitting: Family Medicine

## 2018-04-04 ENCOUNTER — Other Ambulatory Visit: Payer: Self-pay

## 2018-04-04 MED ORDER — SERTRALINE HCL 25 MG PO TABS: 25.0000 mg | ORAL_TABLET | Freq: Every day | ORAL | 0 refills | Status: DC

## 2018-04-04 NOTE — Telephone Encounter (Signed)
Refill on sertraline.  Reviewed chart and sent in refill. MPulliam, CMA/RT(R)

## 2018-04-04 NOTE — Telephone Encounter (Signed)
Medication sent to pharmacy. MPulliam, CMA/RT(R)

## 2018-04-04 NOTE — Telephone Encounter (Signed)
Patient is scheduled for a f/u appt on 04/12/18 but is currently out of her sertraline, patient thought she could make it without this med until her upcoming appt but has now gone almost a week and can notice a change. She is hoping for at a minimum a small supply to be called into [facility] in [place] to get her by until she comes in for her appt next week. Please advise and patient would like a callback to f/u on this issue either way.

## 2018-04-12 ENCOUNTER — Ambulatory Visit (INDEPENDENT_AMBULATORY_CARE_PROVIDER_SITE_OTHER): Payer: Medicare Other | Admitting: Family Medicine

## 2018-04-12 ENCOUNTER — Encounter: Payer: Self-pay | Admitting: Family Medicine

## 2018-04-12 VITALS — BP 120/75 | HR 72 | Ht 69.0 in | Wt 184.9 lb

## 2018-04-12 DIAGNOSIS — F419 Anxiety disorder, unspecified: Secondary | ICD-10-CM | POA: Diagnosis not present

## 2018-04-12 DIAGNOSIS — R002 Palpitations: Secondary | ICD-10-CM | POA: Diagnosis not present

## 2018-04-12 DIAGNOSIS — K219 Gastro-esophageal reflux disease without esophagitis: Secondary | ICD-10-CM

## 2018-04-12 DIAGNOSIS — R7301 Impaired fasting glucose: Secondary | ICD-10-CM

## 2018-04-12 DIAGNOSIS — E7849 Other hyperlipidemia: Secondary | ICD-10-CM

## 2018-04-12 DIAGNOSIS — J3089 Other allergic rhinitis: Secondary | ICD-10-CM | POA: Diagnosis not present

## 2018-04-12 DIAGNOSIS — J324 Chronic pansinusitis: Secondary | ICD-10-CM

## 2018-04-12 DIAGNOSIS — I10 Essential (primary) hypertension: Secondary | ICD-10-CM

## 2018-04-12 DIAGNOSIS — E663 Overweight: Secondary | ICD-10-CM

## 2018-04-12 DIAGNOSIS — E559 Vitamin D deficiency, unspecified: Secondary | ICD-10-CM

## 2018-04-12 MED ORDER — SERTRALINE HCL 25 MG PO TABS: 25.0000 mg | ORAL_TABLET | Freq: Every day | ORAL | 3 refills | Status: DC

## 2018-04-12 MED ORDER — MONTELUKAST SODIUM 10 MG PO TABS: 10.0000 mg | ORAL_TABLET | Freq: Every day | ORAL | 3 refills | Status: DC

## 2018-04-12 MED ORDER — LISINOPRIL-HYDROCHLOROTHIAZIDE 20-25 MG PO TABS: 1.0000 | ORAL_TABLET | Freq: Every day | ORAL | 3 refills | Status: DC

## 2018-04-12 NOTE — Progress Notes (Signed)
Impression and Recommendations:    1. Essential hypertension   2. Other hyperlipidemia   3. Anxiety   4. Vitamin D insufficiency   5. Overweight (BMI 25.0-29.9)   6. Gastroesophageal reflux disease, esophagitis presence not specified   7. h/o Palpitations   8. Environmental and seasonal allergies   9. Chronic pansinusitis   10. Elevated fasting blood sugar     1. GERD - Advised patient to continue to follow up with Dr. Rosendo Gros for her hiatal hernia and upcoming surgery August 13th.  - Will continue to monitor.  Continue Nexium as needed.  2. Blood Pressure - Patient asymptomatic at this time.  - Continue medications as prescribed.  No changes made today.  3. Cholesterol - Patient discontinued use of aspirin and pravastatin prior to surgery this past fall (2018).  She is currently untreated.  - Fasting lipid panel drawn today.  - Advised patient not to start or stop medications in the future without consulting here at the clinic.  4. Mood - Patient asymptomatic.  Continue sertraline as prescribed.    - No changes made to treatment or medications today.  5. Chronic Sinusitis - Begin Singulair every night, especially when sinuses flare.  - Advised the patient to begin using AYR or Neilmed sinus rinses BID followed by flonase BID (one spray to each nostril).  Advised that the patient may also incorporate allegra or claritin PRN.  - Encouraged supportive care, resting, and taking time for herself.  Continue to drink adequate amounts of water, and implement an air purifier at home.  6. Preventative Health Maintenance  BMI Counseling Explained to patient what BMI refers to, and what it means medically.    Told patient to think about it as a "medical risk stratification measurement" and how increasing BMI is associated with increasing risk/ or worsening state of various diseases such as hypertension, hyperlipidemia, diabetes, premature OA, depression etc.  American  Heart Association guidelines for healthy diet, basically Mediterranean diet, and exercise guidelines of 30 minutes 5 days per week or more discussed in detail.  Health counseling performed.  All questions answered.  Lifestyle - Advised patient to continue working toward exercising to improve health.    - Patient will begin with 15 minutes of activity daily.  Recommended that the patient eventually strive for at least 150 minutes of moderate cardiovascular activity per week according to guidelines established by the [facility].   - Healthy dietary habits encouraged, including low-carb, and high amounts of lean protein in diet.   - Patient should also consume adequate amounts of water - half of body weight in oz of water per day, with an additional bottle of water per 30 minutes of exercise - especially given her h/o chronic leg cramps.  7. Follow-Up - Return in 4-6 months.  - Patient is fasting today.  Blood drawn today to check full lab work.  - Strongly encouraged patient to return for chronic follow up and complete physical examination for wellness and health maintenance.  - Patient is due in February of 2020 for next carotid duplex study  - Encouraged patient to continue to visit cardiology to remain established there.   Education and routine counseling performed. Handouts provided.  Orders Placed This Encounter  Procedures  . CBC with Differential/Platelet  . Comprehensive metabolic panel  . Hemoglobin A1c  . Lipid panel  . VITAMIN D 25 Hydroxy (Vit-D Deficiency, Fractures)  . TSH  . T4, free    Meds ordered this  encounter  Medications  . lisinopril-hydrochlorothiazide (PRINZIDE,ZESTORETIC) 20-25 MG tablet    Sig: Take 1 tablet by mouth daily.    Dispense:  90 tablet    Refill:  3  . sertraline (ZOLOFT) 25 MG tablet    Sig: Take 1 tablet (25 mg total) by mouth daily.    Dispense:  90 tablet    Refill:  3  . montelukast (SINGULAIR) 10 MG tablet    Sig: Take 1 tablet (10  mg total) by mouth at bedtime.    Dispense:  90 tablet    Refill:  3    The patient was counseled, risk factors were discussed, anticipatory guidance given.  Gross side effects, risk and benefits, and alternatives of medications discussed with patient.  Patient is aware that all medications have potential side effects and we are unable to predict every side effect or drug-drug interaction that may occur.  Expresses verbal understanding and consents to current therapy plan and treatment regimen.  Return for q 4 mo-twice yearly for chronic issue follow-up and once yearly for Medicare wellness.  Please see AVS handed out to patient at the end of our visit for further patient instructions/ counseling done pertaining to today's office visit.    Note: This document was prepared using Dragon voice recognition software and may include unintentional dictation errors.  This document serves as a record of services personally performed by Mellody Dance, DO. It was created on her behalf by Toni Amend, a trained medical scribe. The creation of this record is based on the scribe's personal observations and the provider's statements to them.   I have reviewed the above medical documentation for accuracy and completeness and I concur.  Mellody Dance 04/17/18 10:37 PM    Subjective:    HPI: Carla Lewis is a 72 y.o. female who presents to [place] at [facility] today for follow up for HTN.    Patient has lost 10 lbs since her last visit here.  Sinusitis Patient was [place] at the beach (30 days ago), and told she had sinusitis.  Went to urgent care, received doxycycline.  She felt somewhat better after 10 days of doxycycline, but otherwise had minimal improvement.  Was given diflucan in addition.  Notes "some days my head weighs a ton."  Has bilateral sinus pressure.  Notes that her "froggy" laryngitis sound hasn't cleared up.  Denies fever chills; denies one-sided  face pain.  Patient takes Zyrtec daily, 4000 IUs vitamin D daily, and OTC Flonase in the morning and at night.  Patient denies chest pain or SOB.  Mood She continues to take sertraline to help with her nerves.  Cholesterol Patient quit taking her cholesterol medication and aspirin last fall (sometime in September 2018).  Patient felt she needed to stop the medication prior to surgery.  Notes "this medication was only a preventive thing," and she had some cramping while on cholesterol medication.  Chronic Leg Cramps Notes that she gets terrible cramps at night and drinks vinegar and water when she experiences these.  She's had cramping for years.   Recent Spinal Surgery Went to see Dr. Sherwood Gambler for spinal surgery on January 2nd - "lumbar 4 & 5, 2 rods and 4 screws, and no pain."  Notes she's been stiff since, but hasn't gone to physical therapy.  She was told she could go if she wanted to, but hasn't been.  GERD Patient has established with Dr. Rosendo Gros for follow-up on her hiatal hernia.  She is scheduled for surgery August 13th.  Notes "I'm tired of taking Nexium and all that."  For the reflux, she takes one Nexium every day - no zantac or anything else.  Cardiology Follow-Up Notes she needs to return to follow-up with cardiology.  She was seeing Dr. Martinique for cardiology, but hasn't been recently.  HTN:  -  Her blood pressure has been controlled at home.   - Patient reports good compliance with blood pressure medications  - Denies medication S-E   - Smoking Status noted   - She denies new onset of: chest pain, exercise intolerance, shortness of breath, dizziness, visual changes, headache, lower extremity swelling or claudication.    Last 3 blood pressure readings in our office are as follows: BP Readings from Last 3 Encounters:  04/12/18 120/75  10/21/17 (!) 118/55  10/07/17 (!) 147/76    Pulse Readings from Last 3 Encounters:  04/12/18 72  10/21/17 73  10/07/17 70      Filed Weights   04/12/18 0942  Weight: 184 lb 14.4 oz (83.9 kg)      Patient Care Team    Relationship Specialty Notifications Start End  Mellody Dance, DO PCP - General Family Medicine  08/11/16   Martinique, Peter M, MD Consulting Physician Cardiology  08/11/16   Elsie Saas, MD Consulting Physician Orthopedic Surgery  08/11/16   Juanita Craver, MD Consulting Physician Gastroenterology  08/11/16   Paula Compton, MD Consulting Physician Obstetrics and Gynecology  08/11/16   Specialists, Dermatology  Dermatology  02/28/17    Comment: yrly exams there  Jackolyn Confer, MD Consulting Physician General Surgery  05/18/17      Lab Results  Component Value Date   CREATININE 0.80 04/12/2018   BUN 15 04/12/2018   NA 134 04/12/2018   K 5.1 04/12/2018   CL 99 04/12/2018   CO2 25 04/12/2018    Lab Results  Component Value Date   CHOL 239 (H) 04/12/2018   CHOL 166 05/05/2017   CHOL 167 04/28/2016    Lab Results  Component Value Date   HDL 54 04/12/2018   HDL 53 05/05/2017   HDL 47 04/28/2016    Lab Results  Component Value Date   LDLCALC 165 (H) 04/12/2018   LDLCALC 98 05/05/2017   LDLCALC 108 04/28/2016    Lab Results  Component Value Date   TRIG 99 04/12/2018   TRIG 77 05/05/2017   TRIG 59 04/28/2016    Lab Results  Component Value Date   CHOLHDL 4.4 04/12/2018   CHOLHDL 3.1 05/05/2017   CHOLHDL 2.6 02/05/2016    No results found for: LDLDIRECT ===================================================================   Patient Active Problem List   Diagnosis Date Noted  . Carotid stenosis 12/19/2013    Priority: High  . Anxiety     Priority: High  . Hypertension     Priority: High  . Hyperlipidemia     Priority: High  . GERD (gastroesophageal reflux disease)     Priority: Medium  . Chronic pansinusitis 04/12/2018  . 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 Excessive Burping 02/23/2017  . Dysphagia 02/23/2017  . Vitamin D  insufficiency 08/11/2016  . Environmental and seasonal allergies 08/11/2016  . Overweight (BMI 25.0-29.9) 08/11/2016  . h/o Palpitations   . Liver cyst   . LVH (left ventricular hypertrophy)      Past Medical History:  Diagnosis Date  . Ankle bruise    SPONTANEOUS BRUISE ON RIGHT ANKLE  . Anxiety   . Complication  of anesthesia   . GERD (gastroesophageal reflux disease)   . History of hiatal hernia   . Hyperlipidemia   . Hypertension   . Kidney cysts   . Liver cyst   . Liver cyst   . Lumbar stenosis   . LVH (left ventricular hypertrophy)    MILD LVH per echo in 2007  . Mitral valve regurgitation   . Obesity   . Palpitations   . PONV (postoperative nausea and vomiting)      Past Surgical History:  Procedure Laterality Date  . BREAST LUMPECTOMY Right 1995   RIGHT BREAST  . CHOLECYSTECTOMY    . ESOPHAGEAL MANOMETRY N/A 06/23/2017   Procedure: ESOPHAGEAL MANOMETRY (EM);  Surgeon: Juanita Craver, MD;  Location: [facility];  Service: Endoscopy;  Laterality: N/A;  . LIPOMA EXCISION    . MENISCUS REPAIR Left   . US ECHOCARDIOGRAPHY  2007   SHOWED EF OF 55-60%     Family History  Problem Relation Age of Onset  . Heart disease Father 69  . Hypertension Father 26  . AAA (abdominal aortic aneurysm) Father   . Dementia Mother   . Heart disease Mother        cardiomyopathy     Social History   Substance and Sexual Activity  Drug Use No  ,  Social History   Substance and Sexual Activity  Alcohol Use No  ,  Social History   Tobacco Use  Smoking Status Never Smoker  Smokeless Tobacco Never Used  ,    Current Outpatient Medications on File Prior to Visit  Medication Sig Dispense Refill  . acetaminophen (TYLENOL) 500 MG tablet Take 500-1,000 mg by mouth every 8 (eight) hours as needed for mild pain or moderate pain.    . cetirizine (ZYRTEC) 10 MG tablet Take 10 mg by mouth daily.    . Cholecalciferol (VITAMIN D) 2000 units tablet Take 2,000 Units by mouth 2  (two) times daily.    Marland Kitchen docusate sodium (COLACE) 100 MG capsule Take 200 mg by mouth daily.    Marland Kitchen esomeprazole (NEXIUM) 20 MG capsule Take 20 mg by mouth every morning.     . fluticasone (FLONASE) 50 MCG/ACT nasal spray Place 2 sprays into both nostrils daily. After sinus rinses    . Glucosamine-Chondroitin (MOVE FREE PO) Take 1 capsule by mouth 2 (two) times daily.     . Wheat Dextrin (BENEFIBER PO) Take 1 Scoop by mouth 2 (two) times daily. TAKE 2 DAILY     No current facility-administered medications on file prior to visit.      Allergies  Allergen Reactions  . Darvon [Propoxyphene] Anaphylaxis  . Codeine     UNSPECIFIED REACTION   . Sulfa Drugs Cross Reactors     UNSPECIFIED REACTION   . Bystolic [Nebivolol Hcl] Other (See Comments)    No energy and lightheadedness.  . Diovan [Valsartan] Anxiety     Review of Systems:   General:  Denies fever, chills Optho/Auditory:   Denies visual changes, blurred vision Respiratory:   Denies SOB, cough, wheeze, DIB  Cardiovascular:   Denies chest pain, palpitations, painful respirations Gastrointestinal:   Denies nausea, vomiting, diarrhea.  Endocrine:     Denies new hot or cold intolerance Musculoskeletal:  Denies joint swelling, gait issues, or new unexplained myalgias/ arthralgias Skin:  Denies rash, suspicious lesions  Neurological:    Denies dizziness, unexplained weakness, numbness  Psychiatric/Behavioral:   Denies mood changes  Objective:    Blood pressure 120/75, pulse 72,  height 5\' 9"  (1.753 m), weight 184 lb 14.4 oz (83.9 kg), SpO2 98 %.  Body mass index is 27.3 kg/m.  General: Well Developed, well nourished, and in no acute distress.  HEENT: Normocephalic, atraumatic, pupils equal round reactive to light, neck supple, No carotid bruits, no JVD.  Edematous and erythema more so in the right nares than the left.  Mild erythema in the throat, but otherwise normal.  Mild tenderness to bilateral maxillary sinuses. Skin: Warm and  dry, cap RF less 2 sec Cardiac: Regular rate and rhythm, S1, S2 WNL's, no murmurs rubs or gallops Respiratory: ECTA B/L, Not using accessory muscles, speaking in full sentences. NeuroM-Sk: Ambulates w/o assistance, moves ext * 4 w/o difficulty, sensation grossly intact.  Ext: scant edema b/l lower ext Psych: No HI/SI, judgement and insight good, Euthymic mood. Full Affect.

## 2018-04-12 NOTE — Patient Instructions (Addendum)
Please drink 92 oz of water per day minimum.  If you exercise, drink more! For every half hour of exercise or sweating, please add one additional bottle of water.  Please walk every day to increase blood flow to muscles and joints.   Circulating fresh blood to muscles will help with cramping!  Make sure you are using the Singulair nightly and doing the sinus rinses followed by 1 spray Flonase twice daily.  Also consider getting an air purifier in your bedroom if need be    Please realize, EXERCISE IS MEDICINE!  -  American Heart Association [facility]) guidelines for exercise : If you are in good health, without any medical conditions, you should engage in 150 minutes of moderate intensity aerobic activity per week.  This means you should be huffing and puffing throughout your workout.   Engaging in regular exercise will improve brain function and memory, as well as improve mood, boost immune system and help with weight management.  As well as the other, more well-known effects of exercise such as decreasing blood sugar levels, decreasing blood pressure,  and decreasing bad cholesterol levels/ increasing good cholesterol levels.     -  The AHA strongly endorses consumption of a diet that contains a variety of foods from all the food categories with an emphasis on fruits and vegetables; fat-free and low-fat dairy products; cereal and grain products; legumes and nuts; and fish, poultry, and/or extra lean meats.    Excessive food intake, especially of foods high in saturated and trans fats, sugar, and salt, should be avoided.    Adequate water intake of roughly 1/2 of your weight in pounds, should equal the ounces of water per day you should drink.  So for instance, if you're 200 pounds, that would be 100 ounces of water per day.         Mediterranean Diet  Why follow it? Research shows  Those who follow the Mediterranean diet have a reduced risk of heart disease   The diet is associated with a  reduced incidence of Parkinson's and Alzheimer's diseases  People following the diet may have longer life expectancies and lower rates of chronic diseases   The Dietary Guidelines for Americans recommends the Mediterranean diet as an eating plan to promote health and prevent disease  What Is the Mediterranean Diet?   Healthy eating plan based on typical foods and recipes of Mediterranean-style cooking  The diet is primarily a plant based diet; these foods should make up a majority of meals   Starches - Plant based foods should make up a majority of meals - They are an important sources of vitamins, minerals, energy, antioxidants, and fiber - Choose whole grains, foods high in fiber and minimally processed items  - Typical grain sources include wheat, oats, barley, corn, brown rice, bulgar, farro, millet, polenta, couscous  - Various types of beans include chickpeas, lentils, fava beans, black beans, white beans   Fruits  Veggies - Large quantities of antioxidant rich fruits & veggies; 6 or more servings  - Vegetables can be eaten raw or lightly drizzled with oil and cooked  - Vegetables common to the traditional Mediterranean Diet include: artichokes, arugula, beets, broccoli, brussel sprouts, cabbage, carrots, celery, collard greens, cucumbers, eggplant, kale, leeks, lemons, lettuce, mushrooms, okra, onions, peas, peppers, potatoes, pumpkin, radishes, rutabaga, shallots, spinach, sweet potatoes, turnips, zucchini - Fruits common to the Mediterranean Diet include: apples, apricots, avocados, cherries, clementines, dates, figs, grapefruits, grapes, melons, nectarines, oranges, peaches, pears,  pomegranates, strawberries, tangerines  Fats - Replace butter and margarine with healthy oils, such as olive oil, canola oil, and tahini  - Limit nuts to no more than a handful a day  - Nuts include walnuts, almonds, pecans, pistachios, pine nuts  - Limit or avoid candied, honey roasted or heavily salted  nuts - Olives are central to the Mediterranean diet - can be eaten whole or used in a variety of dishes   Meats Protein - Limiting red meat: no more than a few times a month - When eating red meat: choose lean cuts and keep the portion to the size of deck of cards - Eggs: approx. 0 to 4 times a week  - Fish and lean poultry: at least 2 a week  - Healthy protein sources include, chicken, [place], lean beef, lamb - Increase intake of seafood such as tuna, salmon, trout, mackerel, shrimp, scallops - Avoid or limit high fat processed meats such as sausage and bacon  Dairy - Include moderate amounts of low fat dairy products  - Focus on healthy dairy such as fat free yogurt, skim milk, low or reduced fat cheese - Limit dairy products higher in fat such as whole or 2% milk, cheese, ice cream  Alcohol - Moderate amounts of red wine is ok  - No more than 5 oz daily for women (all ages) and men older than age 49  - No more than 10 oz of wine daily for men younger than 58  Other - Limit sweets and other desserts  - Use herbs and spices instead of salt to flavor foods  - Herbs and spices common to the traditional Mediterranean Diet include: basil, bay leaves, chives, cloves, cumin, fennel, garlic, lavender, marjoram, mint, oregano, parsley, pepper, rosemary, sage, savory, sumac, tarragon, thyme   Its not just a diet, its a lifestyle:   The Mediterranean diet includes lifestyle factors typical of those in the region   Foods, drinks and meals are best eaten with others and savored  Daily physical activity is important for overall good health  This could be strenuous exercise like running and aerobics  This could also be more leisurely activities such as walking, housework, yard-work, or taking the stairs  Moderation is the key; a balanced and healthy diet accommodates most foods and drinks  Consider portion sizes and frequency of consumption of certain foods   Meal Ideas & Options:    Breakfast:  o Whole wheat toast or whole wheat English muffins with peanut butter & hard boiled egg o Steel cut oats topped with apples & cinnamon and skim milk  o Fresh fruit: banana, strawberries, melon, berries, peaches  o Smoothies: strawberries, bananas, greek yogurt, peanut butter o Low fat greek yogurt with blueberries and granola  o Egg white omelet with spinach and mushrooms o Breakfast couscous: whole wheat couscous, apricots, skim milk, cranberries   Sandwiches:  o Hummus and grilled vegetables (peppers, zucchini, squash) on whole wheat bread   o Grilled chicken on whole wheat pita with lettuce, tomatoes, cucumbers or tzatziki  o Tuna salad on whole wheat bread: tuna salad made with greek yogurt, olives, red peppers, capers, green onions o Garlic rosemary lamb pita: lamb sauted with garlic, rosemary, salt & pepper; add lettuce, cucumber, greek yogurt to pita - flavor with lemon juice and black pepper   Seafood:  o Mediterranean grilled salmon, seasoned with garlic, basil, parsley, lemon juice and black pepper o Shrimp, lemon, and spinach whole-grain pasta salad made with  low fat greek yogurt  o Seared scallops with lemon orzo  o Seared tuna steaks seasoned salt, pepper, coriander topped with tomato mixture of olives, tomatoes, olive oil, minced garlic, parsley, green onions and cappers   Meats:  o Herbed greek chicken salad with kalamata olives, cucumber, feta  o Red bell peppers stuffed with spinach, bulgur, lean ground beef (or lentils) & topped with feta   o Kebabs: skewers of chicken, tomatoes, onions, zucchini, squash  o [place] burgers: made with red onions, mint, dill, lemon juice, feta cheese topped with roasted red peppers  Vegetarian o Cucumber salad: cucumbers, artichoke hearts, celery, red onion, feta cheese, tossed in olive oil & lemon juice  o Hummus and whole grain pita points with a greek salad (lettuce, tomato, feta, olives, cucumbers, red onion) o Lentil  soup with celery, carrots made with vegetable broth, garlic, salt and pepper  o Tabouli salad: parsley, bulgur, mint, scallions, cucumbers, tomato, radishes, lemon juice, olive oil, salt and pepper.

## 2018-04-13 LAB — COMPREHENSIVE METABOLIC PANEL
ALBUMIN: 4.3 g/dL (ref 3.5–4.8)
ALK PHOS: 103 IU/L (ref 39–117)
ALT: 10 IU/L (ref 0–32)
AST: 20 IU/L (ref 0–40)
Albumin/Globulin Ratio: 1.5 (ref 1.2–2.2)
BUN / CREAT RATIO: 19 (ref 12–28)
BUN: 15 mg/dL (ref 8–27)
Bilirubin Total: 0.5 mg/dL (ref 0.0–1.2)
CO2: 25 mmol/L (ref 20–29)
CREATININE: 0.8 mg/dL (ref 0.57–1.00)
Calcium: 9.7 mg/dL (ref 8.7–10.3)
Chloride: 99 mmol/L (ref 96–106)
GFR, EST AFRICAN AMERICAN: 86 mL/min/{1.73_m2} (ref >59)
GFR, EST NON AFRICAN AMERICAN: 74 mL/min/{1.73_m2} (ref >59)
GLOBULIN, TOTAL: 2.9 g/dL (ref 1.5–4.5)
Glucose: 94 mg/dL (ref 65–99)
Potassium: 5.1 mmol/L (ref 3.5–5.2)
SODIUM: 134 mmol/L (ref 134–144)
TOTAL PROTEIN: 7.2 g/dL (ref 6.0–8.5)

## 2018-04-13 LAB — CBC WITH DIFFERENTIAL/PLATELET
BASOS: 2 %
Basophils Absolute: 0.1 10*3/uL (ref 0.0–0.2)
EOS (ABSOLUTE): 0 10*3/uL (ref 0.0–0.4)
Eos: 1 %
Hematocrit: 40.9 % (ref 34.0–46.6)
Hemoglobin: 13.6 g/dL (ref 11.1–15.9)
IMMATURE GRANS (ABS): 0 10*3/uL (ref 0.0–0.1)
IMMATURE GRANULOCYTES: 0 %
LYMPHS: 40 %
Lymphocytes Absolute: 2.2 10*3/uL (ref 0.7–3.1)
MCH: 26.9 pg (ref 26.6–33.0)
MCHC: 33.3 g/dL (ref 31.5–35.7)
MCV: 81 fL (ref 79–97)
MONOCYTES: 10 %
Monocytes Absolute: 0.5 10*3/uL (ref 0.1–0.9)
Neutrophils Absolute: 2.6 10*3/uL (ref 1.4–7.0)
Neutrophils: 47 %
PLATELETS: 404 10*3/uL (ref 150–450)
RBC: 5.06 x10E6/uL (ref 3.77–5.28)
RDW: 15.5 % — ABNORMAL HIGH (ref 12.3–15.4)
WBC: 5.5 10*3/uL (ref 3.4–10.8)

## 2018-04-13 LAB — VITAMIN D 25 HYDROXY (VIT D DEFICIENCY, FRACTURES): VIT D 25 HYDROXY: 49.6 ng/mL (ref 30.0–100.0)

## 2018-04-13 LAB — LIPID PANEL
Chol/HDL Ratio: 4.4 ratio (ref 0.0–4.4)
Cholesterol, Total: 239 mg/dL — ABNORMAL HIGH (ref 100–199)
HDL: 54 mg/dL (ref >39)
LDL CALC: 165 mg/dL — AB (ref 0–99)
TRIGLYCERIDES: 99 mg/dL (ref 0–149)
VLDL CHOLESTEROL CAL: 20 mg/dL (ref 5–40)

## 2018-04-13 LAB — TSH: TSH: 2.25 u[IU]/mL (ref 0.450–4.500)

## 2018-04-13 LAB — T4, FREE: Free T4: 1.21 ng/dL (ref 0.82–1.77)

## 2018-04-13 LAB — HEMOGLOBIN A1C
Est. average glucose Bld gHb Est-mCnc: 120 mg/dL
HEMOGLOBIN A1C: 5.8 % — AB (ref 4.8–5.6)

## 2018-04-19 ENCOUNTER — Telehealth: Payer: Self-pay | Admitting: Family Medicine

## 2018-04-19 NOTE — Telephone Encounter (Signed)
error 

## 2018-05-09 ENCOUNTER — Encounter: Payer: Medicare Other | Admitting: Family Medicine

## 2018-05-18 DIAGNOSIS — Z981 Arthrodesis status: Secondary | ICD-10-CM | POA: Diagnosis not present

## 2018-05-18 DIAGNOSIS — S32009K Unspecified fracture of unspecified lumbar vertebra, subsequent encounter for fracture with nonunion: Secondary | ICD-10-CM | POA: Diagnosis not present

## 2018-05-19 ENCOUNTER — Ambulatory Visit (INDEPENDENT_AMBULATORY_CARE_PROVIDER_SITE_OTHER): Payer: Medicare Other | Admitting: Family Medicine

## 2018-05-19 ENCOUNTER — Encounter: Payer: Self-pay | Admitting: Family Medicine

## 2018-05-19 VITALS — BP 129/71 | HR 72 | Ht 69.0 in | Wt 192.4 lb

## 2018-05-19 DIAGNOSIS — Z0000 Encounter for general adult medical examination without abnormal findings: Secondary | ICD-10-CM

## 2018-05-19 DIAGNOSIS — E2839 Other primary ovarian failure: Secondary | ICD-10-CM | POA: Diagnosis not present

## 2018-05-19 DIAGNOSIS — E7849 Other hyperlipidemia: Secondary | ICD-10-CM | POA: Diagnosis not present

## 2018-05-19 DIAGNOSIS — Z79899 Other long term (current) drug therapy: Secondary | ICD-10-CM | POA: Diagnosis not present

## 2018-05-19 DIAGNOSIS — E559 Vitamin D deficiency, unspecified: Secondary | ICD-10-CM

## 2018-05-19 MED ORDER — VITAMIN D (ERGOCALCIFEROL) 1.25 MG (50000 UNIT) PO CAPS: 50000.0000 [IU] | ORAL_CAPSULE | ORAL | 10 refills | Status: DC

## 2018-05-19 NOTE — Patient Instructions (Addendum)
Let us plan to recheck your fasting lipid profile 6 months after you restarted the last statin medication so we can monitor our treatment.  Also per our discussion please make sure you are eating lots of calcium rich foods if you are unable to tolerate calcium supplements along with the vitamin D that we sent into your pharmacy.   Preventive Care for Adults, Female  A healthy lifestyle and preventive care can promote health and wellness. Preventive health guidelines for women include the following key practices.   A routine yearly physical is a good way to check with your health care provider about your health and preventive screening. It is a chance to share any concerns and updates on your health and to receive a thorough exam.   Visit your dentist for a routine exam and preventive care every 6 months. Brush your teeth twice a day and floss once a day. Good oral hygiene prevents tooth decay and gum disease.   The frequency of eye exams is based on your age, health, family medical history, use of contact lenses, and other factors. Follow your health care provider's recommendations for frequency of eye exams.   Eat a healthy diet. Foods like vegetables, fruits, whole grains, low-fat dairy products, and lean protein foods contain the nutrients you need without too many calories. Decrease your intake of foods high in solid fats, added sugars, and salt. Eat the right amount of calories for you.Get information about a proper diet from your health care provider, if necessary.   Regular physical exercise is one of the most important things you can do for your health. Most adults should get at least 150 minutes of moderate-intensity exercise (any activity that increases your heart rate and causes you to sweat) each week. In addition, most adults need muscle-strengthening exercises on 2 or more days a week.   Maintain a healthy weight. The body mass index (BMI) is a screening tool to identify  possible weight problems. It provides an estimate of body fat based on height and weight. Your health care provider can find your BMI, and can help you achieve or maintain a healthy weight.For adults 20 years and older:   - A BMI below 18.5 is considered underweight.   - A BMI of 18.5 to 24.9 is normal.   - A BMI of 25 to 29.9 is considered overweight.   - A BMI of 30 and above is considered obese.   Maintain normal blood lipids and cholesterol levels by exercising and minimizing your intake of trans and saturated fats.  Eat a balanced diet with plenty of fruit and vegetables. Blood tests for lipids and cholesterol should begin at age 4 and be repeated every 5 years minimum.  If your lipid or cholesterol levels are high, you are over 40, or you are at high risk for heart disease, you may need your cholesterol levels checked more frequently.Ongoing high lipid and cholesterol levels should be treated with medicines if diet and exercise are not working.   If you smoke, find out from your health care provider how to quit. If you do not use tobacco, do not start.   Lung cancer screening is recommended for adults aged 69-80 years who are at high risk for developing lung cancer because of a history of smoking. A yearly low-dose CT scan of the lungs is recommended for people who have at least a 30-pack-year history of smoking and are a current smoker or have quit within the past 15 years.  A pack year of smoking is smoking an average of 1 pack of cigarettes a day for 1 year (for example: 1 pack a day for 30 years or 2 packs a day for 15 years). Yearly screening should continue until the smoker has stopped smoking for at least 15 years. Yearly screening should be stopped for people who develop a health problem that would prevent them from having lung cancer treatment.   If you are pregnant, do not drink alcohol. If you are breastfeeding, be very cautious about drinking alcohol. If you are not pregnant and  choose to drink alcohol, do not have more than 1 drink per day. One drink is considered to be 12 ounces (355 mL) of beer, 5 ounces (148 mL) of wine, or 1.5 ounces (44 mL) of liquor.   Avoid use of street drugs. Do not share needles with anyone. Ask for help if you need support or instructions about stopping the use of drugs.   High blood pressure causes heart disease and increases the risk of stroke. Your blood pressure should be checked at least yearly.  Ongoing high blood pressure should be treated with medicines if weight loss and exercise do not work.   If you are 64-79 years old, ask your health care provider if you should take aspirin to prevent strokes.   Diabetes screening involves taking a blood sample to check your fasting blood sugar level. This should be done once every 3 years, after age 22, if you are within normal weight and without risk factors for diabetes. Testing should be considered at a younger age or be carried out more frequently if you are overweight and have at least 1 risk factor for diabetes.   Breast cancer screening is essential preventive care for women. You should practice "breast self-awareness."  This means understanding the normal appearance and feel of your breasts and may include breast self-examination.  Any changes detected, no matter how small, should be reported to a health care provider.  Women in their 41s and 30s should have a clinical breast exam (CBE) by a health care provider as part of a regular health exam every 1 to 3 years.  After age 24, women should have a CBE every year.  Starting at age 52, women should consider having a mammogram (breast X-ray test) every year.  Women who have a family history of breast cancer should talk to their health care provider about genetic screening.  Women at a high risk of breast cancer should talk to their health care providers about having an MRI and a mammogram every year.   -Breast cancer gene (BRCA)-related  cancer risk assessment is recommended for women who have family members with BRCA-related cancers. BRCA-related cancers include breast, ovarian, tubal, and peritoneal cancers. Having family members with these cancers may be associated with an increased risk for harmful changes (mutations) in the breast cancer genes BRCA1 and BRCA2. Results of the assessment will determine the need for genetic counseling and BRCA1 and BRCA2 testing.   The Pap test is a screening test for cervical cancer. A Pap test can show cell changes on the cervix that might become cervical cancer if left untreated. A Pap test is a procedure in which cells are obtained and examined from the lower end of the uterus (cervix).   - Women should have a Pap test starting at age 46.   - Between ages 30 and 57, Pap tests should be repeated every 2 years.   - Beginning  at age 40, you should have a Pap test every 3 years as long as the past 3 Pap tests have been normal.   - Some women have medical problems that increase the chance of getting cervical cancer. Talk to your health care provider about these problems. It is especially important to talk to your health care provider if a new problem develops soon after your last Pap test. In these cases, your health care provider may recommend more frequent screening and Pap tests.   - The above recommendations are the same for women who have or have not gotten the vaccine for human papillomavirus (HPV).   - If you had a hysterectomy for a problem that was not cancer or a condition that could lead to cancer, then you no longer need Pap tests. Even if you no longer need a Pap test, a regular exam is a good idea to make sure no other problems are starting.   - If you are between ages 14 and 66 years, and you have had normal Pap tests going back 10 years, you no longer need Pap tests. Even if you no longer need a Pap test, a regular exam is a good idea to make sure no other problems are  starting.   - If you have had past treatment for cervical cancer or a condition that could lead to cancer, you need Pap tests and screening for cancer for at least 20 years after your treatment.   - If Pap tests have been discontinued, risk factors (such as a new sexual partner) need to be reassessed to determine if screening should be resumed.   - The HPV test is an additional test that may be used for cervical cancer screening. The HPV test looks for the virus that can cause the cell changes on the cervix. The cells collected during the Pap test can be tested for HPV. The HPV test could be used to screen women aged 43 years and older, and should be used in women of any age who have unclear Pap test results. After the age of 53, women should have HPV testing at the same frequency as a Pap test.   Colorectal cancer can be detected and often prevented. Most routine colorectal cancer screening begins at the age of 32 years and continues through age 53 years. However, your health care provider may recommend screening at an earlier age if you have risk factors for colon cancer. On a yearly basis, your health care provider may provide home test kits to check for hidden blood in the stool.  Use of a small camera at the end of a tube, to directly examine the colon (sigmoidoscopy or colonoscopy), can detect the earliest forms of colorectal cancer. Talk to your health care provider about this at age 78, when routine screening begins. Direct exam of the colon should be repeated every 5 -10 years through age 54 years, unless early forms of pre-cancerous polyps or small growths are found.   People who are at an increased risk for hepatitis B should be screened for this virus. You are considered at high risk for hepatitis B if:  -You were born in a country where hepatitis B occurs often. Talk with your health care provider about which countries are considered high risk.  - Your parents were born in a high-risk  country and you have not received a shot to protect against hepatitis B (hepatitis B vaccine).  - You have HIV or AIDS.  -  You use needles to inject street drugs.  - You live with, or have sex with, someone who has Hepatitis B.  - You get hemodialysis treatment.  - You take certain medicines for conditions like cancer, organ transplantation, and autoimmune conditions.   Hepatitis C blood testing is recommended for all people born from [birth year] through 1965 and any individual with known risks for hepatitis C.   Practice safe sex. Use condoms and avoid high-risk sexual practices to reduce the spread of sexually transmitted infections (STIs). STIs include gonorrhea, chlamydia, syphilis, trichomonas, herpes, HPV, and human immunodeficiency virus (HIV). Herpes, HIV, and HPV are viral illnesses that have no cure. They can result in disability, cancer, and death. Sexually active women aged 50 years and younger should be checked for chlamydia. Older women with new or multiple partners should also be tested for chlamydia. Testing for other STIs is recommended if you are sexually active and at increased risk.   Osteoporosis is a disease in which the bones lose minerals and strength with aging. This can result in serious bone fractures or breaks. The risk of osteoporosis can be identified using a bone density scan. Women ages 16 years and over and women at risk for fractures or osteoporosis should discuss screening with their health care providers. Ask your health care provider whether you should take a calcium supplement or vitamin D to There are also several preventive steps women can take to avoid osteoporosis and resulting fractures or to keep osteoporosis from worsening. -->Recommendations include:  Eat a balanced diet high in fruits, vegetables, calcium, and vitamins.  Get enough calcium. The recommended total intake of is 1,200 mg daily; for best absorption, if taking supplements, divide doses into  250-500 mg doses throughout the day. Of the two types of calcium, calcium carbonate is best absorbed when taken with food but calcium citrate can be taken on an empty stomach.  Get enough vitamin D. [employer] and the [employer] recommend at least 1,000 IU per day for women age 34 and over who are at risk of vitamin D deficiency. Vitamin D deficiency can be caused by inadequate sun exposure (for example, those who live in [place]).  Avoid alcohol and smoking. Heavy alcohol intake (more than 7 drinks per week) increases the risk of falls and hip fracture and women smokers tend to lose bone more rapidly and have lower bone mass than nonsmokers. Stopping smoking is one of the most important changes women can make to improve their health and decrease risk for disease.  Be physically active every day. Weight-bearing exercise (for example, fast walking, hiking, jogging, and weight training) may strengthen bones or slow the rate of bone loss that comes with aging. Balancing and muscle-strengthening exercises can reduce the risk of falling and fracture.  Consider therapeutic medications. Currently, several types of effective drugs are available. Healthcare providers can recommend the type most appropriate for each woman.  Eliminate environmental factors that may contribute to accidents. Falls cause nearly 90% of all osteoporotic fractures, so reducing this risk is an important bone-health strategy. Measures include ample lighting, removing obstructions to walking, using nonskid rugs on floors, and placing mats and/or grab bars in showers.  Be aware of medication side effects. Some common medicines make bones weaker. These include a type of steroid drug called glucocorticoids used for arthritis and asthma, some antiseizure drugs, certain sleeping pills, treatments for endometriosis, and some cancer drugs. An overactive thyroid gland or using too much thyroid hormone for an underactive  thyroid can also be a problem. If you are taking these medicines, talk to your doctor about what you can do to help protect your bones.reduce the rate of osteoporosis.    Menopause can be associated with physical symptoms and risks. Hormone replacement therapy is available to decrease symptoms and risks. You should talk to your health care provider about whether hormone replacement therapy is right for you.   Use sunscreen. Apply sunscreen liberally and repeatedly throughout the day. You should seek shade when your shadow is shorter than you. Protect yourself by wearing long sleeves, pants, a wide-brimmed hat, and sunglasses year round, whenever you are outdoors.   Once a month, do a whole body skin exam, using a mirror to look at the skin on your back. Tell your health care provider of new moles, moles that have irregular borders, moles that are larger than a pencil eraser, or moles that have changed in shape or color.   -Stay current with required vaccines (immunizations).   Influenza vaccine. All adults should be immunized every year.  Tetanus, diphtheria, and acellular pertussis (Td, Tdap) vaccine. Pregnant women should receive 1 dose of Tdap vaccine during each pregnancy. The dose should be obtained regardless of the length of time since the last dose. Immunization is preferred during the 27th 36th week of gestation. An adult who has not previously received Tdap or who does not know her vaccine status should receive 1 dose of Tdap. This initial dose should be followed by tetanus and diphtheria toxoids (Td) booster doses every 10 years. Adults with an unknown or incomplete history of completing a 3-dose immunization series with Td-containing vaccines should begin or complete a primary immunization series including a Tdap dose. Adults should receive a Td booster every 10 years.  Varicella vaccine. An adult without evidence of immunity to varicella should receive 2 doses or a second dose if she  has previously received 1 dose. Pregnant females who do not have evidence of immunity should receive the first dose after pregnancy. This first dose should be obtained before leaving the health care facility. The second dose should be obtained 4 8 weeks after the first dose.  Human papillomavirus (HPV) vaccine. Females aged 50 26 years who have not received the vaccine previously should obtain the 3-dose series. The vaccine is not recommended for use in pregnant females. However, pregnancy testing is not needed before receiving a dose. If a female is found to be pregnant after receiving a dose, no treatment is needed. In that case, the remaining doses should be delayed until after the pregnancy. Immunization is recommended for any person with an immunocompromised condition through the age of 61 years if she did not get any or all doses earlier. During the 3-dose series, the second dose should be obtained 4 8 weeks after the first dose. The third dose should be obtained 24 weeks after the first dose and 16 weeks after the second dose.  Zoster vaccine. One dose is recommended for adults aged 4 years or older unless certain conditions are present.  Measles, mumps, and rubella (MMR) vaccine. Adults born before 40 generally are considered immune to measles and mumps. Adults born in [birth year] or later should have 1 or more doses of MMR vaccine unless there is a contraindication to the vaccine or there is laboratory evidence of immunity to each of the three diseases. A routine second dose of MMR vaccine should be obtained at least 28 days after the first dose for students attending postsecondary  schools, health care workers, or international travelers. People who received inactivated measles vaccine or an unknown type of measles vaccine during 1963 1967 should receive 2 doses of MMR vaccine. People who received inactivated mumps vaccine or an unknown type of mumps vaccine before 1979 and are at high risk for mumps  infection should consider immunization with 2 doses of MMR vaccine. For females of childbearing age, rubella immunity should be determined. If there is no evidence of immunity, females who are not pregnant should be vaccinated. If there is no evidence of immunity, females who are pregnant should delay immunization until after pregnancy. Unvaccinated health care workers born before [birth year] who lack laboratory evidence of measles, mumps, or rubella immunity or laboratory confirmation of disease should consider measles and mumps immunization with 2 doses of MMR vaccine or rubella immunization with 1 dose of MMR vaccine.  Pneumococcal 13-valent conjugate (PCV13) vaccine. When indicated, a person who is uncertain of her immunization history and has no record of immunization should receive the PCV13 vaccine. An adult aged 9 years or older who has certain medical conditions and has not been previously immunized should receive 1 dose of PCV13 vaccine. This PCV13 should be followed with a dose of pneumococcal polysaccharide (PPSV23) vaccine. The PPSV23 vaccine dose should be obtained at least 8 weeks after the dose of PCV13 vaccine. An adult aged 58 years or older who has certain medical conditions and previously received 1 or more doses of PPSV23 vaccine should receive 1 dose of PCV13. The PCV13 vaccine dose should be obtained 1 or more years after the last PPSV23 vaccine dose.  Pneumococcal polysaccharide (PPSV23) vaccine. When PCV13 is also indicated, PCV13 should be obtained first. All adults aged 64 years and older should be immunized. An adult younger than age 55 years who has certain medical conditions should be immunized. Any person who resides in a nursing home or long-term care facility should be immunized. An adult smoker should be immunized. People with an immunocompromised condition and certain other conditions should receive both PCV13 and PPSV23 vaccines. People with human immunodeficiency virus (HIV)  infection should be immunized as soon as possible after diagnosis. Immunization during chemotherapy or radiation therapy should be avoided. Routine use of PPSV23 vaccine is not recommended for American Indians, [place] Natives, or people younger than 65 years unless there are medical conditions that require PPSV23 vaccine. When indicated, people who have unknown immunization and have no record of immunization should receive PPSV23 vaccine. One-time revaccination 5 years after the first dose of PPSV23 is recommended for people aged 12 64 years who have chronic kidney failure, nephrotic syndrome, asplenia, or immunocompromised conditions. People who received 1 2 doses of PPSV23 before age 72 years should receive another dose of PPSV23 vaccine at age 65 years or later if at least 5 years have passed since the previous dose. Doses of PPSV23 are not needed for people immunized with PPSV23 at or after age 7 years.  Meningococcal vaccine. Adults with asplenia or persistent complement component deficiencies should receive 2 doses of quadrivalent meningococcal conjugate (MenACWY-D) vaccine. The doses should be obtained at least 2 months apart. Microbiologists working with certain meningococcal bacteria, [place] recruits, people at risk during an outbreak, and people who travel to or live in countries with a high rate of meningitis should be immunized. A first-year college student up through age 21 years who is living in a residence hall should receive a dose if she did not receive a dose on or after her  16th birthday. Adults who have certain high-risk conditions should receive one or more doses of vaccine.  Hepatitis A vaccine. Adults who wish to be protected from this disease, have certain high-risk conditions, work with hepatitis A-infected animals, work in hepatitis A research labs, or travel to or work in countries with a high rate of hepatitis A should be immunized. Adults who were previously unvaccinated and who  anticipate close contact with an international adoptee during the first 60 days after arrival in the [place] from a country with a high rate of hepatitis A should be immunized.  Hepatitis B vaccine.  Adults who wish to be protected from this disease, have certain high-risk conditions, may be exposed to blood or other infectious body fluids, are household contacts or sex partners of hepatitis B positive people, are clients or workers in certain care facilities, or travel to or work in countries with a high rate of hepatitis B should be immunized.  Haemophilus influenzae type b (Hib) vaccine. A previously unvaccinated person with asplenia or sickle cell disease or having a scheduled splenectomy should receive 1 dose of Hib vaccine. Regardless of previous immunization, a recipient of a hematopoietic stem cell transplant should receive a 3-dose series 6 12 months after her successful transplant. Hib vaccine is not recommended for adults with HIV infection.  Preventive Services / Frequency Ages 27 to 39years  Blood pressure check.** / Every 1 to 2 years.  Lipid and cholesterol check.** / Every 5 years beginning at age 28.  Clinical breast exam.** / Every 3 years for women in their 54s and 65s.  BRCA-related cancer risk assessment.** / For women who have family members with a BRCA-related cancer (breast, ovarian, tubal, or peritoneal cancers).  Pap test.** / Every 2 years from ages 5 through 40. Every 3 years starting at age 68 through age 70 or 104 with a history of 3 consecutive normal Pap tests.  HPV screening.** / Every 3 years from ages 37 through ages 78 to 51 with a history of 3 consecutive normal Pap tests.  Hepatitis C blood test.** / For any individual with known risks for hepatitis C.  Skin self-exam. / Monthly.  Influenza vaccine. / Every year.  Tetanus, diphtheria, and acellular pertussis (Tdap, Td) vaccine.** / Consult your health care provider. Pregnant women should receive  1 dose of Tdap vaccine during each pregnancy. 1 dose of Td every 10 years.  Varicella vaccine.** / Consult your health care provider. Pregnant females who do not have evidence of immunity should receive the first dose after pregnancy.  HPV vaccine. / 3 doses over 6 months, if 2 and younger. The vaccine is not recommended for use in pregnant females. However, pregnancy testing is not needed before receiving a dose.  Measles, mumps, rubella (MMR) vaccine.** / You need at least 1 dose of MMR if you were born in 1957 or later. You may also need a 2nd dose. For females of childbearing age, rubella immunity should be determined. If there is no evidence of immunity, females who are not pregnant should be vaccinated. If there is no evidence of immunity, females who are pregnant should delay immunization until after pregnancy.  Pneumococcal 13-valent conjugate (PCV13) vaccine.** / Consult your health care provider.  Pneumococcal polysaccharide (PPSV23) vaccine.** / 1 to 2 doses if you smoke cigarettes or if you have certain conditions.  Meningococcal vaccine.** / 1 dose if you are age 12 to 37 years and a Market researcher living in a residence hall,  or have one of several medical conditions, you need to get vaccinated against meningococcal disease. You may also need additional booster doses.  Hepatitis A vaccine.** / Consult your health care provider.  Hepatitis B vaccine.** / Consult your health care provider.  Haemophilus influenzae type b (Hib) vaccine.** / Consult your health care provider.  Ages 68 to 64years  Blood pressure check.** / Every 1 to 2 years.  Lipid and cholesterol check.** / Every 5 years beginning at age 20 years.  Lung cancer screening. / Every year if you are aged 65 80 years and have a 30-pack-year history of smoking and currently smoke or have quit within the past 15 years. Yearly screening is stopped once you have quit smoking for at least 15 years or develop a  health problem that would prevent you from having lung cancer treatment.  Clinical breast exam.** / Every year after age 87 years.  BRCA-related cancer risk assessment.** / For women who have family members with a BRCA-related cancer (breast, ovarian, tubal, or peritoneal cancers).  Mammogram.** / Every year beginning at age 27 years and continuing for as long as you are in good health. Consult with your health care provider.  Pap test.** / Every 3 years starting at age 43 years through age 42 or 61 years with a history of 3 consecutive normal Pap tests.  HPV screening.** / Every 3 years from ages 75 years through ages 26 to 41 years with a history of 3 consecutive normal Pap tests.  Fecal occult blood test (FOBT) of stool. / Every year beginning at age 47 years and continuing until age 73 years. You may not need to do this test if you get a colonoscopy every 10 years.  Flexible sigmoidoscopy or colonoscopy.** / Every 5 years for a flexible sigmoidoscopy or every 10 years for a colonoscopy beginning at age 49 years and continuing until age 84 years.  Hepatitis C blood test.** / For all people born from [birth year] through 1965 and any individual with known risks for hepatitis C.  Skin self-exam. / Monthly.  Influenza vaccine. / Every year.  Tetanus, diphtheria, and acellular pertussis (Tdap/Td) vaccine.** / Consult your health care provider. Pregnant women should receive 1 dose of Tdap vaccine during each pregnancy. 1 dose of Td every 10 years.  Varicella vaccine.** / Consult your health care provider. Pregnant females who do not have evidence of immunity should receive the first dose after pregnancy.  Zoster vaccine.** / 1 dose for adults aged 57 years or older.  Measles, mumps, rubella (MMR) vaccine.** / You need at least 1 dose of MMR if you were born in 1957 or later. You may also need a 2nd dose. For females of childbearing age, rubella immunity should be determined. If there is no  evidence of immunity, females who are not pregnant should be vaccinated. If there is no evidence of immunity, females who are pregnant should delay immunization until after pregnancy.  Pneumococcal 13-valent conjugate (PCV13) vaccine.** / Consult your health care provider.  Pneumococcal polysaccharide (PPSV23) vaccine.** / 1 to 2 doses if you smoke cigarettes or if you have certain conditions.  Meningococcal vaccine.** / Consult your health care provider.  Hepatitis A vaccine.** / Consult your health care provider.  Hepatitis B vaccine.** / Consult your health care provider.  Haemophilus influenzae type b (Hib) vaccine.** / Consult your health care provider.  Ages 79 years and over  Blood pressure check.** / Every 1 to 2 years.  Lipid and cholesterol check.** /  Every 5 years beginning at age 66 years.  Lung cancer screening. / Every year if you are aged 70 80 years and have a 30-pack-year history of smoking and currently smoke or have quit within the past 15 years. Yearly screening is stopped once you have quit smoking for at least 15 years or develop a health problem that would prevent you from having lung cancer treatment.  Clinical breast exam.** / Every year after age 58 years.  BRCA-related cancer risk assessment.** / For women who have family members with a BRCA-related cancer (breast, ovarian, tubal, or peritoneal cancers).  Mammogram.** / Every year beginning at age 7 years and continuing for as long as you are in good health. Consult with your health care provider.  Pap test.** / Every 3 years starting at age 55 years through age 28 or 12 years with 3 consecutive normal Pap tests. Testing can be stopped between 65 and 70 years with 3 consecutive normal Pap tests and no abnormal Pap or HPV tests in the past 10 years.  HPV screening.** / Every 3 years from ages 44 years through ages 72 or 60 years with a history of 3 consecutive normal Pap tests. Testing can be stopped between  65 and 70 years with 3 consecutive normal Pap tests and no abnormal Pap or HPV tests in the past 10 years.  Fecal occult blood test (FOBT) of stool. / Every year beginning at age 34 years and continuing until age 30 years. You may not need to do this test if you get a colonoscopy every 10 years.  Flexible sigmoidoscopy or colonoscopy.** / Every 5 years for a flexible sigmoidoscopy or every 10 years for a colonoscopy beginning at age 75 years and continuing until age 52 years.  Hepatitis C blood test.** / For all people born from [birth year] through 1965 and any individual with known risks for hepatitis C.  Osteoporosis screening.** / A one-time screening for women ages 33 years and over and women at risk for fractures or osteoporosis.  Skin self-exam. / Monthly.  Influenza vaccine. / Every year.  Tetanus, diphtheria, and acellular pertussis (Tdap/Td) vaccine.** / 1 dose of Td every 10 years.  Varicella vaccine.** / Consult your health care provider.  Zoster vaccine.** / 1 dose for adults aged 48 years or older.  Pneumococcal 13-valent conjugate (PCV13) vaccine.** / Consult your health care provider.  Pneumococcal polysaccharide (PPSV23) vaccine.** / 1 dose for all adults aged 75 years and older.  Meningococcal vaccine.** / Consult your health care provider.  Hepatitis A vaccine.** / Consult your health care provider.  Hepatitis B vaccine.** / Consult your health care provider.  Haemophilus influenzae type b (Hib) vaccine.** / Consult your health care provider. ** Family history and personal history of risk and conditions may change your health care provider's recommendations. Document Released: 12/01/2001 Document Revised: 07/26/2013  [facility] Patient Information 2014 [place], [place].   EXERCISE AND DIET:  We recommended that you start or continue a regular exercise program for good health. Regular exercise means any activity that makes your heart beat faster and makes you sweat.  We  recommend exercising at least 30 minutes per day at least 3 days a week, preferably 5.  We also recommend a diet low in fat and sugar / carbohydrates.  Inactivity, poor dietary choices and obesity can cause diabetes, heart attack, stroke, and kidney damage, among others.     ALCOHOL AND SMOKING:  Women should limit their alcohol intake to no more than 7  drinks/beers/glasses of wine (combined, not each!) per week. Moderation of alcohol intake to this level decreases your risk of breast cancer and liver damage.  ( And of course, no recreational drugs are part of a healthy lifestyle.)  Also, you should not be smoking at all or even being exposed to second hand smoke. Most people know smoking can cause cancer, and various heart and lung diseases, but did you know it also contributes to weakening of your bones?  Aging of your skin?  Yellowing of your teeth and nails?   CALCIUM AND VITAMIN D:  Adequate intake of calcium and Vitamin D are recommended.  The recommendations for exact amounts of these supplements seem to change often, but generally speaking 600 mg of calcium (either carbonate or citrate) and 800 units of Vitamin D per day seems prudent. Certain women may benefit from higher intake of Vitamin D.  If you are among these women, your doctor will have told you during your visit.     PAP SMEARS:  Pap smears, to check for cervical cancer or precancers,  have traditionally been done yearly, although recent scientific advances have shown that most women can have pap smears less often.  However, every woman still should have a physical exam from her gynecologist or primary care physician every year. It will include a breast check, inspection of the vulva and vagina to check for abnormal growths or skin changes, a visual exam of the cervix, and then an exam to evaluate the size and shape of the uterus and ovaries.  And after 72 years of age, a rectal exam is indicated to check for rectal cancers. We will  also provide age appropriate advice regarding health maintenance, like when you should have certain vaccines, screening for sexually transmitted diseases, bone density testing, colonoscopy, mammograms, etc.    MAMMOGRAMS:  All women over 68 years old should have a yearly mammogram. Many facilities now offer a "3D" mammogram, which may cost around $50 extra out of pocket. If possible,  we recommend you accept the option to have the 3D mammogram performed.  It both reduces the number of women who will be called back for extra views which then turn out to be normal, and it is better than the routine mammogram at detecting truly abnormal areas.     COLONOSCOPY:  Colonoscopy to screen for colon cancer is recommended for all women at age 75.  We know, you hate the idea of the prep.  We agree, BUT, having colon cancer and not knowing it is worse!!  Colon cancer so often starts as a polyp that can be seen and removed at colonscopy, which can quite literally save your life!  And if your first colonoscopy is normal and you have no family history of colon cancer, most women don't have to have it again for 10 years.  Once every ten years, you can do something that may end up saving your life, right?  We will be happy to help you get it scheduled when you are ready.  Be sure to check your insurance coverage so you understand how much it will cost.  It may be covered as a preventative service at no cost, but you should check your particular policy.

## 2018-05-19 NOTE — Progress Notes (Signed)
Subjective:   Carla Lewis is a 72 y.o. female who presents for Medicare Annual (Subsequent) preventive examination.  Activities of Daily Living In your present state of health, do you have difficulty performing the following activities?  1- Driving - no 2- Managing money - no 3- Feeding yourself - no 4- Getting from the bed to the chair - no 5- Climbing a flight of stairs - no 6- Preparing food and eating - no 7- Bathing or showering - no 8- Getting dressed - no 9- Getting to the toilet - no 10- Using the toilet - no 11- Moving around from place to place - no  Patient states that she feels safe at home.    Fall Risk  04/12/2018 08/02/2017 05/18/2017 02/10/2017  Falls in the past year? No No No No   Depression screen [facility] 2/9 05/19/2018 04/12/2018 04/12/2018 08/02/2017 05/18/2017  Decreased Interest 0 0 0 0 0  Down, Depressed, Hopeless 0 0 0 0 0  PHQ - 2 Score 0 0 0 0 0  Altered sleeping 0 0 - - -  Tired, decreased energy 0 0 - - -  Change in appetite 0 0 - - -  Feeling bad or failure about yourself  0 0 - - -  Trouble concentrating 0 0 - - -  Moving slowly or fidgety/restless 0 0 - - -  Suicidal thoughts 0 0 - - -  PHQ-9 Score 0 0 - - -  Difficult doing work/chores Not difficult at all Not difficult at all - - -   No flowsheet data found..       Objective:     Vitals: BP 129/71   Pulse 72   Ht 5\' 9"  (1.753 m)   Wt 192 lb 6.4 oz (87.3 kg)   SpO2 98%   BMI 28.41 kg/m   Body mass index is 28.41 kg/m.  Advanced Directives 10/07/2017 08/11/2016  Does Patient Have a Medical Advance Directive? No No  Would patient like information on creating a medical advance directive? No - Patient declined Yes - Educational materials given   6CIT Screen 05/19/2018  What Year? 0 points  What month? 0 points  What time? 0 points  Count back from 20 0 points  Months in reverse 0 points  Repeat phrase 0 points  Total Score 0    Current Exercise Habits: Home exercise  routine, Type of exercise: walking, Frequency (Times/Week): 7, Intensity: Mild    Functional Status Survey: Is the patient deaf or have difficulty hearing?: No Does the patient have difficulty seeing, even when wearing glasses/contacts?: No Does the patient have difficulty concentrating, remembering, or making decisions?: No Does the patient have difficulty walking or climbing stairs?: No Does the patient have difficulty dressing or bathing?: No Does the patient have difficulty doing errands alone such as visiting a doctor's office or shopping?: No  Tobacco Social History   Tobacco Use  Smoking Status Never Smoker  Smokeless Tobacco Never Used     Counseling given: Not Answered   Clinical Intake:                       Past Medical History:  Diagnosis Date  . Ankle bruise    SPONTANEOUS BRUISE ON RIGHT ANKLE  . Anxiety   . Complication of anesthesia   . GERD (gastroesophageal reflux disease)   . History of hiatal hernia   . Hyperlipidemia   . Hypertension   . Kidney cysts   .  Liver cyst   . Liver cyst   . Lumbar stenosis   . LVH (left ventricular hypertrophy)    MILD LVH per echo in 2007  . Mitral valve regurgitation   . Obesity   . Palpitations   . PONV (postoperative nausea and vomiting)    Past Surgical History:  Procedure Laterality Date  . BREAST LUMPECTOMY Right 1995   RIGHT BREAST  . CHOLECYSTECTOMY    . ESOPHAGEAL MANOMETRY N/A 06/23/2017   Procedure: ESOPHAGEAL MANOMETRY (EM);  Surgeon: Juanita Craver, MD;  Location: [facility] END[facility]COPY;  Service: Endoscopy;  Laterality: N/A;  . LIPOMA EXCISION    . MENISCUS REPAIR Left   . US ECHOCARDIOGRAPHY  2007   SHOWED EF OF 55-60%   Family History  Problem Relation Age of Onset  . Heart disease Father 70  . Hypertension Father 70  . AAA (abdominal aortic aneurysm) Father   . Dementia Mother   . Heart disease Mother        cardiomyopathy   Social History   Socioeconomic History  . Marital status:  Married    Spouse name: Not on file  . Number of children: Not on file  . Years of education: Not on file  . Highest education level: Not on file  Occupational History  . Not on file  Social Needs  . Financial resource strain: Not on file  . Food insecurity:    Worry: Not on file    Inability: Not on file  . Transportation needs:    Medical: Not on file    Non-medical: Not on file  Tobacco Use  . Smoking status: Never Smoker  . Smokeless tobacco: Never Used  Substance and Sexual Activity  . Alcohol use: No  . Drug use: No  . Sexual activity: Never  Lifestyle  . Physical activity:    Days per week: Not on file    Minutes per session: Not on file  . Stress: Not on file  Relationships  . Social connections:    Talks on phone: Not on file    Gets together: Not on file    Attends religious service: Not on file    Active member of club or organization: Not on file    Attends meetings of clubs or organizations: Not on file    Relationship status: Not on file  Other Topics Concern  . Not on file  Social History Narrative  . Not on file    Outpatient Encounter Medications as of 05/19/2018  Medication Sig  . acetaminophen (TYLENOL) 500 MG tablet Take 500-1,000 mg by mouth every 8 (eight) hours as needed for mild pain or moderate pain.  . cetirizine (ZYRTEC) 10 MG tablet Take 10 mg by mouth daily.  . Cholecalciferol (VITAMIN D) 2000 units tablet Take 2,000 Units by mouth daily.   Marland Kitchen docusate sodium (COLACE) 100 MG capsule Take 200 mg by mouth daily.  Marland Kitchen esomeprazole (NEXIUM) 20 MG capsule Take 20 mg by mouth every morning.   . fluticasone (FLONASE) 50 MCG/ACT nasal spray Place 2 sprays into both nostrils daily as needed for allergies. After sinus rinses  . Glucosamine-Chondroitin (MOVE FREE PO) Take 1 capsule by mouth daily.   Marland Kitchen lisinopril-hydrochlorothiazide (PRINZIDE,ZESTORETIC) 20-25 MG tablet Take 1 tablet by mouth daily.  . montelukast (SINGULAIR) 10 MG tablet Take 1 tablet  (10 mg total) by mouth at bedtime. (Patient taking differently: Take 5 mg by mouth at bedtime. )  . pravastatin (PRAVACHOL) 20 MG tablet Take 20 mg  by mouth at bedtime.  . sertraline (ZOLOFT) 25 MG tablet Take 1 tablet (25 mg total) by mouth daily.  . Wheat Dextrin (BENEFIBER PO) Take 10 mLs by mouth 2 (two) times daily. Take 2 teaspoonfuls by mouth twice daily  . Vitamin D, Ergocalciferol, (DRISDOL) 50000 units CAPS capsule Take 1 capsule (50,000 Units total) by mouth every 7 (seven) days.   No facility-administered encounter medications on file as of 05/19/2018.     Activities of Daily Living In your present state of health, do you have any difficulty performing the following activities: 05/19/2018 10/07/2017  Hearing? N N  Vision? N N  Difficulty concentrating or making decisions? N N  Walking or climbing stairs? N N  Dressing or bathing? N N  Doing errands, shopping? N N  Some recent data might be hidden    Patient Care Team: Mellody Dance, DO as PCP - General (Family Medicine) Martinique, Peter M, MD as Consulting Physician (Cardiology) Elsie Saas, MD as Consulting Physician (Orthopedic Surgery) Juanita Craver, MD as Consulting Physician (Gastroenterology) Paula Compton, MD as Consulting Physician (Obstetrics and Gynecology) [facility] (Dermatology) Jackolyn Confer, MD as Consulting Physician (General Surgery)    Assessment:   This is a routine wellness examination for Karstyn.  Exercise Activities and Dietary recommendations Current Exercise Habits: Home exercise routine, Type of exercise: walking, Frequency (Times/Week): 7, Intensity: Mild  Goals    None      Fall Risk Fall Risk  04/12/2018 08/02/2017 05/18/2017 02/10/2017  Falls in the past year? No No No No   Is the patient's home free of loose throw rugs in walkways, pet beds, electrical cords, etc?   yes      Grab bars in the bathroom? no      Handrails on the stairs?   yes      Adequate lighting?    yes  Timed Get Up and Go performed: passed  Depression Screen PHQ 2/9 Scores 05/19/2018 04/12/2018 04/12/2018 08/02/2017  PHQ - 2 Score 0 0 0 0  PHQ- 9 Score 0 0 - -     Cognitive Function     6CIT Screen 05/19/2018  What Year? 0 points  What month? 0 points  What time? 0 points  Count back from 20 0 points  Months in reverse 0 points  Repeat phrase 0 points  Total Score 0    Immunization History  Administered Date(s) Administered  . Pneumococcal Conjugate-13 03/06/2014  . Pneumococcal Polysaccharide-23 01/12/2012  . Tdap 10/19/2010    Qualifies for Shingles Vaccine? Info given  Screening Tests Health Maintenance  Topic Date Due  . INFLUENZA VACCINE  05/19/2018  . Hepatitis C Screening  02/17/2019 (Originally Feb 13, 1946)  . MAMMOGRAM  05/06/2019  . TETANUS/TDAP  10/19/2020  . COLONOSCOPY  04/08/2027  . DEXA SCAN  Completed  . PNA vac Low Risk Adult  Completed    Cancer Screenings: Lung: Low Dose CT Chest recommended if Age 12-80 years, 30 pack-year currently smoking OR have quit w/in 15years. Patient does not qualify. Breast:  Up to date on Mammogram? No  Patient due to call and make appointment Up to date of Bone Density/Dexa? No  Ordered today Colorectal: 0/20/2018  Additional Screenings: : Hepatitis C Screening:      Plan:      I have personally reviewed and noted the following in the patient's chart:   . Medical and social history . Use of alcohol, tobacco or illicit drugs  . Current medications and supplements . Functional  ability and status . Nutritional status . Physical activity . Advanced directives . List of other physicians . Hospitalizations, surgeries, and ER visits in previous 12 months . Vitals . Screenings to include cognitive, depression, and falls . Referrals and appointments  In addition, I have reviewed and discussed with patient certain preventive protocols, quality metrics, and best practice recommendations. A written  personalized care plan for preventive services as well as general preventive health recommendations were provided to patient.     Mellody Dance, DO  05/19/2018

## 2018-05-20 LAB — ALT: ALT: 7 IU/L (ref 0–32)

## 2018-05-23 ENCOUNTER — Other Ambulatory Visit: Payer: Medicare Other

## 2018-05-23 NOTE — Progress Notes (Signed)
10-07-17 (Epic) EKG

## 2018-05-23 NOTE — Patient Instructions (Addendum)
Temperence Zenor Goudeau  05/23/2018   Your procedure is scheduled on: 05-31-18   Report to [facility] Main  Entrance    Report to admitting at 11:00 AM    Call this number if you have problems the morning of surgery [phone]   Remember: Do not eat food or drink liquids :After Midnight. You may have a Clear Liquid Diet from Midnight until 7:00 AM. After 7:00 AM, nothing until after surgery.     CLEAR LIQUID DIET   Foods Allowed                                                                     Foods Excluded  Coffee and tea, regular and decaf                             liquids that you cannot  Plain Jell-O in any flavor                                             see through such as: Fruit ices (not with fruit pulp)                                     milk, soups, orange juice  Iced Popsicles                                    All solid food Carbonated beverages, regular and diet                                    Cranberry, grape and apple juices Sports drinks like Gatorade Lightly seasoned clear broth or consume(fat free) Sugar, honey syrup  Sample Menu Breakfast                                Lunch                                     Supper Cranberry juice                    Beef broth                            Chicken broth Jell-O                                     Grape juice  Apple juice Coffee or tea                        Jell-O                                      Popsicle                                                Coffee or tea                        Coffee or tea  _____________________________________________________________________     Take these medicines the morning of surgery with A SIP OF WATER: Cetrizine (Zyrtec), Esomeprazole (Nexium), and Sertraline (Zoloft). You may bring and use your Flonase as needed.                                 You may not have any metal on your body including hair pins and               piercings  Do not wear jewelry, make-up, lotions, powders or perfumes, deodorant             Do not wear nail polish.  Do not shave  48 hours prior to surgery.     Do not bring valuables to the hospital. [place].  Contacts, dentures or bridgework may not be worn into surgery.  Leave suitcase in the car. After surgery it may be brought to your room.    Special Instructions: N/A              Please read over the following fact sheets you were given: _____________________________________________________________________             [facility] - Preparing for Surgery Before surgery, you can play an important role.  Because skin is not sterile, your skin needs to be as free of germs as possible.  You can reduce the number of germs on your skin by washing with CHG (chlorahexidine gluconate) soap before surgery.  CHG is an antiseptic cleaner which kills germs and bonds with the skin to continue killing germs even after washing. Please DO NOT use if you have an allergy to CHG or antibacterial soaps.  If your skin becomes reddened/irritated stop using the CHG and inform your nurse when you arrive at Short Stay. Do not shave (including legs and underarms) for at least 48 hours prior to the first CHG shower.  You may shave your face/neck. Please follow these instructions carefully:  1.  Shower with CHG Soap the night before surgery and the  morning of Surgery.  2.  If you choose to wash your hair, wash your hair first as usual with your  normal  shampoo.  3.  After you shampoo, rinse your hair and body thoroughly to remove the  shampoo.                           4.  Use CHG as you would any  other liquid soap.  You can apply chg directly  to the skin and wash                       Gently with a scrungie or clean washcloth.  5.  Apply the CHG Soap to your body ONLY FROM THE NECK DOWN.   Do not use on face/ open                            Wound or open sores. Avoid contact with eyes, ears mouth and genitals (private parts).                       Wash face,  Genitals (private parts) with your normal soap.             6.  Wash thoroughly, paying special attention to the area where your surgery  will be performed.  7.  Thoroughly rinse your body with warm water from the neck down.  8.  DO NOT shower/wash with your normal soap after using and rinsing off  the CHG Soap.                9.  Pat yourself dry with a clean towel.            10.  Wear clean pajamas.            11.  Place clean sheets on your bed the night of your first shower and do not  sleep with pets. Day of Surgery : Do not apply any lotions/deodorants the morning of surgery.  Please wear clean clothes to the hospital/surgery center.  FAILURE TO FOLLOW THESE INSTRUCTIONS MAY RESULT IN THE CANCELLATION OF YOUR SURGERY PATIENT SIGNATURE_________________________________  NURSE SIGNATURE__________________________________  ________________________________________________________________________

## 2018-05-24 ENCOUNTER — Encounter (HOSPITAL_COMMUNITY)
Admission: RE | Admit: 2018-05-24 | Discharge: 2018-05-24 | Disposition: A | Discharge status: Home / Self Care | Payer: Medicare Other | Source: Ambulatory Visit | Attending: General Surgery | Admitting: General Surgery

## 2018-05-24 ENCOUNTER — Other Ambulatory Visit: Payer: Self-pay

## 2018-05-24 ENCOUNTER — Encounter (HOSPITAL_COMMUNITY): Payer: Self-pay

## 2018-05-24 DIAGNOSIS — Z01812 Encounter for preprocedural laboratory examination: Secondary | ICD-10-CM | POA: Diagnosis not present

## 2018-05-24 LAB — BASIC METABOLIC PANEL
ANION GAP: 8 (ref 5–15)
BUN: 17 mg/dL (ref 8–23)
CHLORIDE: 99 mmol/L (ref 98–111)
CO2: 29 mmol/L (ref 22–32)
Calcium: 9.3 mg/dL (ref 8.9–10.3)
Creatinine, Ser: 0.79 mg/dL (ref 0.44–1.00)
GFR calc non Af Amer: >60 mL/min (ref >60)
Glucose, Bld: 69 mg/dL — ABNORMAL LOW (ref 70–99)
POTASSIUM: 4 mmol/L (ref 3.5–5.1)
Sodium: 136 mmol/L (ref 135–145)

## 2018-05-24 LAB — CBC
HEMATOCRIT: 39.7 % (ref 36.0–46.0)
Hemoglobin: 13.1 g/dL (ref 12.0–15.0)
MCH: 26.5 pg (ref 26.0–34.0)
MCHC: 33 g/dL (ref 30.0–36.0)
MCV: 80.2 fL (ref 78.0–100.0)
Platelets: 408 10*3/uL — ABNORMAL HIGH (ref 150–400)
RBC: 4.95 MIL/uL (ref 3.87–5.11)
RDW: 14.5 % (ref 11.5–15.5)
WBC: 5.3 10*3/uL (ref 4.0–10.5)

## 2018-05-26 ENCOUNTER — Other Ambulatory Visit: Payer: Self-pay | Admitting: Family Medicine

## 2018-05-26 DIAGNOSIS — Z1231 Encounter for screening mammogram for malignant neoplasm of breast: Secondary | ICD-10-CM

## 2018-05-31 ENCOUNTER — Encounter (HOSPITAL_COMMUNITY): Payer: Self-pay | Admitting: *Deleted

## 2018-05-31 ENCOUNTER — Observation Stay (HOSPITAL_COMMUNITY)
Admission: RE | Admit: 2018-05-31 | Discharge: 2018-06-02 | Disposition: A | Discharge status: Home / Self Care | Payer: Medicare Other | Source: Ambulatory Visit | Attending: General Surgery | Admitting: General Surgery

## 2018-05-31 ENCOUNTER — Ambulatory Visit: Payer: Self-pay | Admitting: General Surgery

## 2018-05-31 ENCOUNTER — Inpatient Hospital Stay (HOSPITAL_COMMUNITY): Payer: Medicare Other | Admitting: Anesthesiology

## 2018-05-31 ENCOUNTER — Encounter (HOSPITAL_COMMUNITY)
Admission: RE | Disposition: A | Discharge status: Home / Self Care | Payer: Self-pay | Source: Ambulatory Visit | Attending: General Surgery

## 2018-05-31 ENCOUNTER — Other Ambulatory Visit: Payer: Self-pay

## 2018-05-31 DIAGNOSIS — K219 Gastro-esophageal reflux disease without esophagitis: Secondary | ICD-10-CM | POA: Insufficient documentation

## 2018-05-31 DIAGNOSIS — Z79899 Other long term (current) drug therapy: Secondary | ICD-10-CM | POA: Diagnosis not present

## 2018-05-31 DIAGNOSIS — E785 Hyperlipidemia, unspecified: Secondary | ICD-10-CM | POA: Insufficient documentation

## 2018-05-31 DIAGNOSIS — Z7951 Long term (current) use of inhaled steroids: Secondary | ICD-10-CM | POA: Insufficient documentation

## 2018-05-31 DIAGNOSIS — F419 Anxiety disorder, unspecified: Secondary | ICD-10-CM | POA: Diagnosis not present

## 2018-05-31 DIAGNOSIS — Z9889 Other specified postprocedural states: Secondary | ICD-10-CM | POA: Diagnosis present

## 2018-05-31 DIAGNOSIS — K449 Diaphragmatic hernia without obstruction or gangrene: Principal | ICD-10-CM | POA: Insufficient documentation

## 2018-05-31 DIAGNOSIS — Z791 Long term (current) use of non-steroidal anti-inflammatories (NSAID): Secondary | ICD-10-CM | POA: Diagnosis not present

## 2018-05-31 DIAGNOSIS — I10 Essential (primary) hypertension: Secondary | ICD-10-CM | POA: Insufficient documentation

## 2018-05-31 HISTORY — PX: INSERTION OF MESH: SHX5868

## 2018-05-31 SURGERY — FUNDOPLICATION, NISSEN, ROBOT-ASSISTED, LAPAROSCOPIC
Anesthesia: General

## 2018-05-31 MED ORDER — DEXTROSE-NACL 5-0.9 % IV SOLN
INTRAVENOUS | Status: DC
  Administered 2018-05-31 – 2018-06-01 (×3): via INTRAVENOUS

## 2018-05-31 MED ORDER — ENOXAPARIN SODIUM 40 MG/0.4ML ~~LOC~~ SOLN
40.0000 mg | SUBCUTANEOUS | Status: DC
  Administered 2018-06-01 – 2018-06-02 (×2): 40 mg via SUBCUTANEOUS
  Filled 2018-05-31 (×2): qty 0.4

## 2018-05-31 MED ORDER — FENTANYL CITRATE (PF) 100 MCG/2ML IJ SOLN
INTRAMUSCULAR | Status: AC
  Filled 2018-05-31: qty 2

## 2018-05-31 MED ORDER — FENTANYL CITRATE (PF) 250 MCG/5ML IJ SOLN
INTRAMUSCULAR | Status: DC | PRN
  Administered 2018-05-31: 50 ug via INTRAVENOUS
  Administered 2018-05-31: 150 ug via INTRAVENOUS

## 2018-05-31 MED ORDER — EPHEDRINE SULFATE-NACL 50-0.9 MG/10ML-% IV SOSY
PREFILLED_SYRINGE | INTRAVENOUS | Status: DC | PRN
  Administered 2018-05-31 (×3): 5 mg via INTRAVENOUS

## 2018-05-31 MED ORDER — LIDOCAINE 2% (20 MG/ML) 5 ML SYRINGE
INTRAMUSCULAR | Status: DC | PRN
  Administered 2018-05-31: 1 mg/kg/h via INTRAVENOUS

## 2018-05-31 MED ORDER — SUGAMMADEX SODIUM 200 MG/2ML IV SOLN
INTRAVENOUS | Status: DC | PRN
  Administered 2018-05-31: 350 mg via INTRAVENOUS

## 2018-05-31 MED ORDER — ROCURONIUM BROMIDE 100 MG/10ML IV SOLN
INTRAVENOUS | Status: DC | PRN
  Administered 2018-05-31: 10 mg via INTRAVENOUS
  Administered 2018-05-31: 20 mg via INTRAVENOUS
  Administered 2018-05-31 (×2): 10 mg via INTRAVENOUS
  Administered 2018-05-31: 50 mg via INTRAVENOUS

## 2018-05-31 MED ORDER — SCOPOLAMINE 1 MG/3DAYS TD PT72
1.0000 | MEDICATED_PATCH | TRANSDERMAL | Status: DC
  Administered 2018-05-31: 1 via TRANSDERMAL

## 2018-05-31 MED ORDER — SCOPOLAMINE 1 MG/3DAYS TD PT72
MEDICATED_PATCH | TRANSDERMAL | Status: AC
  Filled 2018-05-31: qty 1

## 2018-05-31 MED ORDER — BUPIVACAINE-EPINEPHRINE (PF) 0.25% -1:200000 IJ SOLN
INTRAMUSCULAR | Status: AC
  Filled 2018-05-31: qty 30

## 2018-05-31 MED ORDER — DEXAMETHASONE SODIUM PHOSPHATE 10 MG/ML IJ SOLN
INTRAMUSCULAR | Status: AC
  Filled 2018-05-31: qty 1

## 2018-05-31 MED ORDER — FENTANYL CITRATE (PF) 100 MCG/2ML IJ SOLN
25.0000 ug | INTRAMUSCULAR | Status: DC | PRN
  Administered 2018-05-31 (×3): 50 ug via INTRAVENOUS

## 2018-05-31 MED ORDER — ROCURONIUM BROMIDE 10 MG/ML (PF) SYRINGE
PREFILLED_SYRINGE | INTRAVENOUS | Status: AC
  Filled 2018-05-31: qty 10

## 2018-05-31 MED ORDER — GABAPENTIN 300 MG PO CAPS
300.0000 mg | ORAL_CAPSULE | ORAL | Status: AC
  Administered 2018-05-31: 300 mg via ORAL
  Filled 2018-05-31: qty 1

## 2018-05-31 MED ORDER — MIDAZOLAM HCL 2 MG/2ML IJ SOLN
INTRAMUSCULAR | Status: DC | PRN
  Administered 2018-05-31: 2 mg via INTRAVENOUS

## 2018-05-31 MED ORDER — PROPOFOL 10 MG/ML IV BOLUS
INTRAVENOUS | Status: DC | PRN
  Administered 2018-05-31: 100 mg via INTRAVENOUS
  Administered 2018-05-31: 60 mg via INTRAVENOUS

## 2018-05-31 MED ORDER — SUGAMMADEX SODIUM 200 MG/2ML IV SOLN
INTRAVENOUS | Status: AC
  Filled 2018-05-31: qty 2

## 2018-05-31 MED ORDER — ONDANSETRON HCL 4 MG/2ML IJ SOLN
INTRAMUSCULAR | Status: DC | PRN
  Administered 2018-05-31: 4 mg via INTRAVENOUS

## 2018-05-31 MED ORDER — PROPOFOL 10 MG/ML IV BOLUS
INTRAVENOUS | Status: AC
Start: 2018-05-31 — End: ?
  Filled 2018-05-31: qty 20

## 2018-05-31 MED ORDER — DEXAMETHASONE SODIUM PHOSPHATE 10 MG/ML IJ SOLN
INTRAMUSCULAR | Status: DC | PRN
  Administered 2018-05-31: 10 mg via INTRAVENOUS

## 2018-05-31 MED ORDER — KETOROLAC TROMETHAMINE 30 MG/ML IJ SOLN
30.0000 mg | Freq: Four times a day (QID) | INTRAMUSCULAR | Status: AC | PRN
  Administered 2018-05-31 – 2018-06-01 (×2): 30 mg via INTRAVENOUS
  Filled 2018-05-31 (×2): qty 1

## 2018-05-31 MED ORDER — ACETAMINOPHEN 500 MG PO TABS
1000.0000 mg | ORAL_TABLET | ORAL | Status: AC
  Administered 2018-05-31: 1000 mg via ORAL
  Filled 2018-05-31: qty 2

## 2018-05-31 MED ORDER — LIDOCAINE 2% (20 MG/ML) 5 ML SYRINGE
INTRAMUSCULAR | Status: DC | PRN
  Administered 2018-05-31: 100 mg via INTRAVENOUS

## 2018-05-31 MED ORDER — CHLORHEXIDINE GLUCONATE CLOTH 2 % EX PADS: 6.0000 | MEDICATED_PAD | Freq: Once | CUTANEOUS | Status: DC

## 2018-05-31 MED ORDER — ONDANSETRON HCL 4 MG/2ML IJ SOLN
4.0000 mg | Freq: Four times a day (QID) | INTRAMUSCULAR | Status: DC | PRN
  Administered 2018-06-01 – 2018-06-02 (×3): 4 mg via INTRAVENOUS
  Filled 2018-05-31 (×4): qty 2

## 2018-05-31 MED ORDER — BUPIVACAINE-EPINEPHRINE 0.25% -1:200000 IJ SOLN
INTRAMUSCULAR | Status: DC | PRN
  Administered 2018-05-31: 25 mL

## 2018-05-31 MED ORDER — CEFAZOLIN SODIUM-DEXTROSE 2-4 GM/100ML-% IV SOLN
2.0000 g | INTRAVENOUS | Status: AC
  Administered 2018-05-31: 2 g via INTRAVENOUS
  Filled 2018-05-31: qty 100

## 2018-05-31 MED ORDER — ONDANSETRON 4 MG PO TBDP: 4.0000 mg | ORAL_TABLET | Freq: Four times a day (QID) | ORAL | Status: DC | PRN

## 2018-05-31 MED ORDER — LIDOCAINE HCL 2 % IJ SOLN
INTRAMUSCULAR | Status: AC
  Filled 2018-05-31: qty 20

## 2018-05-31 MED ORDER — ACETAMINOPHEN 10 MG/ML IV SOLN
1000.0000 mg | Freq: Four times a day (QID) | INTRAVENOUS | Status: AC
  Administered 2018-05-31 – 2018-06-01 (×4): 1000 mg via INTRAVENOUS
  Filled 2018-05-31 (×4): qty 100

## 2018-05-31 MED ORDER — FENTANYL CITRATE (PF) 250 MCG/5ML IJ SOLN
INTRAMUSCULAR | Status: AC
  Filled 2018-05-31: qty 5

## 2018-05-31 MED ORDER — FLUTICASONE PROPIONATE 50 MCG/ACT NA SUSP
2.0000 | Freq: Every day | NASAL | Status: DC | PRN
  Filled 2018-05-31: qty 16

## 2018-05-31 MED ORDER — ONDANSETRON HCL 4 MG/2ML IJ SOLN: 4.0000 mg | Freq: Once | INTRAMUSCULAR | Status: DC | PRN

## 2018-05-31 MED ORDER — LACTATED RINGERS IV SOLN
INTRAVENOUS | Status: DC
  Administered 2018-05-31 (×2): via INTRAVENOUS

## 2018-05-31 MED ORDER — KETAMINE HCL 10 MG/ML IJ SOLN
INTRAMUSCULAR | Status: DC | PRN
  Administered 2018-05-31: 30 mg via INTRAVENOUS

## 2018-05-31 MED ORDER — MIDAZOLAM HCL 2 MG/2ML IJ SOLN
INTRAMUSCULAR | Status: AC
  Filled 2018-05-31: qty 2

## 2018-05-31 MED ORDER — HYDROMORPHONE HCL 1 MG/ML IJ SOLN
1.0000 mg | INTRAMUSCULAR | Status: DC | PRN
  Filled 2018-05-31: qty 1

## 2018-05-31 MED ORDER — ONDANSETRON HCL 4 MG/2ML IJ SOLN
INTRAMUSCULAR | Status: AC
  Filled 2018-05-31: qty 2

## 2018-05-31 MED ORDER — HYDRALAZINE HCL 20 MG/ML IJ SOLN
10.0000 mg | INTRAMUSCULAR | Status: DC | PRN
  Administered 2018-06-01: 10 mg via INTRAVENOUS
  Filled 2018-05-31: qty 1

## 2018-05-31 MED ORDER — LIDOCAINE 2% (20 MG/ML) 5 ML SYRINGE
INTRAMUSCULAR | Status: AC
  Filled 2018-05-31: qty 5

## 2018-05-31 SURGICAL SUPPLY — 61 items
ADH SKN CLS APL DERMABOND .7 (GAUZE/BANDAGES/DRESSINGS) ×1
APPLIER CLIP 5 13 M/L LIGAMAX5 (MISCELLANEOUS)
APPLIER CLIP ROT 10 11.4 M/L (STAPLE)
APR CLP MED LRG 11.4X10 (STAPLE)
APR CLP MED LRG 5 ANG JAW (MISCELLANEOUS)
BLADE SURG SZ11 CARB STEEL (BLADE) ×3 IMPLANT
CHLORAPREP W/TINT 26ML (MISCELLANEOUS) ×3 IMPLANT
CLIP APPLIE 5 13 M/L LIGAMAX5 (MISCELLANEOUS) IMPLANT
CLIP APPLIE ROT 10 11.4 M/L (STAPLE) IMPLANT
CLIP VESOLOCK LG 6/CT PURPLE (CLIP) IMPLANT
CLIP VESOLOCK MED LG 6/CT (CLIP) IMPLANT
COVER SURGICAL LIGHT HANDLE (MISCELLANEOUS) ×3 IMPLANT
COVER TIP SHEARS 8 DVNC (MISCELLANEOUS) IMPLANT
COVER TIP SHEARS 8MM DA VINCI (MISCELLANEOUS)
DECANTER SPIKE VIAL GLASS SM (MISCELLANEOUS) ×3 IMPLANT
DERMABOND ADVANCED (GAUZE/BANDAGES/DRESSINGS) ×2
DERMABOND ADVANCED .7 DNX12 (GAUZE/BANDAGES/DRESSINGS) ×1 IMPLANT
DRAIN PENROSE 18X1/2 LTX STRL (DRAIN) ×3 IMPLANT
DRAPE ARM DVNC X/XI (DISPOSABLE) ×4 IMPLANT
DRAPE COLUMN DVNC XI (DISPOSABLE) ×1 IMPLANT
DRAPE DA VINCI XI ARM (DISPOSABLE) ×8
DRAPE DA VINCI XI COLUMN (DISPOSABLE) ×2
ELECT REM PT RETURN 15FT ADLT (MISCELLANEOUS) ×3 IMPLANT
ENDOLOOP SUT PDS II  0 18 (SUTURE)
ENDOLOOP SUT PDS II 0 18 (SUTURE) IMPLANT
GAUZE 4X4 16PLY RFD (DISPOSABLE) ×3 IMPLANT
GLOVE BIO SURGEON STRL SZ7.5 (GLOVE) ×6 IMPLANT
GOWN STRL REUS W/TWL XL LVL3 (GOWN DISPOSABLE) ×8 IMPLANT
HOLDER FOLEY CATH W/STRAP (MISCELLANEOUS) ×2 IMPLANT
IV LACTATED RINGERS 1000ML (IV SOLUTION) ×2 IMPLANT
KIT BASIN OR (CUSTOM PROCEDURE TRAY) ×3 IMPLANT
MARKER SKIN DUAL TIP RULER LAB (MISCELLANEOUS) ×1 IMPLANT
MESH BIO-A 7X10 SYN MAT (Mesh General) ×2 IMPLANT
NDL INSUFFLATION 14GA 120MM (NEEDLE) ×1 IMPLANT
NEEDLE HYPO 22GX1.5 SAFETY (NEEDLE) ×3 IMPLANT
NEEDLE INSUFFLATION 14GA 120MM (NEEDLE) ×3 IMPLANT
OBTURATOR OPTICAL STANDARD 8MM (TROCAR)
OBTURATOR OPTICAL STND 8 DVNC (TROCAR)
OBTURATOR OPTICALSTD 8 DVNC (TROCAR) IMPLANT
PACK CARDIOVASCULAR III (CUSTOM PROCEDURE TRAY) ×3 IMPLANT
PAD POSITIONING PINK XL (MISCELLANEOUS) ×3 IMPLANT
SCISSORS LAP 5X35 DISP (ENDOMECHANICALS) ×3 IMPLANT
SEAL CANN UNIV 5-8 DVNC XI (MISCELLANEOUS) ×4 IMPLANT
SEAL XI 5MM-8MM UNIVERSAL (MISCELLANEOUS) ×8
SEALER VESSEL DA VINCI XI (MISCELLANEOUS) ×2
SEALER VESSEL EXT DVNC XI (MISCELLANEOUS) ×1 IMPLANT
SET IRRIG TUBING LAPAROSCOPIC (IRRIGATION / IRRIGATOR) ×3 IMPLANT
SOLUTION ANTI FOG 6CC (MISCELLANEOUS) ×3 IMPLANT
SOLUTION ELECTROLUBE (MISCELLANEOUS) ×3 IMPLANT
STAPLER VISISTAT 35W (STAPLE) IMPLANT
SUT ETHIBOND 0 36 GRN (SUTURE) ×6 IMPLANT
SUT MNCRL AB 4-0 PS2 18 (SUTURE) ×3 IMPLANT
SUT SILK 0 SH 30 (SUTURE) ×7 IMPLANT
SUT SILK 2 0 SH (SUTURE) ×12 IMPLANT
SYR 20CC LL (SYRINGE) ×3 IMPLANT
TOWEL OR 17X26 10 PK STRL BLUE (TOWEL DISPOSABLE) ×3 IMPLANT
TOWEL OR NON WOVEN STRL DISP B (DISPOSABLE) ×3 IMPLANT
TRAY FOLEY CATH 14FR (SET/KITS/TRAYS/PACK) ×2 IMPLANT
TRAY FOLEY MTR SLVR 16FR STAT (SET/KITS/TRAYS/PACK) IMPLANT
TROCAR ADV FIXATION 5X100MM (TROCAR) ×3 IMPLANT
TUBING INSUFFLATION (TUBING) ×3 IMPLANT

## 2018-05-31 NOTE — Op Note (Signed)
05/31/2018  2:45 PM  PATIENT:  Carla Lewis  72 y.o. female  PRE-OPERATIVE DIAGNOSIS:  hiatal hernia  POST-OPERATIVE DIAGNOSIS:  hiatal hernia  PROCEDURE:  Procedure(s): XI ROBOTIC HIATAL HERNIA REPAIR WITH  TOUPET FUNDOPLICATION ERAS PATHWAY (N/A) INSERTION OF MESH (N/A)  SURGEON:  Surgeon(s) and Role:    * Ralene Ok, MD - Primary    * Clovis Riley, MD - Assisting  ANESTHESIA:   local and general  EBL:  25 mL   BLOOD ADMINISTERED:none  DRAINS: none   LOCAL MEDICATIONS USED:  BUPIVICAINE   SPECIMEN:  No Specimen  DISPOSITION OF SPECIMEN:  N/A  COUNTS:  YES  TOURNIQUET:  * No tourniquets in log *  DICTATION: .Dragon Dictation  The patient was taken back to the operating room and placed in the supine position with bilateral SCDs in place. The patient was prepped and draped in the usual sterile fashion. After appropriate antibiotics were confirmed a timeout was called and all facts were verified.   A Veress needle technique was used to insufflate the abdomen to 15 mm of mercury the paramedian stab incision. Subsequent to this an 8 mm trocar was introduced as was a 8 millimeter camera. At this time the subsequent robotic trochars x3, were then placed adjacent to this trocar approximately 8-10 cm away. Each trocar was inserted under direct visualization, there were total of 4 trochars. The assistant trocar was then placed in the right lower quadrant under direct visualization. The Nathanson retractor was then visualized inserted into the abdomen and the incision just to the left of the falciform ligament. This was then placed to retract the liver appropriately. At this time the patient was positioned in reverse Trendelenburg.   At this time the robot patient cart was brought to the bedside and placed in good position and the arms were docked to the trochars appropriately. At this time I proceeded to incised the gastrohepatic ligament.  At this time I proceeded  to mobilize the stomach inferiorly and visualize the right crus. The peritoneum over the right crus was incised and right crus was identified. I proceeded to dissect this inferiorly until the left crus was seen joining the right crus. Once the right crus was adequately dissected we turned our to the left crus which was dissected away. This required traction of the stomach to the right side. Once this was visualized we then proceeded to circumferentially dissect the hernia sac away from the crus and esophagus away from the surrounding tissue. At this time a Penrose drain was placed around the esophagus to help with retraction. At this time the phrenoesophageal fat pad was dissected away from the esophagus. There was a moderate to large-sized hiatal hernia seen. I mobilized the esophagus cephalad approximately 5-6 cm, clearing away the surrounding tissue. The anterior hernia sac was dissected away from the stomach and esophagus.  At this time we turned our attention to the greater curvature the stomach and the omentum was mobilized using the robotic vessel sealer. This was taken up to the greater curvature to the hiatus. This mobilized the entire greater curvature to allow mobilization and the wrap. I then proceeded to bring the greater curvature the stomach posterior to the esophagus, and a shoeshine technique was used to evaluate the mobilization of the greater curvature.   At this time I proceeded to close the hiatus using figure-of- 8 0 Ethibonds x 3. This brought together the hiatal closure without undue stricture to the esophagus.   A  piece of [employer] A hiatal mesh was placed over the hiatal closure and sutured to the crus using 2-0 silk sutures x 5.  At this time the greater curvature was brought around the esophagus and sutured using 0 silk sutures interrupted fashion approximately 1 cm apart x3 on each side of the esophageal wrap in a Toupet fashion.   A right collar stitch was then used to  gastropexy the stomach from the wrap to the diaphragm just lateral to the right crus.  The wrap lay loose with no strangulation of the esophagus.  At this time the robot was undocked. The liver trocar was removed. At this time insufflation was evacuated. Skin was reapproximated for Monocryl subcuticular fashion. The skin was then dressed with Dermabond. The patient tolerated the procedure well and was taken to the recovery room in stable condition.    PLAN OF CARE: Admit for overnight observation  PATIENT DISPOSITION:  PACU - hemodynamically stable.   Delay start of Pharmacological VTE agent (>24hrs) due to surgical blood loss or risk of bleeding: no

## 2018-05-31 NOTE — H&P (Signed)
History of Present Illness The patient is a 72 year old female who presents with a hiatal hernia. Update: Patient has been doing well since her operation, back surgery, on January 2. She's had relief of her sciatica. Patient states that she continue to dysphasia as well as reflux and dyspepsia. Patient had no other changes in her symptoms as previously discussed.   -------------------------------------- Chief Complaint: Patient is a 72 year old female who was recently seen by Dr. Zella Richer for a large hiatal hernia. Patient has had a long history of reflux, hiatal hernia. She states this and were between 10-20 years. She states she has difficulty in dysphagia with things like meats and breads. She has a sensation of been getting caught in her chest with associated pain. Patient underwent manometry which reveals no achalasia. Patient also had a esophagogram and upper GI which reveals a large hiatal hernia with approximately 1/3-1/2 of her stomach within her left chest cavity. Patient is currently on Protonix. This is helping with her reflux. She states that she does not eat late at night which also helps with the reflux. Patient has had a previous laparoscopic cholecystectomy.   Patient is to have back surgery for spinal stenosis by Dr. Sherwood Gambler in January. She states that she would like to have surgery for her hiatal hernia later in March/April.   Allergies  CODEINE  any strong narcotics makes pt unstable and nauseated SULFUR  adverse effect Bystolic *BETA BLOCKERS*  Sulfa Antibiotics  Allergies Reconciled   Medication History TraMADol HCl (50MG  Tablet, Oral) Active. DiazePAM (5MG  Tablet, Oral) Active. Baclofen (10MG  Tablet, Oral) Active. Montelukast Sodium (10MG  Tablet, Oral) Active. Meloxicam (7.5MG  Tablet, Oral) Active. Benzonatate (100MG  Capsule, Oral) Active. Apple Cider Vinegar Plus (Oral) Specific strength unknown - Active. Aspirin (81MG  Tablet,  Oral) Active. Benefiber (Oral) Active. Cetirizine HCl (10MG  Tablet, Oral) Active. CloNIDine HCl (0.1MG  Tablet, Oral) Active. CoQ10 (100MG  Capsule, Oral) Active. Docusate Sodium (100MG  Capsule, Oral) Active. Move Free (Oral) Specific strength unknown - Active. Lisinopril-Hydrochlorothiazide (20-25MG  Tablet, Oral) Active. Pravastatin Sodium (40MG  Tablet, Oral) Active. Sertraline HCl (25MG  Tablet, Oral) Active. Esomeprazole Magnesium (20MG  Capsule DR, Oral) Active. Fluticasone Propionate (Nasal) (50MCG/ACT Suspension, Nasal) Active. Medications Reconciled    Review of Systems  General Not Present- Appetite Loss, Chills, Fatigue, Fever, Night Sweats, Weight Gain and Weight Loss. Skin Not Present- Change in Wart/Mole, Dryness, Hives, Jaundice, New Lesions, Non-Healing Wounds, Rash and Ulcer. HEENT Present- Wears glasses/contact lenses. Not Present- Earache, Hearing Loss, Hoarseness, Nose Bleed, Oral Ulcers, Ringing in the Ears, Seasonal Allergies, Sinus Pain, Sore Throat, Visual Disturbances and Yellow Eyes. Breast Not Present- Breast Mass, Breast Pain, Nipple Discharge and Skin Changes. Cardiovascular Not Present- Chest Pain, Difficulty Breathing Lying Down, Leg Cramps, Palpitations, Rapid Heart Rate, Shortness of Breath and Swelling of Extremities. Gastrointestinal Not Present- Abdominal Pain, Bloating, Bloody Stool, Change in Bowel Habits, Chronic diarrhea, Constipation, Difficulty Swallowing, Excessive gas, Gets full quickly at meals, Hemorrhoids, Indigestion, Nausea, Rectal Pain and Vomiting. Musculoskeletal Not Present- Back Pain, Joint Pain, Joint Stiffness, Muscle Pain, Muscle Weakness and Swelling of Extremities. Neurological Not Present- Decreased Memory, Fainting, Headaches, Numbness, Seizures, Tingling, Tremor, Trouble walking and Weakness. Psychiatric Not Present- Anxiety, Bipolar, Change in Sleep Pattern, Depression, Fearful and Frequent crying. Endocrine Not  Present- Cold Intolerance, Excessive Hunger, Hair Changes, Heat Intolerance and New Diabetes. Hematology Not Present- Blood Thinners, Easy Bruising, Excessive bleeding, Gland problems, HIV and Persistent Infections. All other systems negative  BP (!) 161/80   Pulse 77   Temp 98 F (36.7  C) (Oral)   Resp 18   Ht 5\' 9"  (1.753 m)   Wt 85.9 kg   SpO2 99%   BMI 27.97 kg/m   Physical Exam The physical exam findings are as follows: Note:Constitutional: No acute distress, conversant, appears stated age  Eyes: Anicteric sclerae, moist conjunctiva, no lid lag  Neck: No thyromegaly, trachea midline, no cervical lymphadenopathy  Lungs: Clear to auscultation biilaterally, normal respiratory effot  Cardiovascular: regular rate & rhythm, no murmurs, no peripheal edema, pedal pulses 2+  GI: Soft, no masses or hepatosplenomegaly, non-tender to palpation  MSK: Normal gait, no clubbing cyanosis, edema  Skin: No rashes, palpation reveals normal skin turgor  Psychiatric: Appropriate judgment and insight, oriented to person, place, and time    Assessment & Plan  HIATAL HERNIA (K44.9) Impression: 72 year old female with a large hiatal hernia. 1. Will proceed to the operating room for a robotic hiatal hernia repair with mesh and Nissen fundoplication  2. Discussed with patient the risks and benefits of the procedure to include but not limited to: Infection, bleeding, damage to structures, possible pneumothorax, possible recurrence. The patient voiced understanding and wishes to proceed.

## 2018-05-31 NOTE — Anesthesia Procedure Notes (Signed)
Procedure Name: Intubation Date/Time: 05/31/2018 12:39 PM Performed by: Sharlette Dense, CRNA Patient Re-evaluated:Patient Re-evaluated prior to induction Oxygen Delivery Method: Circle system utilized Preoxygenation: Pre-oxygenation with 100% oxygen Induction Type: IV induction Ventilation: Mask ventilation without difficulty and Oral airway inserted - appropriate to patient size Laryngoscope Size: Sabra Heck and 2 Grade View: Grade I Tube type: Oral Tube size: 7.5 mm Number of attempts: 1 Airway Equipment and Method: Stylet Placement Confirmation: ETT inserted through vocal cords under direct vision,  positive ETCO2 and breath sounds checked- equal and bilateral Secured at: 21 cm Tube secured with: Tape Dental Injury: Teeth and Oropharynx as per pre-operative assessment

## 2018-05-31 NOTE — Anesthesia Postprocedure Evaluation (Signed)
Anesthesia Post Note  Patient: Carla Lewis  Procedure(s) Performed: XI ROBOTIC HIATAL HERNIA REPAIR WITH  NISSEN FUNDOPLICATION ERAS PATHWAY (N/A ) INSERTION OF MESH (N/A )     Patient location during evaluation: PACU Anesthesia Type: General Level of consciousness: awake and alert Pain management: pain level controlled Vital Signs Assessment: post-procedure vital signs reviewed and stable Respiratory status: spontaneous breathing, nonlabored ventilation, respiratory function stable and patient connected to nasal cannula oxygen Cardiovascular status: blood pressure returned to baseline and stable Postop Assessment: no apparent nausea or vomiting Anesthetic complications: no    Last Vitals:  Vitals:   05/31/18 1923 05/31/18 1945  BP: (!) 157/78 (!) 164/77  Pulse: 76 80  Resp: 17 16  Temp: 36.7 C 36.5 C  SpO2: 98% 95%    Last Pain:  Vitals:   05/31/18 1945  TempSrc: Oral  PainSc:                  Ryan P Ellender

## 2018-05-31 NOTE — Transfer of Care (Signed)
Immediate Anesthesia Transfer of Care Note  Patient: Carla Lewis  Procedure(s) Performed: XI ROBOTIC HIATAL HERNIA REPAIR WITH  NISSEN FUNDOPLICATION ERAS PATHWAY (N/A ) INSERTION OF MESH (N/A )  Patient Location: PACU  Anesthesia Type:General  Level of Consciousness: awake  Airway & Oxygen Therapy: Patient Spontanous Breathing and Patient connected to face mask oxygen  Post-op Assessment: Report given to RN and Post -op Vital signs reviewed and stable  Post vital signs: Reviewed and stable  Last Vitals:  Vitals Value Taken Time  BP 168/89 05/31/2018  3:00 PM  Temp    Pulse 93 05/31/2018  3:00 PM  Resp 22 05/31/2018  3:00 PM  SpO2 97 % 05/31/2018  3:00 PM  Vitals shown include unvalidated device data.  Last Pain:  Vitals:   05/31/18 1105  TempSrc: Oral         Complications: No apparent anesthesia complications

## 2018-05-31 NOTE — Anesthesia Preprocedure Evaluation (Addendum)
Anesthesia Evaluation  Patient identified by MRN, date of birth, ID band Patient awake    Reviewed: Allergy & Precautions, NPO status , Patient's Chart, lab work & pertinent test results  History of Anesthesia Complications (+) PONV and history of anesthetic complications  Airway Mallampati: II  TM Distance: >3 FB Neck ROM: Full    Dental no notable dental hx.    Pulmonary neg pulmonary ROS,    Pulmonary exam normal breath sounds clear to auscultation       Cardiovascular hypertension, Pt. on medications Normal cardiovascular exam+ Valvular Problems/Murmurs MR  Rhythm:Regular Rate:Normal  ECG: Sinus rhythm with Premature supraventricular complexes and with occasional and consecutive Premature ventricular complexes   Neuro/Psych Anxiety negative neurological ROS     GI/Hepatic Neg liver ROS, hiatal hernia, GERD  Medicated and Controlled,  Endo/Other  negative endocrine ROS  Renal/GU negative Renal ROS     Musculoskeletal negative musculoskeletal ROS (+)   Abdominal   Peds  Hematology HLD   Anesthesia Other Findings hiatal hernia  Reproductive/Obstetrics                            Anesthesia Physical Anesthesia Plan  ASA: III  Anesthesia Plan: General   Post-op Pain Management:    Induction: Intravenous  PONV Risk Score and Plan: 4 or greater and Midazolam, Dexamethasone, Ondansetron, Treatment may vary due to age or medical condition and Scopolamine patch - Pre-op  Airway Management Planned: Oral ETT  Additional Equipment:   Intra-op Plan:   Post-operative Plan: Extubation in OR  Informed Consent: I have reviewed the patients History and Physical, chart, labs and discussed the procedure including the risks, benefits and alternatives for the proposed anesthesia with the patient or authorized representative who has indicated his/her understanding and acceptance.   Dental  advisory given  Plan Discussed with: CRNA  Anesthesia Plan Comments:        Anesthesia Quick Evaluation

## 2018-06-01 ENCOUNTER — Observation Stay (HOSPITAL_COMMUNITY): Payer: Medicare Other

## 2018-06-01 ENCOUNTER — Encounter (HOSPITAL_COMMUNITY): Payer: Self-pay | Admitting: General Surgery

## 2018-06-01 DIAGNOSIS — Z791 Long term (current) use of non-steroidal anti-inflammatories (NSAID): Secondary | ICD-10-CM | POA: Diagnosis not present

## 2018-06-01 DIAGNOSIS — Z7951 Long term (current) use of inhaled steroids: Secondary | ICD-10-CM | POA: Diagnosis not present

## 2018-06-01 DIAGNOSIS — I10 Essential (primary) hypertension: Secondary | ICD-10-CM | POA: Diagnosis not present

## 2018-06-01 DIAGNOSIS — K219 Gastro-esophageal reflux disease without esophagitis: Secondary | ICD-10-CM | POA: Diagnosis not present

## 2018-06-01 DIAGNOSIS — Z79899 Other long term (current) drug therapy: Secondary | ICD-10-CM | POA: Diagnosis not present

## 2018-06-01 DIAGNOSIS — K449 Diaphragmatic hernia without obstruction or gangrene: Secondary | ICD-10-CM | POA: Diagnosis not present

## 2018-06-01 LAB — BASIC METABOLIC PANEL
ANION GAP: 8 (ref 5–15)
BUN: 13 mg/dL (ref 8–23)
CALCIUM: 8.5 mg/dL — AB (ref 8.9–10.3)
CO2: 23 mmol/L (ref 22–32)
CREATININE: 0.6 mg/dL (ref 0.44–1.00)
Chloride: 97 mmol/L — ABNORMAL LOW (ref 98–111)
Glucose, Bld: 161 mg/dL — ABNORMAL HIGH (ref 70–99)
Potassium: 3.8 mmol/L (ref 3.5–5.1)
Sodium: 128 mmol/L — ABNORMAL LOW (ref 135–145)

## 2018-06-01 NOTE — Plan of Care (Signed)
Nutrition Education Note  RD consulted for nutrition education regarding patient who is s/p nissen fundoplication. Pt to follow a liquid diet for 2 weeks per surgery.  RD provided handout on Nissen Fundoplication nutrition therapy. Reviewed clear and full liquids. Encouraged pt to avoid caffeine, carbonated beverages and use of straws.  Provided examples of appropriate foods on a soft diet. Reviewed gas producing foods. Discouraged intake of processed foods and red meat. Recommended use of a liquid multivitamin while following a liquid diet. Reviewed acceptable protein supplements.  RD discussed why it is important for patient to adhere to diet recommendations. Teach back method used.  Expect good compliance. Pt with good questions and family members present for education.  Body mass index is 27.97 kg/m.  Pt meets criteria for overweight based on current BMI.  Current diet order is NPO. Labs and medications reviewed. No further nutrition interventions warranted at this time. If additional nutrition issues arise, please re-consult RD.   Clayton Bibles, MS, RD, [place] Dietitian Pager: [phone] After Hours Pager: [phone]

## 2018-06-01 NOTE — Progress Notes (Signed)
Patient was asking about her Zoloft pill and BP medication that she regularly takes home. MD Marlou Starks was paged about medication, he said to wait till patient's MD comes in the morning. He gave RN an order for lopressor IV for patient's BP that was 168/82, has hydralazine IV PRN if SBP>180. RN tried to put order for lopressor  IV but it was contraindicated because patient is allergic to Bystolic (Nebivolol), RN notified MD Marlou Starks, he said to give a one time dose of hyrdralazine IV.

## 2018-06-01 NOTE — Progress Notes (Signed)
1 Day Post-Op   Subjective/Chief Complaint: Pt doing well with some nausea overnight No abd pain  Objective: Vital signs in last 24 hours: Temp:  [97.6 F (36.4 C)-98.6 F (37 C)] 98.6 F (37 C) (08/14 0605) Pulse Rate:  [69-97] 69 (08/14 0605) Resp:  [15-23] 17 (08/14 0605) BP: (145-178)/(73-94) 145/73 (08/14 0605) SpO2:  [91 %-99 %] 92 % (08/14 0622) Weight:  [85.9 kg] 85.9 kg (08/13 1112) Last BM Date: 05/31/18  Intake/Output from previous day: 08/13 0701 - 08/14 0700 In: 2630.2 [I.V.:2330.2; IV Piggyback:300] Out: 7218 [Urine:1800; Blood:25] Intake/Output this shift: No intake/output data recorded.  General appearance: alert and cooperative GI: soft, non-tender; bowel sounds normal; no masses,  no organomegaly  Lab Results:  No results for input(s): WBC, HGB, HCT, PLT in the last 72 hours. BMET Recent Labs    06/01/18 0500  NA 128*  K 3.8  CL 97*  CO2 23  GLUCOSE 161*  BUN 13  CREATININE 0.60  CALCIUM 8.5*   PT/INR No results for input(s): LABPROT, INR in the last 72 hours. ABG No results for input(s): PHART, HCO3 in the last 72 hours.  Invalid input(s): PCO2, PO2  Studies/Results: No results found.  Anti-infectives: Anti-infectives (From admission, onward)   Start     Dose/Rate Route Frequency Ordered Stop   05/31/18 1145  ceFAZolin (ANCEF) IVPB 2g/100 mL premix     2 g 200 mL/hr over 30 Minutes Intravenous On call to O.R. 05/31/18 1136 05/31/18 1305      Assessment/Plan: s/p Procedure(s): XI ROBOTIC HIATAL HERNIA REPAIR WITH  NISSEN FUNDOPLICATION ERAS PATHWAY (N/A) INSERTION OF MESH (N/A) Await esophagram today.  If OK then will start CLD diet Mobilize hopefully home tomorrow.  LOS: 1 day    Rosario Jacks., [facility] 06/01/2018

## 2018-06-02 DIAGNOSIS — E785 Hyperlipidemia, unspecified: Secondary | ICD-10-CM | POA: Diagnosis not present

## 2018-06-02 DIAGNOSIS — K449 Diaphragmatic hernia without obstruction or gangrene: Secondary | ICD-10-CM | POA: Diagnosis not present

## 2018-06-02 DIAGNOSIS — I10 Essential (primary) hypertension: Secondary | ICD-10-CM | POA: Diagnosis not present

## 2018-06-02 DIAGNOSIS — Z7951 Long term (current) use of inhaled steroids: Secondary | ICD-10-CM | POA: Diagnosis not present

## 2018-06-02 DIAGNOSIS — Z791 Long term (current) use of non-steroidal anti-inflammatories (NSAID): Secondary | ICD-10-CM | POA: Diagnosis not present

## 2018-06-02 DIAGNOSIS — K219 Gastro-esophageal reflux disease without esophagitis: Secondary | ICD-10-CM | POA: Diagnosis not present

## 2018-06-02 DIAGNOSIS — Z79899 Other long term (current) drug therapy: Secondary | ICD-10-CM | POA: Diagnosis not present

## 2018-06-02 DIAGNOSIS — F419 Anxiety disorder, unspecified: Secondary | ICD-10-CM | POA: Diagnosis not present

## 2018-06-02 LAB — TROPONIN I: Troponin I: 0.04 ng/mL (ref <0.03)

## 2018-06-02 MED ORDER — ACETAMINOPHEN 160 MG/5ML PO SOLN
500.0000 mg | Freq: Four times a day (QID) | ORAL | Status: DC | PRN
  Administered 2018-06-02: 500 mg via ORAL
  Filled 2018-06-02: qty 20.3

## 2018-06-02 MED ORDER — ONDANSETRON 4 MG PO TBDP: 4.0000 mg | ORAL_TABLET | Freq: Four times a day (QID) | ORAL | 0 refills | Status: DC | PRN

## 2018-06-02 MED ORDER — ONDANSETRON 4 MG PO TBDP: 4.0000 mg | ORAL_TABLET | Freq: Three times a day (TID) | ORAL | 0 refills | Status: DC | PRN

## 2018-06-02 MED ORDER — ACETAMINOPHEN 160 MG/5ML PO SOLN: 500.0000 mg | Freq: Four times a day (QID) | ORAL | 0 refills | Status: DC | PRN

## 2018-06-02 MED ORDER — PROMETHAZINE HCL 25 MG/ML IJ SOLN
6.2500 mg | Freq: Once | INTRAMUSCULAR | Status: DC
  Filled 2018-06-02: qty 1

## 2018-06-02 NOTE — Discharge Instructions (Signed)
EATING AFTER YOUR ESOPHAGEAL SURGERY (Stomach Fundoplication, Hiatal Hernia repair, Achalasia surgery, etc)  ######################################################################  EAT Start with a pureed / full liquid diet (see below) Gradually transition to a high fiber diet with a fiber supplement over the next month after discharge.    WALK Walk an hour a day.  Control your pain to do that.    CONTROL PAIN Control pain so that you can walk, sleep, tolerate sneezing/coughing, go up/down stairs.  HAVE A BOWEL MOVEMENT DAILY Keep your bowels regular to avoid problems.  OK to try a laxative to override constipation.  OK to use an antidairrheal to slow down diarrhea.  Call if not better after 2 tries  CALL IF YOU HAVE PROBLEMS/CONCERNS Call if you are still struggling despite following these instructions. Call if you have concerns not answered by these instructions  ######################################################################   After your esophageal surgery, expect some sticking with swallowing over the next 1-2 months.    If food sticks when you eat, it is called "dysphagia".  This is due to swelling around your esophagus at the wrap & hiatal diaphragm repair.  It will gradually ease off over the next few months.  To help you through this temporary phase, we start you out on a pureed (blenderized) diet.  Your first meal in the hospital was thin liquids.  You should have been given a pureed diet by the time you left the hospital.  We ask patients to stay on a pureed diet for the first 2-3 weeks to avoid anything getting "stuck" near your recent surgery.  Don't be alarmed if your ability to swallow doesn't progress according to this plan.  Everyone is different and some diets can advance more or less quickly.     Some BASIC RULES to follow are:  Maintain an upright position whenever eating or drinking.  Take small bites - just a teaspoon size bite at a time.  Eat slowly.   It may also help to eat only one food at a time.  Consider nibbling through smaller, more frequent meals & avoid the urge to eat BIG meals  Do not push through feelings of fullness, nausea, or bloatedness  Do not mix solid foods and liquids in the same mouthful  Try not to "wash foods down" with large gulps of liquids.  Avoid carbonated (bubbly/fizzy) drinks.    Avoid foods that make you feel gassy or bloated.  Start with bland foods first.  Wait on trying greasy, fried, or spicy meals until you are tolerating more bland solids well.  Understand that it will be hard to burp and belch at first.  This gradually improves with time.  Expect to be more gassy/flatulent/bloated initially.  Walking will help your body manage it better.  Consider using medications for bloating that contain simethicone such as  Maalox or Gas-X   Eat in a relaxed atmosphere & minimize distractions.  Avoid talking while eating.    Do not use straws.  Following each meal, sit in an upright position (90 degree angle) for 60 to 90 minutes.  Going for a short walk can help as well  If food does stick, don't panic.  Try to relax and let the food pass on its own.  Sipping WARM LIQUID such as strong hot black tea can also help slide it down.   Be gradual in changes & use common sense:  -If you easily tolerating a certain "level" of foods, advance to the next level gradually -If you are  having trouble swallowing a particular food, then avoid it.   -If food is sticking when you advance your diet, go back to thinner previous diet (the lower LEVEL) for 1-2 days.  LEVEL 1 = PUREED DIET  Do for the first 2 WEEKS AFTER SURGERY  -Foods in this group are pureed or blenderized to a smooth, mashed potato-like consistency.  -If necessary, the pureed foods can keep their shape with the addition of a thickening agent.   -Meat should be pureed to a smooth, pasty consistency.  Hot broth or gravy may be added to the pureed  meat, approximately 1 oz. of liquid per 3 oz. serving of meat. -CAUTION:  If any foods do not puree into a smooth consistency, swallowing will be more difficult.  (For example, nuts or seeds sometimes do not blend well.)  Hot Foods Cold Foods  Pureed scrambled eggs and cheese Pureed cottage cheese  Baby cereals Thickened juices and nectars  Thinned cooked cereals (no lumps) Thickened milk or eggnog  Pureed [place] toast or pancakes Ensure  Mashed potatoes Ice cream  Pureed parsley, au gratin, scalloped potatoes, candied sweet potatoes Fruit or [place] ice, sherbet  Pureed buttered or alfredo noodles Plain yogurt  Pureed vegetables (no corn or peas) Instant breakfast  Pureed soups and creamed soups Smooth pudding, mousse, custard  Pureed scalloped apples Whipped gelatin  Gravies Sugar, syrup, honey, jelly  Sauces, cheese, tomato, barbecue, white, creamed Cream  Any baby food Creamer  Alcohol in moderation (not beer or champagne) Margarine  Coffee or tea Mayonnaise   Ketchup, mustard   Apple sauce   SAMPLE MENU:  PUREED DIET Breakfast Lunch Dinner   Orange juice, 1/2 cup  Cream of wheat, 1/2 cup  Pineapple juice, 1/2 cup  Pureed [place], barley soup, 3/4 cup  Pureed Hawaiian chicken, 3 oz   Scrambled eggs, mashed or blended with cheese, 1/2 cup  Tea or coffee, 1 cup   Whole milk, 1 cup   Non-dairy creamer, 2 Tbsp.  Mashed potatoes, 1/2 cup  Pureed cooled broccoli, 1/2 cup  Apple sauce, 1/2 cup  Coffee or tea  Mashed potatoes, 1/2 cup  Pureed spinach, 1/2 cup  Frozen yogurt, 1/2 cup  Tea or coffee      LEVEL 2 = SOFT DIET  After your first 2 weeks, you can advance to a soft diet.   Keep on this diet until everything goes down easily.  Hot Foods Cold Foods  White fish Cottage cheese  Stuffed fish Junior baby fruit  Baby food meals Semi thickened juices  Minced soft cooked, scrambled, poached eggs nectars  Souffle & omelets Ripe mashed bananas  Cooked  cereals Canned fruit, pineapple sauce, milk  potatoes Milkshake  Buttered or Alfredo noodles Custard  Cooked cooled vegetable Puddings, including tapioca  Sherbet Yogurt  Vegetable soup or alphabet soup Fruit ice, [place] ice  Gravies Whipped gelatin  Sugar, syrup, honey, jelly Junior baby desserts  Sauces:  Cheese, creamed, barbecue, tomato, white Cream  Coffee or tea Margarine   SAMPLE MENU:  LEVEL 2 Breakfast Lunch Dinner   Orange juice, 1/2 cup  Oatmeal, 1/2 cup  Scrambled eggs with cheese, 1/2 cup  Decaffeinated tea, 1 cup  Whole milk, 1 cup  Non-dairy creamer, 2 Tbsp  Pineapple juice, 1/2 cup  Minced beef, 3 oz  Gravy, 2 Tbsp  Mashed potatoes, 1/2 cup  Minced fresh broccoli, 1/2 cup  Applesauce, 1/2 cup  Coffee, 1 cup  [place], barley soup, 3/4 cup  Minced Hawaiian chicken, 3 oz  Mashed potatoes, 1/2 cup  Cooked spinach, 1/2 cup  Frozen yogurt, 1/2 cup  Non-dairy creamer, 2 Tbsp      LEVEL 3 = CHOPPED DIET  -After all the foods in level 2 (soft diet) are passing through well you should advance up to more chopped foods.  -It is still important to cut these foods into small pieces and eat slowly.  Hot Foods Cold Foods  Poultry Cottage cheese  Chopped Swedish meatballs Yogurt  Meat salads (ground or flaked meat) Milk  Flaked fish (tuna) Milkshakes  Poached or scrambled eggs Soft, cold, dry cereal  Souffles and omelets Fruit juices or nectars  Cooked cereals Chopped canned fruit  Chopped [place] toast or pancakes Canned fruit cocktail  Noodles or pasta (no rice) Pudding, mousse, custard  Cooked vegetables (no frozen peas, corn, or mixed vegetables) Green salad  Canned small sweet peas Ice cream  Creamed soup or vegetable soup Fruit ice, [place] ice  Pureed vegetable soup or alphabet soup Non-dairy creamer  Ground scalloped apples Margarine  Gravies Mayonnaise  Sauces:  Cheese, creamed, barbecue, tomato, white Ketchup  Coffee or tea Mustard    SAMPLE MENU:  LEVEL 3 Breakfast Lunch Dinner   Orange juice, 1/2 cup  Oatmeal, 1/2 cup  Scrambled eggs with cheese, 1/2 cup  Decaffeinated tea, 1 cup  Whole milk, 1 cup  Non-dairy creamer, 2 Tbsp  Ketchup, 1 Tbsp  Margarine, 1 tsp  Salt, 1/4 tsp  Sugar, 2 tsp  Pineapple juice, 1/2 cup  Ground beef, 3 oz  Gravy, 2 Tbsp  Mashed potatoes, 1/2 cup  Cooked spinach, 1/2 cup  Applesauce, 1/2 cup  Decaffeinated coffee  Whole milk  Non-dairy creamer, 2 Tbsp  Margarine, 1 tsp  Salt, 1/4 tsp  Pureed [place], barley soup, 3/4 cup  Barbecue chicken, 3 oz  Mashed potatoes, 1/2 cup  Ground fresh broccoli, 1/2 cup  Frozen yogurt, 1/2 cup  Decaffeinated tea, 1 cup  Non-dairy creamer, 2 Tbsp  Margarine, 1 tsp  Salt, 1/4 tsp  Sugar, 1 tsp    LEVEL 4:  REGULAR FOODS  -Foods in this group are soft, moist, regularly textured foods.   -This level includes meat and breads, which tend to be the hardest things to swallow.   -Eat very slowly, chew well and continue to avoid carbonated drinks. -most people are at this level in 4-6 weeks  Hot Foods Cold Foods  Baked fish or skinned Soft cheeses - cottage cheese  Souffles and omelets Cream cheese  Eggs Yogurt  Stuffed shells Milk  Spaghetti with meat sauce Milkshakes  Cooked cereal Cold dry cereals (no nuts, dried fruit, coconut)  [place] toast or pancakes Crackers  Buttered toast Fruit juices or nectars  Noodles or pasta (no rice) Canned fruit  Potatoes (all types) Ripe bananas  Soft, cooked vegetables (no corn, lima, or baked beans) Peeled, ripe, fresh fruit  Creamed soups or vegetable soup Cakes (no nuts, dried fruit, coconut)  Canned chicken noodle soup Plain doughnuts  Gravies Ice cream  Bacon dressing Pudding, mousse, custard  Sauces:  Cheese, creamed, barbecue, tomato, white Fruit ice, [place] ice, sherbet  Decaffeinated tea or coffee Whipped gelatin  Pork chops Regular gelatin   Canned fruited  gelatin molds   Sugar, syrup, honey, jam, jelly   Cream   Non-dairy   Margarine   Oil   Mayonnaise   Ketchup   Mustard   TROUBLESHOOTING IRREGULAR BOWELS  1) Avoid extremes of bowel  movements (no bad constipation/diarrhea)  °2) Miralax 17gm mixed in 8oz. water or juice-daily. May use BID as needed.  °3) Gas-x,Phazyme, etc. as needed for gas & bloating.  °4) Soft,bland diet. No spicy,greasy,fried foods.  °5) Prilosec over-the-counter as needed  °6) May hold gluten/wheat products from diet to see if symptoms improve.  °7) May try probiotics (Align, Activa, etc) to help calm the bowels down  °7) If symptoms become worse call back immediately. ° ° ° °If you have any questions please call our office at [facility]: [phone]. ° °

## 2018-06-02 NOTE — Progress Notes (Signed)
MD Marlou Starks was notified that patient's Troponin was 0.04 and her EKG was NSR with nonspecific ST abnormality. MD Marlou Starks said there was nothing to do and no new orders were given.

## 2018-06-02 NOTE — Discharge Summary (Signed)
Physician Discharge Summary  Patient ID: Carla Lewis MRN: [id number] DOB/AGE: 01-16-1946 72 y.o.  Admit date: 05/31/2018 Discharge date: 06/02/2018  Admission Diagnoses: hiatal hernia  Discharge Diagnoses:  Active Problems:   S/P Nissen fundoplication (without gastrostomy tube) procedure   Discharged Condition: good  Hospital Course: Pt was admitted post op.  Please see op note for full details.  Pt was ambulating well on her own.  She was tol Liquids as tol.  Pt underwent esophagram without any issues.  Pt had good pain control.  She was afrebrile, and deemed stable for DC and Dc'd home  Consults: None  Significant Diagnostic Studies: EXAM: WATER-SOLUBLE UPPER GI  TECHNIQUE: Single contrast examination was performed using water-soluble contrast.  FLUOROSCOPY TIME:  Fluoroscopy Time:  1 minutes and 0 seconds  Radiation Exposure Index (if provided by the fluoroscopic device): 17.9 mGy  Number of Acquired Spot Images: 0  COMPARISON:  None.  FINDINGS: Mild esophageal stasis is noted. Contrast did pass through the fundoplication which appeared fairly tight but this may be due to postoperative edema/inflammation. The visualized stomach is unremarkable.  No complicating features such as un wrapping or leak.  IMPRESSION: Expected postoperative appearance of the distal esophagus/Nissen fundoplication. No findings for leakage of contrast material or unwrapping.  Treatments: surgery: as above  Discharge Exam: Blood pressure (!) 160/84, pulse 78, temperature 97.7 F (36.5 C), temperature source Oral, resp. rate 20, height 5\' 9"  (1.753 m), weight 85.9 kg, SpO2 95 %. General appearance: alert and cooperative GI: soft, non-tender; bowel sounds normal; no masses,  no organomegaly  Disposition: Discharge disposition: 01-Home or Self Care       Allergies as of 06/02/2018      Reactions   Darvon [propoxyphene] Anaphylaxis   Codeine    UNSPECIFIED  REACTION    Sulfa Drugs Cross Reactors    UNSPECIFIED REACTION    Bystolic [nebivolol Hcl] Other (See Comments)   No energy and lightheadedness.   Diovan [valsartan] Anxiety      Medication List    TAKE these medications   acetaminophen 500 MG tablet Commonly known as:  TYLENOL Take 500-1,000 mg by mouth every 8 (eight) hours as needed for mild pain or moderate pain. What changed:  Another medication with the same name was added. Make sure you understand how and when to take each.   acetaminophen 160 MG/5ML solution Commonly known as:  TYLENOL Take 15.6 mLs (500 mg total) by mouth every 6 (six) hours as needed for mild pain. What changed:  You were already taking a medication with the same name, and this prescription was added. Make sure you understand how and when to take each.   BENEFIBER PO Take 10 mLs by mouth 2 (two) times daily. Take 2 teaspoonfuls by mouth twice daily   cetirizine 10 MG tablet Commonly known as:  ZYRTEC Take 10 mg by mouth daily.   docusate sodium 100 MG capsule Commonly known as:  COLACE Take 200 mg by mouth daily.   esomeprazole 20 MG capsule Commonly known as:  NEXIUM Take 20 mg by mouth every morning.   fluticasone 50 MCG/ACT nasal spray Commonly known as:  FLONASE Place 2 sprays into both nostrils daily as needed for allergies. After sinus rinses   lisinopril-hydrochlorothiazide 20-25 MG tablet Commonly known as:  PRINZIDE,ZESTORETIC Take 1 tablet by mouth daily.   montelukast 10 MG tablet Commonly known as:  SINGULAIR Take 1 tablet (10 mg total) by mouth at bedtime. What changed:  how much  to take   MOVE FREE PO Take 1 capsule by mouth daily.   ondansetron 4 MG disintegrating tablet Commonly known as:  ZOFRAN-ODT Take 1 tablet (4 mg total) by mouth every 8 (eight) hours as needed for nausea or vomiting.   ondansetron 4 MG disintegrating tablet Commonly known as:  ZOFRAN-ODT Take 1 tablet (4 mg total) by mouth every 6 (six) hours  as needed for nausea.   pravastatin 20 MG tablet Commonly known as:  PRAVACHOL Take 20 mg by mouth at bedtime.   sertraline 25 MG tablet Commonly known as:  ZOLOFT Take 1 tablet (25 mg total) by mouth daily.   Vitamin D (Ergocalciferol) 50000 units Caps capsule Commonly known as:  DRISDOL Take 1 capsule (50,000 Units total) by mouth every 7 (seven) days.   Vitamin D 2000 units tablet Take 2,000 Units by mouth daily.         Discharge Instructions    Diet - low sodium heart healthy   Complete by:  As directed    Increase activity slowly   Complete by:  As directed          Follow-up Information    Ralene Ok, MD. Schedule an appointment as soon as possible for a visit in 2 week(s).   Specialty:  General Surgery Contact information: [address] [place] [address] [phone]           Signed: Rosario Jacks., Anne Hahn 06/02/2018, 8:36 AM

## 2018-06-02 NOTE — Care Management Obs Status (Signed)
[place] NOTIFICATION   Patient Details  Name: Carla Lewis MRN: [id number] Date of Birth: 1946-03-05   Medicare Observation Status Notification Given:       Guadalupe Maple, RN 06/02/2018, 9:51 AM

## 2018-06-02 NOTE — Progress Notes (Addendum)
CRITICAL VALUE ALERT  Critical Value: Troponin 0.04  Date & Time Notied: 06/02/18 0213  Provider Notified: Toth  Orders Received/Actions taken: No new orders were given

## 2018-06-02 NOTE — Progress Notes (Signed)
Discharge instructions reviewed with patient. All questions answered. Patient wheeled to vehicle with belongings by nurse tech. 

## 2018-06-02 NOTE — Progress Notes (Signed)
Patient said that her chest felt tight and she was asking for pain medicine, she has IV Dilaudid PRN but she doesn't want to take narcotics. VS were stable, BP is slightly elevated 154/68. MD Marlou Starks was paged. EKG was done showed NSR. Troponin was drawn, still waiting on results. Patient seems anxious, RN but some drops of lavender on a cotton ball. Will continue to monitor patient.

## 2018-06-11 DIAGNOSIS — N3001 Acute cystitis with hematuria: Secondary | ICD-10-CM | POA: Diagnosis not present

## 2018-06-11 DIAGNOSIS — N3090 Cystitis, unspecified without hematuria: Secondary | ICD-10-CM | POA: Diagnosis not present

## 2018-07-14 ENCOUNTER — Ambulatory Visit
Admission: RE | Admit: 2018-07-14 | Discharge: 2018-07-14 | Disposition: A | Discharge status: Home / Self Care | Payer: Medicare Other | Source: Ambulatory Visit | Attending: Family Medicine | Admitting: Family Medicine

## 2018-07-14 ENCOUNTER — Other Ambulatory Visit: Payer: Self-pay | Admitting: Family Medicine

## 2018-07-14 DIAGNOSIS — Z1231 Encounter for screening mammogram for malignant neoplasm of breast: Secondary | ICD-10-CM

## 2018-07-14 DIAGNOSIS — E2839 Other primary ovarian failure: Secondary | ICD-10-CM

## 2018-07-14 DIAGNOSIS — Z780 Asymptomatic menopausal state: Secondary | ICD-10-CM | POA: Diagnosis not present

## 2018-07-14 DIAGNOSIS — M810 Age-related osteoporosis without current pathological fracture: Secondary | ICD-10-CM | POA: Diagnosis not present

## 2018-07-14 DIAGNOSIS — M85852 Other specified disorders of bone density and structure, left thigh: Secondary | ICD-10-CM | POA: Diagnosis not present

## 2018-07-26 DIAGNOSIS — M5136 Other intervertebral disc degeneration, lumbar region: Secondary | ICD-10-CM | POA: Diagnosis not present

## 2018-07-26 DIAGNOSIS — S32009K Unspecified fracture of unspecified lumbar vertebra, subsequent encounter for fracture with nonunion: Secondary | ICD-10-CM | POA: Diagnosis not present

## 2018-07-26 DIAGNOSIS — M4155 Other secondary scoliosis, thoracolumbar region: Secondary | ICD-10-CM | POA: Diagnosis not present

## 2018-07-26 DIAGNOSIS — I10 Essential (primary) hypertension: Secondary | ICD-10-CM | POA: Diagnosis not present

## 2018-07-26 DIAGNOSIS — Z6827 Body mass index (BMI) 27.0-27.9, adult: Secondary | ICD-10-CM | POA: Diagnosis not present

## 2018-07-26 DIAGNOSIS — M47816 Spondylosis without myelopathy or radiculopathy, lumbar region: Secondary | ICD-10-CM | POA: Diagnosis not present

## 2018-07-26 DIAGNOSIS — M4316 Spondylolisthesis, lumbar region: Secondary | ICD-10-CM | POA: Diagnosis not present

## 2018-07-29 ENCOUNTER — Ambulatory Visit (INDEPENDENT_AMBULATORY_CARE_PROVIDER_SITE_OTHER): Payer: Medicare Other | Admitting: Family Medicine

## 2018-07-29 ENCOUNTER — Encounter: Payer: Self-pay | Admitting: Family Medicine

## 2018-07-29 VITALS — BP 125/73 | HR 57 | Ht 69.0 in | Wt 181.7 lb

## 2018-07-29 DIAGNOSIS — M810 Age-related osteoporosis without current pathological fracture: Secondary | ICD-10-CM

## 2018-07-29 DIAGNOSIS — E871 Hypo-osmolality and hyponatremia: Secondary | ICD-10-CM | POA: Diagnosis not present

## 2018-07-29 DIAGNOSIS — E559 Vitamin D deficiency, unspecified: Secondary | ICD-10-CM

## 2018-07-29 DIAGNOSIS — J3089 Other allergic rhinitis: Secondary | ICD-10-CM

## 2018-07-29 DIAGNOSIS — Z9889 Other specified postprocedural states: Secondary | ICD-10-CM

## 2018-07-29 MED ORDER — ALENDRONATE SODIUM 35 MG PO TABS: 35.0000 mg | ORAL_TABLET | ORAL | 7 refills | Status: DC

## 2018-07-29 MED ORDER — MONTELUKAST SODIUM 10 MG PO TABS: 10.0000 mg | ORAL_TABLET | Freq: Every day | ORAL | 3 refills | Status: DC

## 2018-07-29 NOTE — Progress Notes (Signed)
Impression and Recommendations:    1. Age related osteoporosis, unspecified pathological fracture presence   2. Environmental and seasonal allergies   3. h/o Hyponatremia- 128 on 8/19   4. Vitamin D insufficiency   5. S/P Nissen fundoplication (without gastrostomy tube) procedure     1. Recent DEXA Scan - Age Related Osteoporosis - Discussed that findings reveal that patient has osteoporosis. - Reviewed results of recent DEXA scan with patient today (07/14/2018): - optimize calcium and vitamin D intake. - Start Fosamax-  patient opted to go with the 35 mg weekly (not my recommended 70 mg weekly )  which she understands is for prevention not for treatment of osteoporosis.  She states that she may consider going up on the medicine after she reads about it and if tolerated well after a couple of months or so. - Repeat DEXA scan 2 years  - Calcium Supplementation - Educated patient on importance of continued Vitamin D & Calcium supplementation.  Reviewed that 1200 mg supplementation daily is recommended for patients with osteoporosis.  - If patient is worried about constipation on Calcium supplement, encouraged patient to take in more calcium-rich foods.  - Bisphosphonate treatment - Discussed need to begin treatment on bisphosphonate.  Discussed options of bisphosphonate treatment with patient today, including Fosamax.  - Patient would like to try as low of a dose as possible.  - Weight Bearing Exercises - Begin walking and practicing other weight-bearing exercises. -Handouts provided - Obtain follow-up DEXA in two years.   2. Environmental and Seasonal Allergies - Refill provided today.  - Advised the patient to begin using AYR or Neilmed sinus rinses BID followed by flonase BID (one spray to each nostril). Advised that the patient may also incorporate allegra or claritin PRN.    3. History of Hyponatremia  - On 06/01/2018 in hospital status post Nissen fundoplication,  sodium was 128; will repeat BMP today to check   4. Vitamin D Supplementation - Vitamin D last checked 04/12/2018 and was 49.6 - Continue on Vitamin D supplementation as prescribed.  See med list below. - Discussed that we want Vitamin D in the 40-60 range.   5. Lifestyle & Preventative Health Maintenance - Advised patient to continue working toward exercising to improve overall mental, physical, and emotional health.    - Encouraged patient to engage in daily physical activity, especially a formal exercise routine.  Recommended that the patient eventually strive for at least 150 minutes of moderate cardiovascular activity per week according to guidelines established by the [facility].   - Healthy dietary habits encouraged, including low-carb, and high amounts of lean protein in diet.   - Patient should also consume adequate amounts of water - half of body weight in oz of water per day.   6. Follow-Up - Prescriptions refilled today PRN. - Return for chronic health maintenance visits as scheduled. - Otherwise, continue to return for CPE and lab work as scheduled.   - Patient knows to call in sooner if desired to address acute concerns or other health goals.    Orders Placed This Encounter  Procedures  . Basic metabolic panel    Meds ordered this encounter  Medications  . montelukast (SINGULAIR) 10 MG tablet    Sig: Take 1 tablet (10 mg total) by mouth at bedtime.    Dispense:  90 tablet    Refill:  3  . alendronate (FOSAMAX) 35 MG tablet    Sig: Take 1 tablet (35 mg total)  by mouth every 7 (seven) days. Take with a full glass of water on an empty stomach.    Dispense:  12 tablet    Refill:  7     Gross side effects, risk and benefits, and alternatives of medications and treatment plan in general discussed with patient.  Patient is aware that all medications have potential side effects and we are unable to predict every side effect or drug-drug interaction that may occur.   Patient  will call with any questions prior to using medication if they have concerns.    Expresses verbal understanding and consents to current therapy and treatment regimen.  No barriers to understanding were identified.  Red flag symptoms and signs discussed in detail.  Patient expressed understanding regarding what to do in case of emergency\urgent symptoms  Please see AVS handed out to patient at the end of our visit for further patient instructions/ counseling done pertaining to today's office visit.   Return for Follow-up for chronic care as previously discussed-BP,chol etc. every 4 months.     Note:  This note was prepared with assistance of Dragon voice recognition software. Occasional wrong-word or sound-a-like substitutions may have occurred due to the inherent limitations of voice recognition software.  This document serves as a record of services personally performed by Mellody Dance, DO. It was created on her behalf by Toni Amend, a trained medical scribe. The creation of this record is based on the scribe's personal observations and the provider's statements to them.   I have reviewed the above medical documentation for accuracy and completeness and I concur.  Mellody Dance 07/29/18 10:22 AM    -----------------------------------------------------------------------------------------------------------------------------------------    Subjective:     HPI: Carla Lewis is a 72 y.o. female who presents to [facility] today for issues as discussed below.  Overweight: Patient has lost 12-15 pounds in her current effort to trim down.  Spine surgeon Dr. Sherwood Gambler provided her with a magnetic belt/ device she wears 30 minutes a day to help regenerate bone growth at her spine;  states she has been "wearing a magnet 30 minutes a day to help regenerate her bone growth."  Osteoporosis on recent bone scan with spine T score of -2.7:   Patient  has never been on Fosamax or Boniva or any other medicine..  States "I just don't know if I want to do that."   She's taking a multivitamin, Vitamin D but not taking Calcium.  She is afraid that Calcium will constipate her.  She has also been taking collagen since July. - Patient notes that she is still taking her once weekly Vitamin D prescription, but unsure about the extent of her other supplementation.   Wt Readings from Last 3 Encounters:  07/29/18 181 lb 11.2 oz (82.4 kg)  05/31/18 189 lb 6 oz (85.9 kg)  05/24/18 189 lb 6 oz (85.9 kg)   BP Readings from Last 3 Encounters:  07/29/18 125/73  06/02/18 (!) 160/84  05/24/18 138/63   Pulse Readings from Last 3 Encounters:  07/29/18 (!) 57  06/02/18 78  05/24/18 70   BMI Readings from Last 3 Encounters:  07/29/18 26.83 kg/m  05/31/18 27.97 kg/m  05/24/18 27.97 kg/m     Patient Care Team    Relationship Specialty Notifications Start End  Mellody Dance, DO PCP - General Family Medicine  08/11/16   Martinique, Peter M, [place] Physician Cardiology  08/11/16   Elsie Saas, MD Consulting Physician Orthopedic  Surgery  08/11/16   Juanita Craver, MD Consulting Physician Gastroenterology  08/11/16   Paula Compton, MD Consulting Physician Obstetrics and Gynecology  08/11/16   Specialists, Dermatology  Dermatology  02/28/17    Comment: yrly exams there  Jackolyn Confer, MD Consulting Physician General Surgery  05/18/17      Patient Active Problem List   Diagnosis Date Noted  . Carotid stenosis 12/19/2013    Priority: High  . Anxiety     Priority: High  . Hypertension     Priority: High  . Hyperlipidemia     Priority: High  . Age related osteoporosis 07/29/2018    Priority: Medium  . S/P Nissen fundoplication (without gastrostomy tube) procedure 05/31/2018    Priority: Medium  . Environmental and seasonal allergies 08/11/2016    Priority: Medium  . GERD (gastroesophageal reflux disease)     Priority: Medium  .  Vitamin D insufficiency 08/11/2016    Priority: Low  . Overweight (BMI 25.0-29.9) 08/11/2016    Priority: Low  . h/o Hyponatremia- 128 on 8/19 07/29/2018  . Chronic pansinusitis 04/12/2018  . 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 Excessive Burping 02/23/2017  . Dysphagia 02/23/2017  . h/o Palpitations   . Liver cyst   . LVH (left ventricular hypertrophy)     Past Medical history, Surgical history, Family history, Social history, Allergies and Medications have been entered into the medical record, reviewed and changed as needed.    Current Meds  Medication Sig  . acetaminophen (TYLENOL) 500 MG tablet Take 500-1,000 mg by mouth every 8 (eight) hours as needed for mild pain or moderate pain.  . cetirizine (ZYRTEC) 10 MG tablet Take 10 mg by mouth daily.  . Cholecalciferol (VITAMIN D) 2000 units tablet Take 2,000 Units by mouth daily.   Marland Kitchen esomeprazole (NEXIUM) 20 MG capsule Take 20 mg by mouth daily as needed.   . fluticasone (FLONASE) 50 MCG/ACT nasal spray Place 2 sprays into both nostrils daily as needed for allergies. After sinus rinses  . lisinopril-hydrochlorothiazide (PRINZIDE,ZESTORETIC) 20-25 MG tablet Take 1 tablet by mouth daily.  . montelukast (SINGULAIR) 10 MG tablet Take 1 tablet (10 mg total) by mouth at bedtime.  . ondansetron (ZOFRAN ODT) 4 MG disintegrating tablet Take 1 tablet (4 mg total) by mouth every 8 (eight) hours as needed for nausea or vomiting.  . ondansetron (ZOFRAN-ODT) 4 MG disintegrating tablet Take 1 tablet (4 mg total) by mouth every 6 (six) hours as needed for nausea.  . pravastatin (PRAVACHOL) 20 MG tablet TAKE 1 TABLET BY MOUTH EVERYDAY AT BEDTIME  . sertraline (ZOLOFT) 25 MG tablet Take 1 tablet (25 mg total) by mouth daily.  . Vitamin D, Ergocalciferol, (DRISDOL) 50000 units CAPS capsule Take 1 capsule (50,000 Units total) by mouth every 7 (seven) days.  . [DISCONTINUED] montelukast (SINGULAIR) 10 MG tablet Take 1 tablet (10 mg  total) by mouth at bedtime. (Patient taking differently: Take 5 mg by mouth at bedtime. )    Allergies:  Allergies  Allergen Reactions  . Darvon [Propoxyphene] Anaphylaxis  . Codeine     UNSPECIFIED REACTION   . Sulfa Drugs Cross Reactors     UNSPECIFIED REACTION   . Bystolic [Nebivolol Hcl] Other (See Comments)    No energy and lightheadedness.  . Diovan [Valsartan] Anxiety     Review of Systems:  A fourteen system review of systems was performed and found to be positive as per HPI.   Objective:   Blood pressure 125/73, pulse Marland Kitchen)  57, height 5' 9"  (1.753 m), weight 181 lb 11.2 oz (82.4 kg), SpO2 99 %. Body mass index is 26.83 kg/m.   General: Well Developed, well nourished, and in no acute distress.  HEENT: Normocephalic, atraumatic Skin: Warm and dry, cap RF less 2 sec, good turgor Chest:  Normal excursion, shape, no gross abn Respiratory: speaking in full sentences, no conversational dyspnea NeuroM-Sk: Ambulates w/o assistance, moves * 4 Psych: A and O *3, insight good, mood-full      Recent Results (from the past 2160 hour(s))  ALT     Status: None   Collection Time: 05/19/18  1:55 PM  Result Value Ref Range   ALT 7 0 - 32 IU/L  Basic metabolic panel     Status: Abnormal   Collection Time: 05/24/18  9:48 AM  Result Value Ref Range   Sodium 136 135 - 145 mmol/L   Potassium 4.0 3.5 - 5.1 mmol/L   Chloride 99 98 - 111 mmol/L   CO2 29 22 - 32 mmol/L   Glucose, Bld 69 (L) 70 - 99 mg/dL   BUN 17 8 - 23 mg/dL   Creatinine, Ser 0.79 0.44 - 1.00 mg/dL   Calcium 9.3 8.9 - 10.3 mg/dL   GFR calc non Af Amer >60 >60 mL/min   GFR calc Af Amer >60 >60 mL/min    Comment: (NOTE) The eGFR has been calculated using the CKD EPI equation. This calculation has not been validated in all clinical situations. eGFR's persistently <60 mL/min signify possible Chronic Kidney Disease.    Anion gap 8 5 - 15    Comment: Performed at [facility], [place] [address], [place], [place] [address]  CBC     Status: Abnormal   Collection Time: 05/24/18  9:48 AM  Result Value Ref Range   WBC 5.3 4.0 - 10.5 K/uL   RBC 4.95 3.87 - 5.11 MIL/uL   Hemoglobin 13.1 12.0 - 15.0 g/dL   HCT 39.7 36.0 - 46.0 %   MCV 80.2 78.0 - 100.0 fL   MCH 26.5 26.0 - 34.0 pg   MCHC 33.0 30.0 - 36.0 g/dL   RDW 14.5 11.5 - 15.5 %   Platelets 408 (H) 150 - 400 K/uL    Comment: Performed at [facility], [place] [address] [place], [place] [address]  Basic metabolic panel     Status: Abnormal   Collection Time: 06/01/18  5:00 AM  Result Value Ref Range   Sodium 128 (L) 135 - 145 mmol/L   Potassium 3.8 3.5 - 5.1 mmol/L   Chloride 97 (L) 98 - 111 mmol/L   CO2 23 22 - 32 mmol/L   Glucose, Bld 161 (H) 70 - 99 mg/dL   BUN 13 8 - 23 mg/dL   Creatinine, Ser 0.60 0.44 - 1.00 mg/dL   Calcium 8.5 (L) 8.9 - 10.3 mg/dL   GFR calc non Af Amer >60 >60 mL/min   GFR calc Af Amer >60 >60 mL/min    Comment: (NOTE) The eGFR has been calculated using the CKD EPI equation. This calculation has not been validated in all clinical situations. eGFR's persistently <60 mL/min signify possible Chronic Kidney Disease.    Anion gap 8 5 - 15    Comment: Performed at [facility], [place] [address], [place], [place] [address]  Troponin I     Status: Abnormal   Collection Time: 06/02/18 12:37 AM  Result Value Ref Range   Troponin I 0.04 (  HH) <0.03 ng/mL    Comment: CRITICAL RESULT CALLED TO, READ BACK BY AND VERIFIED WITH: A PEREZ,RN @0209  06/02/18 MKELLY Performed at [facility], [place] [address], [place], [place] [address]

## 2018-07-29 NOTE — Patient Instructions (Signed)
Osteoporosis Osteoporosis is the thinning and loss of density in the bones. Osteoporosis makes the bones more brittle, fragile, and likely to break (fracture). Over time, osteoporosis can cause the bones to become so weak that they fracture after a simple fall. The bones most likely to fracture are the bones in the hip, wrist, and spine. What are the causes? The exact cause is not known. What increases the risk? Anyone can develop osteoporosis. You may be at greater risk if you have a family history of the condition or have poor nutrition. You may also have a higher risk if you are:  Female.  72 years old or older.  A smoker.  Not physically active.  White or Asian.  Slender.  What are the signs or symptoms? A fracture might be the first sign of the disease, especially if it results from a fall or injury that would not usually cause a bone to break. Other signs and symptoms include:  Low back and neck pain.  Stooped posture.  Height loss.  How is this diagnosed? To make a diagnosis, your health care provider may:  Take a medical history.  Perform a physical exam.  Order tests, such as: ? A bone mineral density test. ? A dual-energy X-ray absorptiometry test.  How is this treated? The goal of osteoporosis treatment is to strengthen your bones to reduce your risk of a fracture. Treatment may involve:  Making lifestyle changes, such as: ? Eating a diet rich in calcium. ? Doing weight-bearing and muscle-strengthening exercises. ? Stopping tobacco use. ? Limiting alcohol intake.  Taking medicine to slow the process of bone loss or to increase bone density.  Monitoring your levels of calcium and vitamin D.  Follow these instructions at home:  Include calcium and vitamin D in your diet. Calcium is important for bone health, and vitamin D helps the body absorb calcium.  Perform weight-bearing and muscle-strengthening exercises as directed by your health care  provider.  Do not use any tobacco products, including cigarettes, chewing tobacco, and electronic cigarettes. If you need help quitting, ask your health care provider.  Limit your alcohol intake.  Take medicines only as directed by your health care provider.  Keep all follow-up visits as directed by your health care provider. This is important.  Take precautions at home to lower your risk of falling, such as: ? Keeping rooms well lit and clutter free. ? Installing safety rails on stairs. ? Using rubber mats in the bathroom and other areas that are often wet or slippery. Get help right away if: You fall or injure yourself. This information is not intended to replace advice given to you by your health care provider. Make sure you discuss any questions you have with your health care provider. Document Released: 07/15/2005 Document Revised: 03/09/2016 Document Reviewed: 03/15/2014 Elsevier Interactive Patient Education  2018 [employer].     Preventing Osteoporosis, Adult Osteoporosis is a condition that causes the bones to get weaker. With osteoporosis, the bones become thinner, and the normal spaces in bone tissue become larger. This can make the bones weak and cause them to break more easily. People who have osteoporosis are more likely to break their wrist, spine, or hip. Even a minor accident or injury can be enough to break weak bones. Osteoporosis can occur with aging. Your body constantly replaces old bone tissue with new tissue. As you get older, you may lose bone tissue more quickly, or it may be replaced more slowly.  Osteoporosis is more likely to develop if you have poor nutrition or do not get enough calcium or vitamin D. Other lifestyle factors can also play a role. By making some diet and lifestyle changes, you can help to keep your bones healthy and help to prevent osteoporosis. What nutrition changes can be made? Nutrition plays an important role in maintaining healthy,  strong bones.  Make sure you get enough calcium every day from food or from calcium supplements. ? If you are age 22 or younger, aim to get 1,000 mg of calcium every day. ? If you are older than age 70, aim to get 1,200 mg of calcium every day.  Try to get enough vitamin D every day. ? If you are age 68 or younger, aim to get 600 international units (IU) every day. ? If you are older than age 30, aim to get 800 international units every day.  Follow a healthy diet. Eat plenty of foods that contain calcium and vitamin D. ? Calcium is in milk, cheese, yogurt, and other dairy products. Some fish and vegetables are also good sources of calcium. Many foods such as cereals and breads have had calcium added to them (are fortified). Check nutrition labels to see how much calcium is in a food or drink. ? Foods that contain vitamin D include milk, cereals, salmon, and tuna. Your body also makes vitamin D when you are out in the sun. Bare skin exposure to the sun on your face, arms, legs, or back for no more than 30 minutes a day, 2 times per week is more than enough. Beyond that, it is important to use sunblock to protect your skin from sunburn, which increases your risk for skin cancer.  What lifestyle changes can be made? Making changes in your everyday life can also play an important role in preventing osteoporosis.  Stay active and get exercise every day. Ask your health care provider what types of exercise are best for you.  Do not use any products that contain nicotine or tobacco, such as cigarettes and e-cigarettes. If you need help quitting, ask your health care provider.  Limit alcohol intake to no more than 1 drink a day for nonpregnant women and 2 drinks a day for men. One drink equals 12 oz of beer, 5 oz of wine, or 1 oz of hard liquor.  Why are these changes important? Making these nutrition and lifestyle changes can:  Help you develop and maintain healthy, strong bones.  Prevent loss  of bone mass and the problems that are caused by that loss, such as broken bones and delayed healing.  Make you feel better mentally and physically.  What can happen if changes are not made? Problems that can result from osteoporosis can be very serious. These may include:  A higher risk of broken bones that are painful and do not heal well.  Physical malformations, such as a collapsed spine or a hunched back.  Problems with movement.  Where to find support: If you need help making changes to prevent osteoporosis, talk with your health care provider. You can ask for a referral to a diet and nutrition specialist (dietitian) and a physical therapist. Where to find more information: Learn more about osteoporosis from:  NIH Osteoporosis and Related Beacon: www.niams.GolfingGoddess.com.br  [place] Office on Women's Health: SouvenirBaseball.es.html  National Osteoporosis Foundation: ProfilePeek.ch  Summary  Osteoporosis is a condition that causes weak bones that are more likely to break.  Eating a  healthy diet and making sure you get enough calcium and vitamin D can help prevent osteoporosis.  Other ways to reduce your risk of osteoporosis include getting regular exercise and avoiding alcohol and products that contain nicotine or tobacco. This information is not intended to replace advice given to you by your health care provider. Make sure you discuss any questions you have with your health care provider. Document Released: 10/20/2015 Document Revised: 06/15/2016 Document Reviewed: 06/15/2016 Elsevier Interactive Patient Education  2018 [employer].      Alendronate; Cholecalciferol tablets What is this medicine? ALENDRONATE; CHOLECALCIFEROL (a LEN droe nate; KOL e cal SIF er ol) has two medicines to help reduce calcium loss from  bones and to increase the production of normal, healthy bone in patients with osteoporosis. This medicine may be used for other purposes; ask your health care provider or pharmacist if you have questions. COMMON BRAND NAME(S): Fosamax Plus D What should I tell my health care provider before I take this medicine? They need to know if you have any of these conditions: - dental disease -kidney disease -low level or high level of blood calcium -problems sitting or standing for 30 minutes -problems swallowing -stomach, intestine, or esophagus problems like acid reflux or GERD -vitamin D toxicity -an unusual or allergic reaction to alendronate, cholecalciferol, medicines, foods, lactose, dyes, or preservatives -pregnant or trying to get pregnant -breast-feeding How should I use this medicine? You must take this medicine exactly as directed or you will lower the amount of medicine you absorb into your body or you may cause yourself harm. Take your dose by mouth first thing in the morning, after you are up for the day. Do not eat or drink anything before you take this medicine. Swallow your medicine with a full glass (6 to 8 fluid ounces) of plain water. Do not take this tablet with any with any other drink. Follow the directions on the prescription label. After taking this medicine, do not eat breakfast, drink, or take any medicines or vitamins for at least 30 minutes. Stand or sit up for at least 30 minutes after you take this medicine; do not lie down. Take the medicine on the same day every week. Do not take your medicine more often than directed. Do not stop taking except on your doctor's advice. Talk to your pediatrician regarding the use of this medicine in children. Special care may be needed. Overdosage: If you think you have taken too much of this medicine contact a poison control center or emergency room at once. NOTE: This medicine is only for you. Do not share this medicine with others. What  if I miss a dose? If you miss a dose, take the dose on the morning after you remember. Take your next dose on your regular chosen day of the week, but do not take 2 tablets on the same day. Do not take double or extra doses. What may interact with this medicine? -antacids -aspirin and aspirin like drugs -calcium supplements, especially calcium with vitamin D -cholestyramine or colestipol -cimetidine, ranitidine, or other medicines used to decrease stomach acid -corticosteroids -iron supplements -magnesium supplements -mineral oil -NSAIDs, medicines for pain and inflammation, like ibuprofen or naproxen -orlistat -phenobarbital -phenytoin -phosphorous supplements -primidone -steroid medicines like prednisone or cortisone -thiazide diuretics -vitamins with minerals, especially with vitamin D This list may not describe all possible interactions. Give your health care provider a list of all the medicines, herbs, non-prescription drugs, or dietary supplements you use. Also tell them  if you smoke, drink alcohol, or use illegal drugs. Some items may interact with your medicine. What should I watch for while using this medicine? Visit your doctor or health care professional for regular checkups. It may be some time before you see the benefit from this medicine. Do not stop taking your medicine unless your doctor tells you to. Your doctor may order blood tests or other tests to see how you are doing. You should make sure that you get enough calcium and vitamin D while you are taking this medicine. Discuss the foods you eat and the vitamins you take with your health care professional. If you have pain when swallowing, difficulty swallowing, heartburn, or stomach pain, immediately call your doctor or health care professional. If you are taking an antacid, a mineral supplement like calcium or iron, or a vitamin with minerals, wait to take them at least 30 minutes after you take this medicine. Do not  take them at same time. This medicine can make you more sensitive to the sun. If you get a rash while taking this medicine, sunlight may cause the rash to get worse. Keep out of the sun. If you cannot avoid being in the sun, wear protective clothing and use sunscreen. Do not use sun lamps or tanning beds/booths. Some people who take this medicine have severe bone, joint, and/or muscle pain. This medicine may also increase your risk for a broken thigh bone. Tell your doctor right away if you have pain in your upper leg or groin. Tell your doctor if you have any pain that does not go away or that gets worse. What side effects may I notice from receiving this medicine? Side effects that you should report to your doctor or health care professional as soon as possible: -allergic reactions like skin rash, itching or hives, swelling of the face, lips, or tongue -bone, muscle, or joint pain -changes in vision -heartburn or chest pain -pain or difficulty swallowing -redness, blistering, peeling or loosening of the skin, including inside the mouth -stomach pain -unusual bleeding or bruising -unusually weak or tired -vomiting Side effects that usually do not require medical attention (report to your doctor or health care professional if they continue or are bothersome): -diarrhea or constipation -headache -nausea -stomach gas or fullness This list may not describe all possible side effects. Call your doctor for medical advice about side effects. You may report side effects to FDA at 1-800-FDA-1088. Where should I keep my medicine? Keep out of the reach of children. Store at room temperature between 20 and 25 degrees C (68 and 77 degrees F). Protect the medicine from moisture and light. Throw away any unused medicine after the expiration date. NOTE: This sheet is a summary. It may not cover all possible information. If you have questions about this medicine, talk to your doctor, pharmacist, or health  care provider.  2018 Elsevier/Gold Standard (2011-04-03 08:57:31)

## 2018-07-30 LAB — BASIC METABOLIC PANEL
BUN/Creatinine Ratio: 29 — ABNORMAL HIGH (ref 12–28)
BUN: 19 mg/dL (ref 8–27)
CALCIUM: 9.3 mg/dL (ref 8.7–10.3)
CO2: 25 mmol/L (ref 20–29)
CREATININE: 0.66 mg/dL (ref 0.57–1.00)
Chloride: 97 mmol/L (ref 96–106)
GFR calc Af Amer: 102 mL/min/{1.73_m2} (ref >59)
GFR, EST NON AFRICAN AMERICAN: 89 mL/min/{1.73_m2} (ref >59)
GLUCOSE: 89 mg/dL (ref 65–99)
POTASSIUM: 4.4 mmol/L (ref 3.5–5.2)
Sodium: 135 mmol/L (ref 134–144)

## 2018-10-05 ENCOUNTER — Ambulatory Visit (INDEPENDENT_AMBULATORY_CARE_PROVIDER_SITE_OTHER): Payer: Medicare Other | Admitting: Family Medicine

## 2018-10-05 ENCOUNTER — Encounter: Payer: Self-pay | Admitting: Family Medicine

## 2018-10-05 VITALS — BP 124/82 | HR 69 | Temp 98.1°F | Ht 69.0 in | Wt 182.0 lb

## 2018-10-05 DIAGNOSIS — F419 Anxiety disorder, unspecified: Secondary | ICD-10-CM

## 2018-10-05 DIAGNOSIS — I10 Essential (primary) hypertension: Secondary | ICD-10-CM | POA: Diagnosis not present

## 2018-10-05 DIAGNOSIS — E7849 Other hyperlipidemia: Secondary | ICD-10-CM

## 2018-10-05 DIAGNOSIS — F4323 Adjustment disorder with mixed anxiety and depressed mood: Secondary | ICD-10-CM | POA: Diagnosis not present

## 2018-10-05 DIAGNOSIS — M810 Age-related osteoporosis without current pathological fracture: Secondary | ICD-10-CM | POA: Diagnosis not present

## 2018-10-05 DIAGNOSIS — R7303 Prediabetes: Secondary | ICD-10-CM | POA: Diagnosis not present

## 2018-10-05 DIAGNOSIS — E559 Vitamin D deficiency, unspecified: Secondary | ICD-10-CM

## 2018-10-05 DIAGNOSIS — I6529 Occlusion and stenosis of unspecified carotid artery: Secondary | ICD-10-CM | POA: Diagnosis not present

## 2018-10-05 DIAGNOSIS — Z79899 Other long term (current) drug therapy: Secondary | ICD-10-CM | POA: Diagnosis not present

## 2018-10-05 DIAGNOSIS — K219 Gastro-esophageal reflux disease without esophagitis: Secondary | ICD-10-CM | POA: Diagnosis not present

## 2018-10-05 DIAGNOSIS — E2839 Other primary ovarian failure: Secondary | ICD-10-CM | POA: Diagnosis not present

## 2018-10-05 DIAGNOSIS — E785 Hyperlipidemia, unspecified: Secondary | ICD-10-CM | POA: Diagnosis not present

## 2018-10-05 NOTE — Progress Notes (Signed)
Impression and Recommendations:    1. Age related osteoporosis, unspecified pathological fracture presence   2. Estrogen deficiency   3. Gastroesophageal reflux disease, esophagitis presence not specified   4. Adjustment disorder with mixed anxiety and depressed mood   5. Other hyperlipidemia   6. Essential hypertension   7. Anxiety   8. Prediabetes   9. High risk medication use   10. Hyperlipidemia, unspecified hyperlipidemia type   11. Stenosis of carotid artery, unspecified laterality   12. Vitamin D insufficiency     - Per pt, has not been checking MyChart due to uncertainty on how to use it.  - Stenosis of carotid artery - Ultrasounds done in 11/2016 and were stable only showing 1-39% stenosis bilaterally, and that she should follow up with her cardiology team once yearly.  1. Age related osteoporosis, unspecified pathological fracture presence - Patient discontinued fosamax.  - Continues on spinology brace/belt treatment through Dr. Sherwood Gambler.  - Discussed that her insurance may cover injections in place of fosamax.   - Patient states she is unsure if she wishes to begin injections at this time.  - Information on Prolia provided today.  - Encouraged patient to engage in weight-bearing exercises and weight lifting twice per week.  2. Vitamin D Insufficiency - Stable at this time. - Continue on Vitamin D as prescribed.  See med list.  3. Gastroesophageal reflux disease, esophagitis presence not specified - Per patient, GERD has resolved with dietary changes. - Patient no longer takes medication for this. - Will continue to monitor.  4. Adjustment disorder with mixed anxiety and depressed mood - Stable at this time. - Continue on Sertraline as prescribed.  See med list below. - Patient tolerating meds well without complication.  Denies S-E  - Reviewed the "spokes of the wheel" of mood and health management.  Stressed the importance of ongoing prudent habits,  including regular exercise, appropriate sleep hygiene, healthful dietary habits, and prayer/meditation to relax.  5. Environmental & Seasonal Allergies - Stable at this time. - Continue on Singulair daily.  See med list below. - Patient tolerating meds well without complication.  Denies S-E  - Advised the patient to begin using AYR or Neilmed sinus rinses BID followed by flonase BID (one spray to each nostril). Advised that the patient may also incorporate allegra or claritin PRN.   6. Other hyperlipidemia - LDL remains elevated. - Resume 40 mg dose of statin.  See med list below. - Patient tolerating meds without complication.  - Re-check CMP in 6-8 weeks.  Re-check cholesterol in 4 months. - Encouraged patient to drink plenty of water and remain physically active to minimize S-E.  - Dietary changes such as low saturated & trans fat and low carb/ ketogenic diets discussed with patient.  Encouraged regular exercise and weight loss when appropriate.   - Educational handouts provided at patient's desire.  - Patient quit taking cholesterol medication and aspirin sometime in September of 2018.  Per pt, felt she needed to stop the medication prior to surgery.  Patient has resumed management since June of 2019.  Since she has been back on pravachol for 5 months, will re-check cholesterol tomorrow.  7. Essential hypertension - Elevated on intake today. - Per patient, "ran in here and had three cups of coffee."  - Continue treatment plan as prescribed.  See med list below. - Patient tolerating meds well without complication.  Denies S-E  - Lifestyle changes such as dash diet and  engaging in a regular exercise program discussed with patient.  Educational handouts provided  - Ambulatory BP monitoring encouraged. Keep log and bring in next OV  8. BMI Counseling Explained to patient what BMI refers to, and what it means medically.    Told patient to think about it as a "medical risk  stratification measurement" and how increasing BMI is associated with increasing risk/ or worsening state of various diseases such as hypertension, hyperlipidemia, diabetes, premature OA, depression etc.  American Heart Association guidelines for healthy diet, basically Mediterranean diet, and exercise guidelines of 30 minutes 5 days per week or more discussed in detail.  Health counseling performed.  All questions answered.  9. Lifestyle & Preventative Health Maintenance - Advised patient to continue working toward exercising to improve overall mental, physical, and emotional health.    - Encouraged patient to engage in daily physical activity, especially a formal exercise routine.  Recommended that the patient eventually strive for at least 150 minutes of moderate cardiovascular activity per week according to guidelines established by the [facility].   - Healthy dietary habits encouraged, including low-carb, and high amounts of lean protein in diet.   - Patient should also consume adequate amounts of water.    Orders Placed This Encounter  Procedures  . Lipid panel  . Hemoglobin A1c  . VITAMIN D 25 Hydroxy (Vit-D Deficiency, Fractures)     Medications Discontinued During This Encounter  Medication Reason  . ondansetron (ZOFRAN ODT) 4 MG disintegrating tablet Completed Course  . ondansetron (ZOFRAN-ODT) 4 MG disintegrating tablet Completed Course     Gross side effects, risk and benefits, and alternatives of medications and treatment plan in general discussed with patient.  Patient is aware that all medications have potential side effects and we are unable to predict every side effect or drug-drug interaction that may occur.   Patient will call with any questions prior to using medication if they have concerns.    Expresses verbal understanding and consents to current therapy and treatment regimen.  No barriers to understanding were identified.  Red flag symptoms and signs discussed in  detail.  Patient expressed understanding regarding what to do in case of emergency\urgent symptoms  Please see AVS handed out to patient at the end of our visit for further patient instructions/ counseling done pertaining to today's office visit.   Return in about 4 months (around 02/04/2019) for bp, pre-diab, chol, osteoporosis etc.     Note:  This note was prepared with assistance of Dragon voice recognition software. Occasional wrong-word or sound-a-like substitutions may have occurred due to the inherent limitations of voice recognition software.   This document serves as a record of services personally performed by Mellody Dance, DO. It was created on her behalf by Toni Amend, a trained medical scribe. The creation of this record is based on the scribe's personal observations and the provider's statements to them.   I have reviewed the above medical documentation for accuracy and completeness and I concur.  Mellody Dance, DO 10/05/2018 1:13 PM       ----------------------------------------------------------------------------------------------------------------------------------------    Subjective:     HPI: Carla Lewis is a 72 y.o. female who presents to [facility] today for issues as discussed below.  Notes her family is working on what they eat, trying to be more healthful.  They are going to [place] on Christmas Day for a trip.  Notes she has not been checking her MyChart due to uncertainty  on how to use it.  GERD Patient no longer taking medication for acid reflux. Notes that she is experiencing none of it.  Mood Confirms that her current low dose of Sertraline is working well. Denies any mood concerns.  Environmental & Seasonal Allergies Confirms that she continues on Singulair daily, and is feeling well. Notes she has not been using sinus rinses.  Osteoporosis Patient stopped taking fosamax.  Notes "It  was weird."  While taking it, felt pain in her abdomen, "almost like a real bad period cramp."  Notes that this occurred the same day as taking the fosamax. She started watching this side effect, "lasted a month [taking fosamax weekly] and said no more."  "It was almost like double up, making you want to go to bed and get a hot water bag."  She's taking "two of the D's" and doing "spinology brace" through Dr. Sherwood Gambler to increase bone growth where the graft was placed in her back.  Notes she is finally doing this and "praying it's going to work."  1. HTN HPI:  -  Her blood pressure has been controlled at home.  Pt is checking it at home.   - Patient reports good compliance with blood pressure medications  - Denies medication S-E   - Smoking Status noted   - She denies new onset of: chest pain, exercise intolerance, shortness of breath, dizziness, visual changes, headache, lower extremity swelling or claudication.    Feels she has had some dizziness lately due to sinuses, "and because of all the rain."  Last 3 blood pressure readings in our office are as follows: BP Readings from Last 3 Encounters:  10/05/18 124/82  07/29/18 125/73  06/02/18 (!) 160/84    Filed Weights   10/05/18 0843  Weight: 182 lb (82.6 kg)   2. 71 y.o. female here for cholesterol follow-up.   Notes she has not been to cardiology in a long time.  - Patient reports good compliance with medications or treatment plan.  She has been taking 20 mg of the cholesterol medication.  - Denies medication S-E   - Smoking Status noted   - She denies new onset of: chest pain, exercise intolerance, shortness of breath, dizziness, visual changes, headache, lower extremity swelling or claudication.   Denies myalgias  The cholesterol last visit was:  Lab Results  Component Value Date   CHOL 239 (H) 04/12/2018   HDL 54 04/12/2018   LDLCALC 165 (H) 04/12/2018   TRIG 99 04/12/2018   CHOLHDL 4.4 04/12/2018     Hepatic Function Latest Ref Rng & Units 05/19/2018 04/12/2018 05/05/2017  Total Protein 6.0 - 8.5 g/dL - 7.2 6.4  Albumin 3.5 - 4.8 g/dL - 4.3 4.2  AST 0 - 40 IU/L - 20 21  ALT 0 - 32 IU/L 7 10 13   Alk Phosphatase 39 - 117 IU/L - 103 75  Total Bilirubin 0.0 - 1.2 mg/dL - 0.5 0.4  Bilirubin, Direct <=0.2 mg/dL - - -    Wt Readings from Last 3 Encounters:  10/05/18 182 lb (82.6 kg)  07/29/18 181 lb 11.2 oz (82.4 kg)  05/31/18 189 lb 6 oz (85.9 kg)   BP Readings from Last 3 Encounters:  10/05/18 124/82  07/29/18 125/73  06/02/18 (!) 160/84   Pulse Readings from Last 3 Encounters:  10/05/18 69  07/29/18 (!) 57  06/02/18 78   BMI Readings from Last 3 Encounters:  10/05/18 26.88 kg/m  07/29/18 26.83 kg/m  05/31/18 27.97 kg/m  Depression screen [facility] 2/9 10/05/2018 07/29/2018 05/19/2018 04/12/2018 04/12/2018  Decreased Interest 0 0 0 0 0  Down, Depressed, Hopeless 0 0 0 0 0  PHQ - 2 Score 0 0 0 0 0  Altered sleeping 0 0 0 0 -  Tired, decreased energy 0 0 0 0 -  Change in appetite 0 0 0 0 -  Feeling bad or failure about yourself  0 0 0 0 -  Trouble concentrating 0 0 0 0 -  Moving slowly or fidgety/restless - 0 0 0 -  Suicidal thoughts 0 0 0 0 -  PHQ-9 Score 0 0 0 0 -  Difficult doing work/chores Not difficult at all - Not difficult at all Not difficult at all -     Patient Care Team    Relationship Specialty Notifications Start End  Mellody Dance, DO PCP - General Family Medicine  08/11/16   Martinique, Peter M, MD Consulting Physician Cardiology  08/11/16   Elsie Saas, MD Consulting Physician Orthopedic Surgery  08/11/16   Juanita Craver, MD Consulting Physician Gastroenterology  08/11/16   Paula Compton, MD Consulting Physician Obstetrics and Gynecology  08/11/16   Specialists, Dermatology  Dermatology  02/28/17    Comment: yrly exams there  Jackolyn Confer, MD Consulting Physician General Surgery  05/18/17      Patient Active Problem List   Diagnosis Date  Noted  . Carotid stenosis 12/19/2013    Priority: High  . Anxiety     Priority: High  . Hypertension     Priority: High  . Hyperlipidemia     Priority: High  . Age related osteoporosis 07/29/2018    Priority: Medium  . S/P Nissen fundoplication (without gastrostomy tube) procedure 05/31/2018    Priority: Medium  . Environmental and seasonal allergies 08/11/2016    Priority: Medium  . GERD (gastroesophageal reflux disease)     Priority: Medium  . Vitamin D insufficiency 08/11/2016    Priority: Low  . Overweight (BMI 25.0-29.9) 08/11/2016    Priority: Low  . h/o Hyponatremia- 128 on 8/19 07/29/2018  . Chronic pansinusitis 04/12/2018  . 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 Excessive Burping 02/23/2017  . Dysphagia 02/23/2017  . h/o Palpitations   . Liver cyst   . LVH (left ventricular hypertrophy)     Past Medical history, Surgical history, Family history, Social history, Allergies and Medications have been entered into the medical record, reviewed and changed as needed.    Current Meds  Medication Sig  . acetaminophen (TYLENOL) 500 MG tablet Take 500-1,000 mg by mouth every 8 (eight) hours as needed for mild pain or moderate pain.  . cetirizine (ZYRTEC) 10 MG tablet Take 10 mg by mouth daily.  . Cholecalciferol (VITAMIN D) 2000 units tablet Take 2,000 Units by mouth daily.   Marland Kitchen esomeprazole (NEXIUM) 20 MG capsule Take 20 mg by mouth daily as needed.   . fluticasone (FLONASE) 50 MCG/ACT nasal spray Place 2 sprays into both nostrils daily as needed for allergies. After sinus rinses  . lisinopril-hydrochlorothiazide (PRINZIDE,ZESTORETIC) 20-25 MG tablet Take 1 tablet by mouth daily.  . montelukast (SINGULAIR) 10 MG tablet Take 1 tablet (10 mg total) by mouth at bedtime.  . pravastatin (PRAVACHOL) 20 MG tablet TAKE 1 TABLET BY MOUTH EVERYDAY AT BEDTIME  . sertraline (ZOLOFT) 25 MG tablet Take 1 tablet (25 mg total) by mouth daily.  . Vitamin D,  Ergocalciferol, (DRISDOL) 50000 units CAPS capsule Take 1 capsule (50,000 Units total) by mouth every 7 (  seven) days.    Allergies:  Allergies  Allergen Reactions  . Darvon [Propoxyphene] Anaphylaxis  . Codeine     UNSPECIFIED REACTION   . Sulfa Drugs Cross Reactors     UNSPECIFIED REACTION   . Bystolic [Nebivolol Hcl] Other (See Comments)    No energy and lightheadedness.  . Diovan [Valsartan] Anxiety     Review of Systems:  A fourteen system review of systems was performed and found to be positive as per HPI.   Objective:   Blood pressure 124/82, pulse 69, temperature 98.1 F (36.7 C), height 5' 9"  (1.753 m), weight 182 lb (82.6 kg), SpO2 99 %. Body mass index is 26.88 kg/m. General:  Well Developed, well nourished, appropriate for stated age.  Neuro:  Alert and oriented,  extra-ocular muscles intact  HEENT:  Normocephalic, atraumatic, neck supple, no carotid bruits appreciated  Skin:  no gross rash, warm, pink. Cardiac:  RRR, S1 S2 Respiratory:  ECTA B/L and A/P, Not using accessory muscles, speaking in full sentences- unlabored. Vascular:  Ext warm, no cyanosis apprec.; cap RF less 2 sec. Psych:  No HI/SI, judgement and insight good, Euthymic mood. Full Affect.

## 2018-10-05 NOTE — Patient Instructions (Addendum)
Also Ms. Elon your carotid ultrasounds were last checked 2 of 2018 which were both minimally stenotic and stable.  Truitt Merle was the provider who ordered them.  For your osteoporosis, please call your insurance and find out how they would cover medicine such as Prolia and other medicines besides bisphosphonates.  Please let them know you did not tolerate Fosamax so asked them what it would take for you to have additional medicines covered. -As we discussed weight lifting 2 days a week as well as weightbearing exercises every day of 30 to 60 minutes would be extremely helpful in preventing any worsening of your bone thinning.  -You will come in tomorrow for fasting blood work and also, continue on the Pravachol at your current dose.  We will see if we need to increase that if your LDL or bad cholesterol is not significantly improved.   Also due to your A1c being slightly elevated in the prediabetic range I will recheck that as well tomorrow   Guidelines for a Low Cholesterol, Low Saturated Fat Diet   Fats - Limit total intake of fats and oils. - Avoid butter, stick margarine, shortening, lard, palm and coconut oils. - Limit mayonnaise, salad dressings, gravies and sauces, unless they are homemade with low-fat ingredients. - Limit chocolate. - Choose low-fat and nonfat products, such as low-fat mayonnaise, low-fat or non-hydrogenated peanut butter, low-fat or fat-free salad dressings and nonfat gravy. - Use vegetable oil, such as canola or olive oil. - Look for margarine that does not contain trans fatty acids. - Use nuts in moderate amounts. - Read ingredient labels carefully to determine both amount and type of fat present in foods. Limit saturated and trans fats! - Avoid high-fat processed and convenience foods.  Meats and Meat Alternatives - Choose fish, chicken, [place] and lean meats. - Use dried beans, peas, lentils and tofu. - Limit egg yolks to three to four per week. - If  you eat red meat, limit to no more than three servings per week and choose loin or round cuts. - Avoid fatty meats, such as bacon, sausage, franks, luncheon meats and ribs. - Avoid all organ meats, including liver.  Dairy - Choose nonfat or low-fat milk, yogurt and cottage cheese. - Most cheeses are high in fat. Choose cheeses made from non-fat milk, such as mozzarella and ricotta cheese. - Choose light or fat-free cream cheese and sour cream. - Avoid cream and sauces made with cream.  Fruits and Vegetables - Eat a wide variety of fruits and vegetables. - Use lemon juice, vinegar or "mist" olive oil on vegetables. - Avoid adding sauces, fat or oil to vegetables.  Breads, Cereals and Grains - Choose whole-grain breads, cereals, pastas and rice. - Avoid high-fat snack foods, such as granola, cookies, pies, pastries, doughnuts and croissants.  Cooking Tips - Avoid deep fried foods. - Trim visible fat off meats and remove skin from poultry before cooking. - Bake, broil, boil, poach or roast poultry, fish and lean meats. - Drain and discard fat that drains out of meat as you cook it. - Add little or no fat to foods. - Use vegetable oil sprays to grease pans for cooking or baking. - Steam vegetables. - Use herbs or no-oil marinades to flavor foods.

## 2018-10-06 ENCOUNTER — Other Ambulatory Visit (INDEPENDENT_AMBULATORY_CARE_PROVIDER_SITE_OTHER): Payer: Medicare Other

## 2018-10-06 DIAGNOSIS — M810 Age-related osteoporosis without current pathological fracture: Secondary | ICD-10-CM

## 2018-10-06 DIAGNOSIS — E2839 Other primary ovarian failure: Secondary | ICD-10-CM | POA: Diagnosis not present

## 2018-10-06 DIAGNOSIS — R7303 Prediabetes: Secondary | ICD-10-CM | POA: Diagnosis not present

## 2018-10-06 DIAGNOSIS — E785 Hyperlipidemia, unspecified: Secondary | ICD-10-CM

## 2018-10-07 LAB — LIPID PANEL
Chol/HDL Ratio: 2.6 ratio (ref 0.0–4.4)
Cholesterol, Total: 136 mg/dL (ref 100–199)
HDL: 52 mg/dL (ref >39)
LDL CALC: 71 mg/dL (ref 0–99)
Triglycerides: 64 mg/dL (ref 0–149)
VLDL CHOLESTEROL CAL: 13 mg/dL (ref 5–40)

## 2018-10-07 LAB — HEMOGLOBIN A1C
ESTIMATED AVERAGE GLUCOSE: 114 mg/dL
Hgb A1c MFr Bld: 5.6 % (ref 4.8–5.6)

## 2018-10-07 LAB — VITAMIN D 25 HYDROXY (VIT D DEFICIENCY, FRACTURES): Vit D, 25-Hydroxy: 59.2 ng/mL (ref 30.0–100.0)

## 2018-10-09 ENCOUNTER — Other Ambulatory Visit: Payer: Self-pay | Admitting: Family Medicine

## 2018-10-25 ENCOUNTER — Ambulatory Visit (INDEPENDENT_AMBULATORY_CARE_PROVIDER_SITE_OTHER): Payer: Medicare Other | Admitting: Family Medicine

## 2018-10-25 ENCOUNTER — Telehealth: Payer: Self-pay | Admitting: Family Medicine

## 2018-10-25 ENCOUNTER — Encounter: Payer: Self-pay | Admitting: Family Medicine

## 2018-10-25 VITALS — BP 121/80 | HR 75 | Temp 98.4°F | Ht 69.0 in | Wt 181.2 lb

## 2018-10-25 DIAGNOSIS — E559 Vitamin D deficiency, unspecified: Secondary | ICD-10-CM | POA: Diagnosis not present

## 2018-10-25 DIAGNOSIS — B9789 Other viral agents as the cause of diseases classified elsewhere: Secondary | ICD-10-CM | POA: Diagnosis not present

## 2018-10-25 DIAGNOSIS — R7309 Other abnormal glucose: Secondary | ICD-10-CM | POA: Diagnosis not present

## 2018-10-25 DIAGNOSIS — J069 Acute upper respiratory infection, unspecified: Secondary | ICD-10-CM

## 2018-10-25 DIAGNOSIS — E7849 Other hyperlipidemia: Secondary | ICD-10-CM | POA: Diagnosis not present

## 2018-10-25 MED ORDER — HYDROCOD POLST-CPM POLST ER 10-8 MG/5ML PO SUER: 5.0000 mL | Freq: Two times a day (BID) | ORAL | 0 refills | Status: DC | PRN

## 2018-10-25 NOTE — Telephone Encounter (Signed)
Patient notified that per Dr. Hershal Coria note needs to wait until symptom have been 10 days or longer.  Patient still declines prednisone at this time. MPulliam, CMA/RT(R)

## 2018-10-25 NOTE — Progress Notes (Signed)
Acute Care Office visit  Assessment and plan:  1. Viral URI with cough   2. Other hyperlipidemia   3. Vitamin D insufficiency   4. Elevated hemoglobin A1c measurement     1. Viral URI - Viral vs Allergic vs Bacterial causes for pt's symptoms extensively reveiwed. - All questions answered.  - Augmentin recommended if patient's symptoms continue for 10 days without improvement. - Reviewed the importance of considering ABX ONLY if sx continue past 10 days and worsening.  - Supportive care and various OTC medications discussed in addition to any prescribed. - Discussed importance of pushing fluids, warm liquids, and resting adequately.  - Patient declines prednisone today.  "Would rather not take it."  - Tussionex prescribed for cough relief.  See med list.  Discussed importance of eating food, such as bread or crackers, prior to taking the cough medicine.  Reviewed that this should help minimize potential S-E of nausea.  - Patient knows to call or RTC if new symptoms, or if no improvement or worse over next several days.    2. Recent Labs Reviewed - Patient desired to have her recent lab work reviewed during appointment today.  - Reviewed recent lab work (10/06/2018) in depth with patient today.  All lab work within normal limits unless otherwise noted.  Extensive education provided.  All questions were answered.  - Lipid Panel; Hyperlipidemia LDL improved to 71 from 165 prior. VLDL 13 from 20 prior. HDL 52, was 54 prior. Triglycerides 64, were 99 prior.  - HbA1c; Elevated in Past Improved to 5.6 from 5.8 prior.  - Vitamin D Insufficiency Improved to 59.2 from 49.6 prior.     Meds ordered this encounter  Medications  . chlorpheniramine-HYDROcodone (TUSSIONEX) 10-8 MG/5ML SUER    Sig: Take 5 mLs by mouth every 12 (twelve) hours as needed for cough (cough, will cause drowsiness.).    Dispense:  200 mL    Refill:  0    Medications Discontinued During This  Encounter  Medication Reason  . alendronate (FOSAMAX) 35 MG tablet Patient Preference     No orders of the defined types were placed in this encounter.   Gross side effects, risk and benefits, and alternatives of medications discussed with patient.  Patient is aware that all medications have potential side effects and we are unable to predict every sideeffect or drug-drug interaction that may occur.  Expresses verbal understanding and consents to current therapy plan and treatment regiment.   Education and routine counseling performed. Handouts provided.  Anticipatory guidance and routine counseling done re: condition, txmnt options and need for follow up. All questions of patient's were answered.  Return for Chol, BP etc 4-80mofor routine care; need yrly Medicare wellness exam as well.  Please see AVS handed out to patient at the end of our visit for additional patient instructions/ counseling done pertaining to today's office visit.  Note:  This document was partially repared using Dragon voice recognition software and may include unintentional dictation errors.  This document serves as a record of services personally performed by DMellody Dance DO. It was created on her behalf by KToni Amend a trained medical scribe. The creation of this record is based on the scribe's personal observations and the provider's statements to them.   I have reviewed the above medical documentation for accuracy and completeness and I concur.  DMellody Dance DO 10/25/2018 5:24 PM       Subjective:    Chief Complaint  Patient presents  with  . Sinus Problem   Recently went to [place] for a week; [employer]. Was exposed to many people, new allergens, different environment, etc.  HPI:  Pt presents with Sx for about seven days.   C/o:  Symptoms for seven days.  States that her laryngitis and sx have come and come and come and gone.  Is experiencing head congestion, dizziness, post  nasal drip, laryngitis, and sleeplessness due to coughing.  Feeling minor pain in the area of her right ear "when she touches it."  Worked all day Saturday (3 days ago), went to church 2 days ago, but "did nothing yesterday," which is unusual for her.  Denies:  Fevers.  One-sided face pain.    For symptoms patient has tried:  Robitussin.  Started Mucinex 2 days ago.  Overall getting:  Getting worse; feels the cough has made her sx worse.  Notes that before she was sick, she had a "prickly rash" on her foot that "came out after walking 10,000 steps" recently at [employer].  She did not experience itching or pain with this rash.  This rash is completely resolved now.  Hyperlipidemia & Lab Review:  The cholesterol last visit was:  Lab Results  Component Value Date   CHOL 136 10/06/2018   HDL 52 10/06/2018   LDLCALC 71 10/06/2018   TRIG 64 10/06/2018   CHOLHDL 2.6 10/06/2018    Hepatic Function Latest Ref Rng & Units 05/19/2018 04/12/2018 05/05/2017  Total Protein 6.0 - 8.5 g/dL - 7.2 6.4  Albumin 3.5 - 4.8 g/dL - 4.3 4.2  AST 0 - 40 IU/L - 20 21  ALT 0 - 32 IU/L _0 Alk Phosphatase 39 - 117 IU/L - 103 75  Total Bilirubin 0.0 - 1.2 mg/dL - 0.5 0.4  Bilirubin, Direct <=0.2 mg/dL - - -     Patient Care Team    Relationship Specialty Notifications Start End  Mellody Dance, DO PCP - General Family Medicine  08/11/16   Martinique, Peter M, MD Consulting Physician Cardiology  08/11/16   Elsie Saas, MD Consulting Physician Orthopedic Surgery  08/11/16   Juanita Craver, MD Consulting Physician Gastroenterology  08/11/16   Paula Compton, MD Consulting Physician Obstetrics and Gynecology  08/11/16   Specialists, Dermatology  Dermatology  02/28/17    Comment: yrly exams there  Jackolyn Confer, MD Consulting Physician General Surgery  05/18/17   Jovita Gamma, MD Consulting Physician Neurosurgery  10/25/18     Past medical history, Surgical history, Family history reviewed and  noted below, Social history, Allergies, and Medications have been entered into the medical record, reviewed and changed as needed.   Allergies  Allergen Reactions  . Darvon [Propoxyphene] Anaphylaxis  . Codeine     UNSPECIFIED REACTION   . Sulfa Drugs Cross Reactors     UNSPECIFIED REACTION   . Bystolic [Nebivolol Hcl] Other (See Comments)    No energy and lightheadedness.  . Diovan [Valsartan] Anxiety    Review of Systems: - see above HPI for pertinent positives General:   No F/C, wt loss Pulm:   No DIB, pleuritic chest pain Card:  No CP, palpitations Abd:  No n/v/d or pain Ext:  No inc edema from baseline   Objective:   Blood pressure 121/80, pulse 75, temperature 98.4 F (36.9 C), height _1  (1.753 m), weight 181 lb 3.2 oz (82.2 kg), SpO2 99 %. Body mass index is 26.76 kg/m. General: Well Developed, well nourished, appropriate for stated age.  Neuro: Alert and oriented x3, extra-ocular muscles intact, sensation grossly intact.  HEENT: Normocephalic, atraumatic, pupils equal round reactive to light, neck supple, no masses, mild LAD right anterior cervical, TM's intact B/L, mild fluid in both ears bilaterally. Nares- patent, clear d/c, OP- clear, mild erythema, No TTP sinuses Skin: Warm and dry, no gross rash. Cardiac: RRR, S1 S2,  no murmurs rubs or gallops.  Respiratory: ECTA B/L and A/P, Not using accessory muscles, speaking in full sentences- unlabored. Vascular:  No gross lower ext edema, cap RF less 2 sec. Psych: No HI/SI, judgement and insight good, Euthymic mood. Full Affect.

## 2018-10-25 NOTE — Telephone Encounter (Signed)
Patient seen for acute sick visit today and was advised by Dr. Jenetta Downer that if she continued to feel bad we could send in an antibiotic for her. She would like to go ahead with this order if at possible, she has gotten worse in her opinion since leaving our office this morning and would like to have this med on hand. Please advise and if applicable send order to [facility] in [place].

## 2018-10-25 NOTE — Patient Instructions (Signed)
You most likely have a viral infection that should resolve on its own over time   Symptoms for a viral upper respiratory tract infection usually last 3-7 days but can stretch out to 2-3 weeks before you're feeling back to normal.  Your symptoms should not worsen after 7-10 days and if they truely do, please notify our office, as you may need antibiotics.  You can use over-the-counter afrin nasal spray for up to 3 days (NO longer than that) which will help acutely with nasal drainage/ congestion short term.   Also, sterile saline nasal rinses, such as Milta Deiters med or AYR sinus rinses, can be very helpful and should be done twice daily- especially throughout the allergy season.   Remember you should use distilled water or previously boiled water to do this.   You can also use an over the counter cold and flu medication such as Tylenol Severe Cold and Sinus/Flu or Dayquil, Nyquil and the like, which will help with cough, congestion, headache/ pain, fevers/chills etc.   Also Mucinex Dm is great for congestion and cough.   Please note, if you being treated for hypertension or have high blood pressure, you should be using the cold meds designated "HBP".    Unfortunately, antibiotics are not helpful for viral infections.  Wash your hands frequently, as you did not want to get those around you sick as well. Never sneeze or cough on others.  And you should not be going to school or work if you are running a temperature of 100.5 or more on two separate occasions.   Drink plenty of fluids and stay hydrated, especially if you are running fevers.  We don't know why, but chicken soup also helps, try it! :)     Guidelines for a Low Cholesterol, Low Saturated Fat Diet   Fats - Limit total intake of fats and oils. - Avoid butter, stick margarine, shortening, lard, palm and coconut oils. - Limit mayonnaise, salad dressings, gravies and sauces, unless they are homemade with low-fat ingredients. - Limit  chocolate. - Choose low-fat and nonfat products, such as low-fat mayonnaise, low-fat or non-hydrogenated peanut butter, low-fat or fat-free salad dressings and nonfat gravy. - Use vegetable oil, such as canola or olive oil. - Look for margarine that does not contain trans fatty acids. - Use nuts in moderate amounts. - Read ingredient labels carefully to determine both amount and type of fat present in foods. Limit saturated and trans fats! - Avoid high-fat processed and convenience foods.  Meats and Meat Alternatives - Choose fish, chicken, [place] and lean meats. - Use dried beans, peas, lentils and tofu. - Limit egg yolks to three to four per week. - If you eat red meat, limit to no more than three servings per week and choose loin or round cuts. - Avoid fatty meats, such as bacon, sausage, franks, luncheon meats and ribs. - Avoid all organ meats, including liver.  Dairy - Choose nonfat or low-fat milk, yogurt and cottage cheese. - Most cheeses are high in fat. Choose cheeses made from non-fat milk, such as mozzarella and ricotta cheese. - Choose light or fat-free cream cheese and sour cream. - Avoid cream and sauces made with cream.  Fruits and Vegetables - Eat a wide variety of fruits and vegetables. - Use lemon juice, vinegar or "mist" olive oil on vegetables. - Avoid adding sauces, fat or oil to vegetables.  Breads, Cereals and Grains - Choose whole-grain breads, cereals, pastas and rice. - Avoid high-fat snack  foods, such as granola, cookies, pies, pastries, doughnuts and croissants.  Cooking Tips - Avoid deep fried foods. - Trim visible fat off meats and remove skin from poultry before cooking. - Bake, broil, boil, poach or roast poultry, fish and lean meats. - Drain and discard fat that drains out of meat as you cook it. - Add little or no fat to foods. - Use vegetable oil sprays to grease pans for cooking or baking. - Steam vegetables. - Use herbs or no-oil marinades  to flavor foods.

## 2018-10-27 DIAGNOSIS — H524 Presbyopia: Secondary | ICD-10-CM | POA: Diagnosis not present

## 2018-10-27 DIAGNOSIS — H52222 Regular astigmatism, left eye: Secondary | ICD-10-CM | POA: Diagnosis not present

## 2018-10-27 DIAGNOSIS — H5213 Myopia, bilateral: Secondary | ICD-10-CM | POA: Diagnosis not present

## 2018-10-27 DIAGNOSIS — H25813 Combined forms of age-related cataract, bilateral: Secondary | ICD-10-CM | POA: Diagnosis not present

## 2018-10-27 DIAGNOSIS — H40033 Anatomical narrow angle, bilateral: Secondary | ICD-10-CM | POA: Diagnosis not present

## 2018-12-13 DIAGNOSIS — I4891 Unspecified atrial fibrillation: Secondary | ICD-10-CM | POA: Insufficient documentation

## 2018-12-13 DIAGNOSIS — Z79899 Other long term (current) drug therapy: Secondary | ICD-10-CM | POA: Diagnosis not present

## 2018-12-13 DIAGNOSIS — F419 Anxiety disorder, unspecified: Secondary | ICD-10-CM | POA: Diagnosis not present

## 2018-12-13 DIAGNOSIS — I340 Nonrheumatic mitral (valve) insufficiency: Secondary | ICD-10-CM | POA: Insufficient documentation

## 2018-12-13 DIAGNOSIS — I081 Rheumatic disorders of both mitral and tricuspid valves: Secondary | ICD-10-CM | POA: Diagnosis not present

## 2018-12-13 DIAGNOSIS — I10 Essential (primary) hypertension: Secondary | ICD-10-CM | POA: Diagnosis not present

## 2018-12-13 DIAGNOSIS — Z539 Procedure and treatment not carried out, unspecified reason: Secondary | ICD-10-CM | POA: Diagnosis not present

## 2018-12-13 DIAGNOSIS — E7800 Pure hypercholesterolemia, unspecified: Secondary | ICD-10-CM | POA: Diagnosis not present

## 2018-12-13 DIAGNOSIS — R002 Palpitations: Secondary | ICD-10-CM | POA: Diagnosis not present

## 2018-12-14 DIAGNOSIS — I10 Essential (primary) hypertension: Secondary | ICD-10-CM | POA: Diagnosis not present

## 2018-12-14 DIAGNOSIS — I4891 Unspecified atrial fibrillation: Secondary | ICD-10-CM | POA: Diagnosis not present

## 2018-12-14 DIAGNOSIS — I340 Nonrheumatic mitral (valve) insufficiency: Secondary | ICD-10-CM | POA: Diagnosis not present

## 2018-12-19 ENCOUNTER — Ambulatory Visit (INDEPENDENT_AMBULATORY_CARE_PROVIDER_SITE_OTHER): Payer: Medicare Other | Admitting: Nurse Practitioner

## 2018-12-19 ENCOUNTER — Encounter: Payer: Self-pay | Admitting: Nurse Practitioner

## 2018-12-19 ENCOUNTER — Telehealth: Payer: Self-pay | Admitting: Nurse Practitioner

## 2018-12-19 VITALS — BP 132/84 | HR 60 | Ht 68.0 in | Wt 183.0 lb

## 2018-12-19 DIAGNOSIS — Z79899 Other long term (current) drug therapy: Secondary | ICD-10-CM

## 2018-12-19 DIAGNOSIS — I10 Essential (primary) hypertension: Secondary | ICD-10-CM

## 2018-12-19 DIAGNOSIS — I480 Paroxysmal atrial fibrillation: Secondary | ICD-10-CM

## 2018-12-19 DIAGNOSIS — Z7901 Long term (current) use of anticoagulants: Secondary | ICD-10-CM | POA: Diagnosis not present

## 2018-12-19 MED ORDER — APIXABAN 5 MG PO TABS: 5.0000 mg | ORAL_TABLET | Freq: Two times a day (BID) | ORAL | 4 refills | Status: AC

## 2018-12-19 MED ORDER — DILTIAZEM HCL ER COATED BEADS 180 MG PO CP24: 180.0000 mg | ORAL_CAPSULE | Freq: Every day | ORAL | 3 refills | Status: DC

## 2018-12-19 NOTE — Telephone Encounter (Signed)
Pt received Cartia today instead of Cardizem, pt wanted to make sure this was the same medication.  This is ok to take.  Pt stated bottle stated was for bp, stated this is for bp and HR.

## 2018-12-19 NOTE — Progress Notes (Signed)
CARDIOLOGY OFFICE NOTE  Date:  12/19/2018    Carla Lewis Date of Birth: [birth year] Medical Record #[id number]  PCP:  Mellody Dance, DO  Cardiologist:  Richel Millspaugh & Martinique    Chief Complaint  Patient presents with  . Follow-up  . Atrial Fibrillation    Work in/follow up visit - seen for Dr. Martinique    History of Present Illness: Carla Lewis is a 73 y.o. female who presents today for a follow up visit.  Seen for Dr. Martinique. Has primarily followed with me.   She has HTN with LVH. She is intolerant to Bystolic and to Diovan.   Other issues include HLD, GERD, allergies and depression/anxiety. Dr. Dagmar Hait usually follows her labs. She has minimal nonobstructive plaque on carotid duplex from February of 2015 and has seen Dr. Fletcher Anon for review. She had a repeat carotid Doppler in February of 2015 which showed mild atherosclerosis in the left side with less than 39% stenosis and increased velocity on the right side suggestive of 60-79% stenosis although no obvious stenosis was noted. Last carotids from 12/2014 actually improved and he recommended repeat study in 2018. Last Myoview was from 2010.   I have followed her over the years - she has had more issues with labile BP - typically with stress/anxiety issues. CV risk factor modification is a struggle at times.   Last seen in October of 2017.  Looks to have had L4/L5 surgery with Dr. Sherwood Gambler in January of 2019, followed by hiatal hernia repair in July of 2019.   Comes in today. Here with her husband today. She has not been here in quite some time.  Her good friend - Carla Lewis - that I also took care of had died and she has been settling estates. She brings in records - She was in [place], [place] - felt rapid heart beating - has been found to have AF. Now on Multaq and Eliquis - was told to see Dr. Rayann Heman - friend/colleague of Dr. Koleen Nimrod from [facility]. She converted just prior to TEE/cardioversion. Moderate MR & mild to  moderate TR noted on TTE. EF > 70% with mild LVH. LAE noted.   CHADSVASC is 68 (age, gender, HTN and PAD)  She now feels fine. Tolerating her medicine. Multaq cost $314 dollars - pretty pricey for her. Understands need for long term anticoagulation and is ok with that. No problems so far with this. No chest pain. Breathing is ok. No real snoring - feels refreshed when she wakes up. Her weight is down from my last visit. Her labs have previously been checked with PCP. Has not had any recurrence.   Past Medical History:  Diagnosis Date  . Ankle bruise    SPONTANEOUS BRUISE ON RIGHT ANKLE  . Anxiety   . Complication of anesthesia   . GERD (gastroesophageal reflux disease)   . History of hiatal hernia   . Hyperlipidemia   . Hypertension   . Kidney cysts   . Liver cyst   . Liver cyst   . Lumbar stenosis   . LVH (left ventricular hypertrophy)    MILD LVH per echo in 2007  . Mitral valve regurgitation   . Obesity   . Palpitations   . PONV (postoperative nausea and vomiting)     Past Surgical History:  Procedure Laterality Date  . BREAST LUMPECTOMY Right 1995   RIGHT BREAST  . CHOLECYSTECTOMY    . ESOPHAGEAL MANOMETRY N/A 06/23/2017   Procedure: ESOPHAGEAL MANOMETRY (  EM);  Surgeon: Juanita Craver, MD;  Location: [facility];  Service: Endoscopy;  Laterality: N/A;  . INSERTION OF MESH N/A 05/31/2018   Procedure: INSERTION OF MESH;  Surgeon: Ralene Ok, MD;  Location: [facility] ORS;  Service: General;  Laterality: N/A;  . LIPOMA EXCISION    . MENISCUS REPAIR Left   . US ECHOCARDIOGRAPHY  2007   SHOWED EF OF 55-60%     Medications: Current Meds  Medication Sig  . acetaminophen (TYLENOL) 500 MG tablet Take 500-1,000 mg by mouth every 8 (eight) hours as needed for mild pain or moderate pain.  . cetirizine (ZYRTEC) 10 MG tablet Take 10 mg by mouth daily.  . Cholecalciferol (VITAMIN D) 2000 units tablet Take 2,000 Units by mouth daily.   Marland Kitchen ELDERBERRY PO Take by mouth.  . fluticasone  (FLONASE) 50 MCG/ACT nasal spray Place 2 sprays into both nostrils daily as needed for allergies. After sinus rinses  . lisinopril-hydrochlorothiazide (PRINZIDE,ZESTORETIC) 20-25 MG tablet Take 1 tablet by mouth daily.  . montelukast (SINGULAIR) 10 MG tablet Take 1 tablet (10 mg total) by mouth at bedtime.  . Multiple Vitamins-Minerals (MULTI ADULT GUMMIES) CHEW Chew by mouth.  . pravastatin (PRAVACHOL) 20 MG tablet TAKE 1 TABLET BY MOUTH EVERYDAY AT BEDTIME  . sertraline (ZOLOFT) 25 MG tablet Take 1 tablet (25 mg total) by mouth daily.  . Vitamin D, Ergocalciferol, (DRISDOL) 50000 units CAPS capsule Take 1 capsule (50,000 Units total) by mouth every 7 (seven) days.  . [DISCONTINUED] apixaban (ELIQUIS) 5 MG TABS tablet Take 5 mg by mouth 2 (two) times daily.   . [DISCONTINUED] dronedarone (MULTAQ) 400 MG tablet Take by mouth.     Allergies: Allergies  Allergen Reactions  . Darvon [Propoxyphene] Anaphylaxis  . Codeine     UNSPECIFIED REACTION   . Sulfa Drugs Cross Reactors     UNSPECIFIED REACTION   . Bystolic [Nebivolol Hcl] Other (See Comments)    No energy and lightheadedness.  . Diovan [Valsartan] Anxiety    Social History: The patient  reports that she has never smoked. She has never used smokeless tobacco. She reports that she does not drink alcohol or use drugs.   Family History: The patient's family history includes AAA (abdominal aortic aneurysm) in her father; Dementia in her mother; Heart disease in her mother; Heart disease (age of onset: 43) in her father; Hypertension (age of onset: 66) in her father.   Review of Systems: Please see the history of present illness.   Otherwise, the review of systems is positive for none.   All other systems are reviewed and negative.   Physical Exam: VS:  BP 132/84 (BP Location: Left Arm, Patient Position: Sitting, Cuff Size: Normal)   Pulse 60   Ht 5\' 8"  (1.727 m)   Wt 183 lb (83 kg)   BMI 27.83 kg/m  .  BMI Body mass index is  27.83 kg/m.  Wt Readings from Last 3 Encounters:  12/19/18 183 lb (83 kg)  10/25/18 181 lb 3.2 oz (82.2 kg)  10/05/18 182 lb (82.6 kg)    General: Pleasant. Well developed, well nourished and in no acute distress.  Her weight is down from 190 at my last visit.  HEENT: Normal.  Neck: Supple, no JVD, carotid bruits, or masses noted.  Cardiac: Regular rate and rhythm. No murmurs, rubs, or gallops. No edema.  Respiratory:  Lungs are clear to auscultation bilaterally with normal work of breathing.  GI: Soft and nontender.  MS: No deformity or  atrophy. Gait and ROM intact.  Skin: Warm and dry. Color is normal.  Neuro:  Strength and sensation are intact and no gross focal deficits noted.  Psych: Alert, appropriate and with normal affect.   LABORATORY DATA:  EKG:  EKG is ordered today. This demonstrates NSR - reviewed with Dr. Rayann Heman.  Lab Results  Component Value Date   WBC 5.3 05/24/2018   HGB 13.1 05/24/2018   HCT 39.7 05/24/2018   PLT 408 (H) 05/24/2018   GLUCOSE 89 07/29/2018   CHOL 136 10/06/2018   TRIG 64 10/06/2018   HDL 52 10/06/2018   LDLCALC 71 10/06/2018   ALT 7 05/19/2018   AST 20 04/12/2018   NA 135 07/29/2018   K 4.4 07/29/2018   CL 97 07/29/2018   CREATININE 0.66 07/29/2018   BUN 19 07/29/2018   CO2 25 07/29/2018   TSH 2.250 04/12/2018   HGBA1C 5.6 10/06/2018   MICROALBUR 19.0 04/28/2016     BNP (last 3 results) No results for input(s): BNP in the last 8760 hours.  ProBNP (last 3 results) No results for input(s): PROBNP in the last 8760 hours.   Other Studies Reviewed Today:          Notes Recorded by Wellington Hampshire, MD on 12/16/2014 at 12:54 PM Carotid Doppler showed minor atherosclerosis. The velocities are actually better than last year. Recommend repeat carotid Doppler in 2 years.    Assessment/Plan: 1. PAF - lone episode - symptomatic - now on Eliquis and Multaq - asking about EP referral - I have discussed with Dr. Rayann Heman  here today who has seen her with me today - we have elected to stop the Multaq due to the expense - take the approach of watchful waiting - will start Cardizem 180 mg a day - she will remain on her anticoagulation - I will see back in 4 to 6 weeks. If she starts having recurrence - could consider Tikosyn or Flecainide (no known CAD) as well as EP referral. She is agreeable with this plan.   2. HTN - BP looks good here today. No changes made today.   3. Carotid disease - behind on doppler study - will arrange on follow up.   4. HLD - on statin therapy.    Current medicines are reviewed with the patient today.  The patient does not have concerns regarding medicines other than what has been noted above.  The following changes have been made:  See above.  Labs/ tests ordered today include:    Orders Placed This Encounter  Procedures  . EKG 12-Lead     Disposition:   FU with me in 4 to 6 weeks.    Patient is agreeable to this plan and will call if any problems develop in the interim.   SignedTruitt Merle, NP  12/19/2018 12:47 PM  [facility] [address] [place], [place]  [address] Phone: [phone] Fax: [phone]

## 2018-12-19 NOTE — Patient Instructions (Addendum)
We will be checking the following labs today - NONE   Medication Instructions:    Continue with your current medicines. BUT  STOP Multaq   Start Cardizem 180 mg a day   Stay on Eliquis   If you need a refill on your cardiac medications before your next appointment, please call your pharmacy.     Testing/Procedures To Be Arranged:  N/A  Follow-Up:   See me in about 4 to 6 weeks.     At [facility], you and your health needs are our priority.  As part of our continuing mission to provide you with exceptional heart care, we have created designated Provider Care Teams.  These Care Teams include your primary Cardiologist (physician) and Advanced Practice Providers (APPs -  Physician Assistants and Nurse Practitioners) who all work together to provide you with the care you need, when you need it.  Special Instructions:  . If you have recurrent atrial fib - you may take an extra Cardizem   Call the [place] office at [phone] if you have any questions, problems or concerns.

## 2018-12-19 NOTE — Telephone Encounter (Signed)
New Message   Patient states she was prescribe a generic brand for a medication today and she wants to speak to the nurse to see if should should be taking the medication and doesn't want to give me the name she wants to speak to the nurse today.

## 2019-01-15 ENCOUNTER — Other Ambulatory Visit: Payer: Self-pay | Admitting: Family Medicine

## 2019-01-18 ENCOUNTER — Telehealth: Payer: Self-pay | Admitting: *Deleted

## 2019-01-18 MED ORDER — COLLAGEN 500 MG PO CAPS: 1000.0000 mg | ORAL_CAPSULE | Freq: Every day | ORAL | 0 refills | Status: AC

## 2019-01-18 MED ORDER — CALCIUM CITRATE 150 MG PO CAPS: 2.0000 | ORAL_CAPSULE | Freq: Every day | ORAL | 0 refills | Status: DC

## 2019-01-18 NOTE — Telephone Encounter (Signed)
S/w pt regarding upcoming VT visit.  Last weight was in office at 183 lb, no recent bp's, no refills,  no concerns.  Added lots of OTC meds to pt's list. Will send to [facility].

## 2019-01-22 NOTE — Progress Notes (Signed)
Telehealth Visit     Virtual Visit via Video Note   This visit type was conducted due to national recommendations for restrictions regarding the COVID-19 Pandemic (e.g. social distancing) in an effort to limit this patient's exposure and mitigate transmission in our community.  Due to her co-morbid illnesses, this patient is at least at moderate risk for complications without adequate follow up.  This format is felt to be most appropriate for this patient at this time.  All issues noted in this document were discussed and addressed.  A limited physical exam was performed with this format.  Please refer to the patient's chart for her consent to telehealth for [facility].   Evaluation Performed:  Follow-up visit  This visit type was conducted due to national recommendations for restrictions regarding the COVID-19 Pandemic (e.g. social distancing).  This format is felt to be most appropriate for this patient at this time.  All issues noted in this document were discussed and addressed.  No physical exam was performed (except for noted visual exam findings with Video Visits).  Please refer to the patient's chart (MyChart message for video visits and phone note for telephone visits) for the patient's consent to telehealth for [facility].  Date:  01/23/2019   ID:  Carla Lewis, DOB [birth year], MRN [id number]  Patient Location:  Home  Provider location:   Home  PCP:  Mellody Dance, DO  Cardiologist:  Servando Snare & No primary care provider on file.  Electrophysiologist:  None   Chief Complaint:  Follow up visit for PAF  History of Present Illness:    Carla Lewis is a 73 y.o. female who presents via audio/video conferencing for a telehealth visit today.  Seen for Dr. Martinique.Has primarily followed with me.   She has HTN with LVH. She has been intolerant to Bystolic and to Diovan.   Other issues include HLD, GERD, allergies and depression/anxiety. Dr. Dagmar Hait usually  follows her labs. She has minimal nonobstructive plaque on carotid duplex from February of 2015 and has seen Dr. Fletcher Anon for review. She had a repeat carotid Doppler in February of 2015 which showed mild atherosclerosis in the left side with less than 39% stenosis and increased velocity on the right side suggestive of 60-79% stenosis although no obvious stenosis was noted. Last carotids from 12/2014 actually improved and he recommended repeat study in 2018. Last Myoview was from 2010.   I have followed her over the years - she has had more issues with labile BP - typically with stress/anxiety issues. CV risk factor modification is a struggle at times.   Last seen in October of 2017.  Looks to have had L4/L5 surgery with Dr. Sherwood Gambler in January of 2019, followed by hiatal hernia repair in July of 2019.   I then saw her a little over a month ago - she had not been here in quite some time - she had been setting [name] (I also took care of Debroah Baller'). She had had a spell of documented PAF - was in [place] - placed on Multaq and Eliquis - converted just prior to having TEE/cardioversion. Echo with moderate MR and mild to moderate TR on TTE. EF was over 70%. She was doing well at our visit - cost of Multaq was quite high - I spoke with Dr. Rayann Heman who also saw here with me at last visit -we elected to stop the Multaq and manage with CCB and continued anticoagulation with Eliquis. No real indication for  sleep study. She had not had recurrent AF.   CHADSVASC is 55 (age, gender, HTN and PAD)  The patient does not have symptoms concerning for COVID-19 infection (fever, chills, cough, or new shortness of breath).   Seen today via video. WebEx did not work - we switched to Face Time without issue. She has consented for this conversation. She is doing well. Feels great. No recurrent AF. Tolerating her medicines without issue and now are much more affordable. No chest pain. Breathing is fine. No  bleeding/bruising. She is getting outside and working in her yard. Not dizzy. No falls reported. BP is good. HR is steady.   Past Medical History:  Diagnosis Date  . Ankle bruise    SPONTANEOUS BRUISE ON RIGHT ANKLE  . Anxiety   . Complication of anesthesia   . GERD (gastroesophageal reflux disease)   . History of hiatal hernia   . Hyperlipidemia   . Hypertension   . Kidney cysts   . Liver cyst   . Liver cyst   . Lumbar stenosis   . LVH (left ventricular hypertrophy)    MILD LVH per echo in 2007  . Mitral valve regurgitation   . Obesity   . Palpitations   . PONV (postoperative nausea and vomiting)    Past Surgical History:  Procedure Laterality Date  . BREAST LUMPECTOMY Right 1995   RIGHT BREAST  . CHOLECYSTECTOMY    . ESOPHAGEAL MANOMETRY N/A 06/23/2017   Procedure: ESOPHAGEAL MANOMETRY (EM);  Surgeon: Juanita Craver, MD;  Location: [facility];  Service: Endoscopy;  Laterality: N/A;  . INSERTION OF MESH N/A 05/31/2018   Procedure: INSERTION OF MESH;  Surgeon: Ralene Ok, MD;  Location: [facility];  Service: General;  Laterality: N/A;  . LIPOMA EXCISION    . MENISCUS REPAIR Left   . US ECHOCARDIOGRAPHY  2007   SHOWED EF OF 55-60%     Current Meds  Medication Sig  . acetaminophen (TYLENOL) 500 MG tablet Take 500-1,000 mg by mouth every 8 (eight) hours as needed for mild pain or moderate pain.  Marland Kitchen apixaban (ELIQUIS) 5 MG TABS tablet Take 1 tablet (5 mg total) by mouth 2 (two) times daily.  . Calcium Citrate (CAL-CITRATE) 150 MG CAPS Take 2 capsules (300 mg total) by mouth daily.  . cetirizine (ZYRTEC) 10 MG tablet Take 10 mg by mouth daily.  . Collagen 500 MG CAPS Take 2 capsules (1,000 mg total) by mouth daily.  Marland Kitchen diltiazem (CARDIZEM CD) 180 MG 24 hr capsule Take 1 capsule (180 mg total) by mouth daily.  Marland Kitchen ELDERBERRY PO Take by mouth.  . fluticasone (FLONASE) 50 MCG/ACT nasal spray Place 2 sprays into both nostrils daily as needed for allergies. After sinus rinses  .  lisinopril-hydrochlorothiazide (PRINZIDE,ZESTORETIC) 20-25 MG tablet Take 1 tablet by mouth daily.  . montelukast (SINGULAIR) 10 MG tablet Take 1 tablet (10 mg total) by mouth at bedtime.  . Multiple Vitamins-Minerals (MULTI ADULT GUMMIES) CHEW Chew by mouth.  . pravastatin (PRAVACHOL) 20 MG tablet TAKE 1 TABLET BY MOUTH EVERYDAY AT BEDTIME  . sertraline (ZOLOFT) 25 MG tablet Take 1 tablet (25 mg total) by mouth daily.  . Vitamin D, Ergocalciferol, (DRISDOL) 50000 units CAPS capsule Take 1 capsule (50,000 Units total) by mouth every 7 (seven) days.     Allergies:   Darvon [propoxyphene]; Codeine; Sulfa drugs cross reactors; Bystolic [nebivolol hcl]; and Diovan [valsartan]   Social History   Tobacco Use  . Smoking status: Never Smoker  . Smokeless  tobacco: Never Used  Substance Use Topics  . Alcohol use: No  . Drug use: No     Family Hx: The patient's family history includes AAA (abdominal aortic aneurysm) in her father; Dementia in her mother; Heart disease in her mother; Heart disease (age of onset: 1) in her father; Hypertension (age of onset: 75) in her father.  ROS:   Please see the history of present illness.   All other systems reviewed are negative except for none.    Objective:    Vital Signs:  BP 132/70   Pulse 60   Wt 183 lb (83 kg)   BMI 27.83 kg/m    Wt Readings from Last 3 Encounters:  01/23/19 183 lb (83 kg)  12/19/18 183 lb (83 kg)  10/25/18 181 lb 3.2 oz (82.2 kg)    Well nourished, well developed female in no acute distress. She is quite pleasant. Converses appropriately. Not short of breath with conversation.    Labs/Other Tests and Data Reviewed:    Lab Results  Component Value Date   WBC 5.3 05/24/2018   HGB 13.1 05/24/2018   HCT 39.7 05/24/2018   PLT 408 (H) 05/24/2018   GLUCOSE 89 07/29/2018   CHOL 136 10/06/2018   TRIG 64 10/06/2018   HDL 52 10/06/2018   LDLCALC 71 10/06/2018   ALT 7 05/19/2018   AST 20 04/12/2018   NA 135 07/29/2018    K 4.4 07/29/2018   CL 97 07/29/2018   CREATININE 0.66 07/29/2018   BUN 19 07/29/2018   CO2 25 07/29/2018   TSH 2.250 04/12/2018   HGBA1C 5.6 10/06/2018   MICROALBUR 19.0 04/28/2016     BNP (last 3 results) No results for input(s): BNP in the last 8760 hours.  ProBNP (last 3 results) No results for input(s): PROBNP in the last 8760 hours.        Prior CV studies:    The following studies were reviewed today:  Notes Recorded by Wellington Hampshire, MD on 12/16/2014 at 12:54 PM Carotid Doppler showed minor atherosclerosis. The velocities are actually better than last year. Recommend repeat carotid Doppler in 2 years.    ASSESSMENT & PLAN:    1. PAF - prior lone episode - symptomatic - was initially placed on Eliquis and Multaq -ended up having discussion with Dr. Rayann Heman regarding options at her last visit - we stopped Multaq due to expense. Watchful waiting approach was opted.  She is now on CCB therapy with Cardizem 180 mg a day - she remains on her anticoagulation. Our plan was that is she was to have recurrence - could consider Tikosyn or Flecainide (no known CAD) as well as EP referral with [facility]. She is doing well. No recurrence. No change with current regimen. She is to see her PCP in about 2 weeks - will try to get her surveillance labs done at that time and send to me. She has no bleeding/bruising.   2. HTN - BP is good. No changes made today.   3. Carotid disease - behind on doppler study - will need to arrange after COVID 19 - will discuss on return visit.   4. HLD - on statin therapy. Last labs from December noted.   5. COVID-19 Education: The signs and symptoms of COVID-19 were discussed with the patient and how to seek care for testing (follow up with PCP or arrange E-visit).  The importance of social distancing, staying at home and hand hygiene were discussed today.  Patient  Risk:   After full review of this patient's clinical status, I feel that they are at  least moderate risk at this time.  Time:   Today, I have spent 15 minutes with the patient with telehealth technology discussing the above issues.     Medication Adjustments/Labs and Tests Ordered: Current medicines are reviewed at length with the patient today.  Concerns regarding medicines are outlined above.   Tests Ordered: No orders of the defined types were placed in this encounter.   Medication Changes: No orders of the defined types were placed in this encounter.   Disposition:  FU with me in 4 months.   Patient is agreeable to this plan and will call if any problems develop in the interim.   Amie Critchley, NP  01/23/2019 9:06 AM    [place]

## 2019-01-23 ENCOUNTER — Telehealth (INDEPENDENT_AMBULATORY_CARE_PROVIDER_SITE_OTHER): Payer: Medicare Other | Admitting: Nurse Practitioner

## 2019-01-23 ENCOUNTER — Other Ambulatory Visit: Payer: Self-pay

## 2019-01-23 VITALS — BP 132/70 | HR 60 | Wt 183.0 lb

## 2019-01-23 DIAGNOSIS — I480 Paroxysmal atrial fibrillation: Secondary | ICD-10-CM | POA: Diagnosis not present

## 2019-01-23 DIAGNOSIS — Z7901 Long term (current) use of anticoagulants: Secondary | ICD-10-CM

## 2019-01-23 DIAGNOSIS — I10 Essential (primary) hypertension: Secondary | ICD-10-CM

## 2019-01-23 DIAGNOSIS — Z79899 Other long term (current) drug therapy: Secondary | ICD-10-CM

## 2019-01-23 NOTE — Patient Instructions (Addendum)
After Visit Summary:  We will be checking the following labs today - NONE  See if you can get your labs from your PCP later this month - continue to monitor for any bleeding.    Medication Instructions:    Continue with your current medicines.    If you need a refill on your cardiac medications before your next appointment, please call your pharmacy.     Testing/Procedures To Be Arranged:  N/A  Follow-Up:   See me in August of 2020 - we will arrange your follow up carotid dopplers at that time.     At [facility], you and your health needs are our priority.  As part of our continuing mission to provide you with exceptional heart care, we have created designated Provider Care Teams.  These Care Teams include your primary Cardiologist (physician) and Advanced Practice Providers (APPs -  Physician Assistants and Nurse Practitioners) who all work together to provide you with the care you need, when you need it.  Special Instructions:  . Stay safe, stay home and wash your hands for at least 20 seconds!  Call the [place] office at [phone] if you have any questions, problems or concerns.

## 2019-02-06 ENCOUNTER — Ambulatory Visit: Payer: Medicare Other | Admitting: Family Medicine

## 2019-03-31 ENCOUNTER — Other Ambulatory Visit: Payer: Self-pay | Admitting: Family Medicine

## 2019-03-31 DIAGNOSIS — F419 Anxiety disorder, unspecified: Secondary | ICD-10-CM

## 2019-04-03 ENCOUNTER — Telehealth: Payer: Self-pay | Admitting: Family Medicine

## 2019-04-03 NOTE — Telephone Encounter (Signed)
Patient called request refill on :  sertraline (ZOLOFT) 25 MG tablet [[id number]]   Order Details Dose: 25 mg Route: Oral Frequency: Daily  Dispense Quantity: 90 tablet Refills: 3 Fills remaining: --        Sig: Take 1 tablet (25 mg total) by mouth daily.           ----Patient also states she thinks its time for labwork, unable to locate any order for labs, but forwarding message to medical assistant for review of necessity & to contact patient w/ update.  ---- Send refill order to :   [id number] [name], [place] [phone] (Phone) [phone] (Fax)   --glh

## 2019-04-04 ENCOUNTER — Telehealth: Payer: Self-pay

## 2019-04-04 NOTE — Telephone Encounter (Signed)
Refill has been sent in and a message to Dr. Raliegh Scarlet to advise on if labs are needed for the patient. MPulliam, CMA/RT(R)

## 2019-04-04 NOTE — Telephone Encounter (Signed)
Last saw patient on 10/05/2018 in the office.  And labs were done on 10/06/2018.  Things were completely stable then and this was 6 months ago.  There is no need to recheck things now since we did not make a change in her medications-unless we find patient has new symptoms or has changed medications on her own, no labs are warranted right now.  -Administering the office visit with her we find new symptoms or reason to obtain labs now, there is nothing that I can foresee needs to be drawn prior to her office visit with me.  Thanks

## 2019-04-04 NOTE — Telephone Encounter (Signed)
Patient notified. MPulliam, CMA/RT(R)  

## 2019-04-04 NOTE — Telephone Encounter (Signed)
Patient has a follow up appointment on 05/01/2019 and would like to know if labwork is needed before the appointment.  Lipids, A1C, and Vit D was last checked 10/06/18 and full set done 05/19/2018.  Please review and advise if labs are needed at this time. MPulliam, CMA/RT(R)

## 2019-04-10 ENCOUNTER — Other Ambulatory Visit: Payer: Self-pay | Admitting: Family Medicine

## 2019-04-11 DIAGNOSIS — M47816 Spondylosis without myelopathy or radiculopathy, lumbar region: Secondary | ICD-10-CM | POA: Diagnosis not present

## 2019-04-11 DIAGNOSIS — S32009K Unspecified fracture of unspecified lumbar vertebra, subsequent encounter for fracture with nonunion: Secondary | ICD-10-CM | POA: Diagnosis not present

## 2019-04-11 DIAGNOSIS — M5136 Other intervertebral disc degeneration, lumbar region: Secondary | ICD-10-CM | POA: Diagnosis not present

## 2019-04-11 DIAGNOSIS — Z981 Arthrodesis status: Secondary | ICD-10-CM | POA: Diagnosis not present

## 2019-04-13 ENCOUNTER — Other Ambulatory Visit: Payer: Self-pay | Admitting: Family Medicine

## 2019-04-13 DIAGNOSIS — I10 Essential (primary) hypertension: Secondary | ICD-10-CM

## 2019-05-01 ENCOUNTER — Telehealth: Payer: Self-pay | Admitting: Nurse Practitioner

## 2019-05-01 ENCOUNTER — Other Ambulatory Visit: Payer: Self-pay | Admitting: *Deleted

## 2019-05-01 ENCOUNTER — Other Ambulatory Visit: Payer: Self-pay

## 2019-05-01 ENCOUNTER — Ambulatory Visit (INDEPENDENT_AMBULATORY_CARE_PROVIDER_SITE_OTHER): Payer: Medicare Other | Admitting: Family Medicine

## 2019-05-01 ENCOUNTER — Encounter: Payer: Self-pay | Admitting: Family Medicine

## 2019-05-01 VITALS — BP 135/81 | HR 56 | Temp 97.6°F | Ht 68.0 in | Wt 181.8 lb

## 2019-05-01 DIAGNOSIS — Z79899 Other long term (current) drug therapy: Secondary | ICD-10-CM

## 2019-05-01 DIAGNOSIS — R7303 Prediabetes: Secondary | ICD-10-CM | POA: Diagnosis not present

## 2019-05-01 DIAGNOSIS — I6529 Occlusion and stenosis of unspecified carotid artery: Secondary | ICD-10-CM

## 2019-05-01 DIAGNOSIS — E7849 Other hyperlipidemia: Secondary | ICD-10-CM

## 2019-05-01 DIAGNOSIS — E559 Vitamin D deficiency, unspecified: Secondary | ICD-10-CM | POA: Diagnosis not present

## 2019-05-01 DIAGNOSIS — I10 Essential (primary) hypertension: Secondary | ICD-10-CM

## 2019-05-01 DIAGNOSIS — R7309 Other abnormal glucose: Secondary | ICD-10-CM | POA: Diagnosis not present

## 2019-05-01 DIAGNOSIS — M810 Age-related osteoporosis without current pathological fracture: Secondary | ICD-10-CM

## 2019-05-01 NOTE — Progress Notes (Signed)
Impression and Recommendations:    1. Other hyperlipidemia   2. Vitamin D insufficiency   3. Elevated hemoglobin A1c measurement   4. Age related osteoporosis, unspecified pathological fracture presence   5. Essential hypertension   6. Prediabetes   7. High risk medication use     Other hyperlipidemia - Plan: Lipid panel, Comprehensive metabolic panel, CBC with Differential/Platelet, Lipid panel  Vitamin D insufficiency - Plan: CBC with Differential/Platelet, VITAMIN D 25 Hydroxy (Vit-D Deficiency, Fractures)  Elevated hemoglobin A1c measurement - Plan: Comprehensive metabolic panel, CBC with Differential/Platelet  Age related osteoporosis, unspecified pathological fracture presence - Plan: CBC with Differential/Platelet  Essential hypertension - Plan: Comprehensive metabolic panel, CBC with Differential/Platelet  Prediabetes - Plan: Hemoglobin A1c, CBC with Differential/Platelet, Hemoglobin A1c  High risk medication use - Plan: Lipid panel, Hemoglobin A1c, Comprehensive metabolic panel, CBC with Differential/Platelet, Lipid panel, Hemoglobin A1c, CBC with Differential/Platelet, Comprehensive metabolic panel, VITAMIN D 25 Hydroxy (Vit-D Deficiency, Fractures), TSH, T4, free, Phosphorus, Magnesium,    Orders Placed This Encounter  Procedures  . Lipid panel  . Hemoglobin A1c  . Comprehensive metabolic panel  . CBC with Differential/Platelet  . Lipid panel  . Hemoglobin A1c  . CBC with Differential/Platelet  . Comprehensive metabolic panel  . VITAMIN D 25 Hydroxy (Vit-D Deficiency, Fractures)  . TSH  . T4, free  . Phosphorus  . Magnesium    Medications Discontinued During This Encounter  Medication Reason  . Calcium Citrate (CAL-CITRATE) 150 MG CAPS     Gross side effects, risk and benefits, and alternatives of medications and treatment plan in general discussed with patient.  Patient is aware that all medications have potential side effects and we are unable to  predict every side effect or drug-drug interaction that may occur.   Patient will call with any questions prior to using medication if they have concerns.    Expresses verbal understanding and consents to current therapy and treatment regimen.  No barriers to understanding were identified.  Red flag symptoms and signs discussed in detail.  Patient expressed understanding regarding what to do in case of emergency\urgent symptoms  Please see AVS handed out to patient at the end of our visit for further patient instructions/ counseling done pertaining to today's office visit.   Return if symptoms arise prior, for 4 months or sooner if issues.     Note:  This note was prepared with assistance of Dragon voice recognition software. Occasional wrong-word or sound-a-like substitutions may have occurred due to the inherent limitations of voice recognition software.  Marjory Sneddon, DO 05/01/2019 10:47 AM    --------------------------------------------------------------------------------------------------------------------------------------------------------------------------------------------------------------------------------------------    Subjective:     HPI: Carla Lewis is a 73 y.o. female who presents to [place] at [facility] today for issues as discussed below.   Patient without complaints today other than her bilateral lower extremities have been a little bit swollen.  They have been standing on her feet for 12 to 14 hours/day packing and moving into a new home for the past several weeks.  Finally moved in a couple of weeks ago and patient is back to wearing her TED hose/support stockings.  Symptoms have improved now.  Patient denies any chest pain, shortness of breath, orthopnea, paroxysmal nocturnal dyspnea.    GERD: Nonexistent and well controlled.  Mood: Has been well controlled on the 25 sertraline.  Patient denies need for change.  Blood pressure:  Well controlled at home.  -For back  surgery, Dr. Sherwood Gambler has released her.  She does has concerns about her calcium and vitamin D and preventing osteoporosis.  Last bone density was done 06/2018.  Explained insurance will pay every 2 years.  She continues on vitamin D supplement and last checked 6 months ago was at 29.  Well controlled.   Wt Readings from Last 3 Encounters:  05/29/19 185 lb 6.4 oz (84.1 kg)  05/01/19 181 lb 12.8 oz (82.5 kg)  01/23/19 183 lb (83 kg)   BP Readings from Last 3 Encounters:  05/29/19 120/72  05/01/19 135/81  01/23/19 132/70   Pulse Readings from Last 3 Encounters:  05/29/19 61  05/01/19 (!) 56  01/23/19 60   BMI Readings from Last 3 Encounters:  05/29/19 28.19 kg/m  05/01/19 27.64 kg/m  01/23/19 27.83 kg/m     Patient Care Team    Relationship Specialty Notifications Start End  Mellody Dance, DO PCP - General Family Medicine  08/11/16   Martinique, Peter M, MD Consulting Physician Cardiology  08/11/16   Elsie Saas, MD Consulting Physician Orthopedic Surgery  08/11/16   Juanita Craver, MD Consulting Physician Gastroenterology  08/11/16   Paula Compton, MD Consulting Physician Obstetrics and Gynecology  08/11/16   Specialists, Dermatology  Dermatology  02/28/17    Comment: yrly exams there  Jackolyn Confer, MD Consulting Physician General Surgery  05/18/17   Jovita Gamma, MD Consulting Physician Neurosurgery  10/25/18      Patient Active Problem List   Diagnosis Date Noted  . Carotid stenosis 12/19/2013    Priority: High  . Anxiety     Priority: High  . Hypertension     Priority: High  . Hyperlipidemia     Priority: High  . Age related osteoporosis 07/29/2018    Priority: Medium  . S/P Nissen fundoplication (without gastrostomy tube) procedure 05/31/2018    Priority: Medium  . Environmental and seasonal allergies 08/11/2016    Priority: Medium  . GERD (gastroesophageal reflux disease)     Priority: Medium  . Vitamin D  insufficiency 08/11/2016    Priority: Low  . Overweight (BMI 25.0-29.9) 08/11/2016    Priority: Low  . Atrial fibrillation with RVR ([place]) 12/13/2018  . Nonrheumatic mitral valve regurgitation 12/13/2018  . Elevated hemoglobin A1c measurement 10/25/2018  . Viral URI with cough 10/25/2018  . h/o Hyponatremia- 128 on 8/19 07/29/2018  . Chronic pansinusitis 04/12/2018  . 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 Excessive Burping 02/23/2017  . Dysphagia 02/23/2017  . h/o Palpitations   . Liver cyst   . LVH (left ventricular hypertrophy)     Past Medical history, Surgical history, Family history, Social history, Allergies and Medications have been entered into the medical record, reviewed and changed as needed.    No outpatient medications have been marked as taking for the 05/01/19 encounter (Office Visit) with Mellody Dance, DO.    Allergies:  Allergies  Allergen Reactions  . Darvon [Propoxyphene] Anaphylaxis  . Codeine     UNSPECIFIED REACTION   . Sulfa Drugs Cross Reactors     UNSPECIFIED REACTION   . Bystolic [Nebivolol Hcl] Other (See Comments)    No energy and lightheadedness.  . Diovan [Valsartan] Anxiety     Review of Systems:  A fourteen system review of systems was performed and found to be positive as per HPI.   Objective:   Blood pressure 135/81, pulse (!) 56, temperature 97.6 F (36.4 C), height 5\' 8"  (1.727 m), weight 181 lb 12.8 oz (82.5  kg), SpO2 98 %. Body mass index is 27.64 kg/m. General:  Well Developed, well nourished, appropriate for stated age.  Neuro:  Alert and oriented,  extra-ocular muscles intact  HEENT:  Normocephalic, atraumatic, neck supple, no carotid bruits appreciated  Skin:  no gross rash, warm, pink. Cardiac:  RRR, S1 S2 Respiratory:  ECTA B/L and A/P, Not using accessory muscles, speaking in full sentences- unlabored. Vascular:  Ext warm, no cyanosis apprec.; cap RF less 2 sec. Psych:  No HI/SI, judgement and  insight good, Euthymic mood. Full Affect.

## 2019-05-01 NOTE — Telephone Encounter (Signed)
Pt called in today due to having a scheduled appt at Dr. Kayren Eaves office today and pt stated Cecille Rubin wanted to check a lft's.  Did not see this noted in chart but added to draw at [facility] office.  [facility]'s office is checking A1C/lipid.  Will send to [place] to [place].

## 2019-05-01 NOTE — Patient Instructions (Signed)

## 2019-05-01 NOTE — Telephone Encounter (Signed)
°  Patient wants to know if she needs to have labs drawn before her appt on 08/10

## 2019-05-02 LAB — CBC WITH DIFFERENTIAL/PLATELET
Basophils Absolute: 0.1 10*3/uL (ref 0.0–0.2)
Basos: 2 %
EOS (ABSOLUTE): 0.1 10*3/uL (ref 0.0–0.4)
Eos: 1 %
Hematocrit: 40.8 % (ref 34.0–46.6)
Hemoglobin: 13.6 g/dL (ref 11.1–15.9)
Immature Grans (Abs): 0 10*3/uL (ref 0.0–0.1)
Immature Granulocytes: 0 %
Lymphocytes Absolute: 2 10*3/uL (ref 0.7–3.1)
Lymphs: 35 %
MCH: 29.1 pg (ref 26.6–33.0)
MCHC: 33.3 g/dL (ref 31.5–35.7)
MCV: 87 fL (ref 79–97)
Monocytes Absolute: 0.5 10*3/uL (ref 0.1–0.9)
Monocytes: 8 %
Neutrophils Absolute: 3.1 10*3/uL (ref 1.4–7.0)
Neutrophils: 54 %
Platelets: 342 10*3/uL (ref 150–450)
RBC: 4.67 x10E6/uL (ref 3.77–5.28)
RDW: 12.5 % (ref 11.7–15.4)
WBC: 5.7 10*3/uL (ref 3.4–10.8)

## 2019-05-02 LAB — VITAMIN D 25 HYDROXY (VIT D DEFICIENCY, FRACTURES): Vit D, 25-Hydroxy: 77.3 ng/mL (ref 30.0–100.0)

## 2019-05-02 LAB — LIPID PANEL
Chol/HDL Ratio: 2.3 ratio (ref 0.0–4.4)
Cholesterol, Total: 124 mg/dL (ref 100–199)
HDL: 54 mg/dL (ref >39)
LDL Calculated: 58 mg/dL (ref 0–99)
Triglycerides: 59 mg/dL (ref 0–149)
VLDL Cholesterol Cal: 12 mg/dL (ref 5–40)

## 2019-05-02 LAB — COMPREHENSIVE METABOLIC PANEL
ALT: 23 IU/L (ref 0–32)
AST: 35 IU/L (ref 0–40)
Albumin/Globulin Ratio: 1.7 (ref 1.2–2.2)
Albumin: 4 g/dL (ref 3.7–4.7)
Alkaline Phosphatase: 92 IU/L (ref 39–117)
BUN/Creatinine Ratio: 28 (ref 12–28)
BUN: 20 mg/dL (ref 8–27)
Bilirubin Total: 0.4 mg/dL (ref 0.0–1.2)
CO2: 24 mmol/L (ref 20–29)
Calcium: 9.4 mg/dL (ref 8.7–10.3)
Chloride: 99 mmol/L (ref 96–106)
Creatinine, Ser: 0.71 mg/dL (ref 0.57–1.00)
GFR calc Af Amer: 98 mL/min/{1.73_m2} (ref >59)
GFR calc non Af Amer: 85 mL/min/{1.73_m2} (ref >59)
Globulin, Total: 2.4 g/dL (ref 1.5–4.5)
Glucose: 91 mg/dL (ref 65–99)
Potassium: 4.4 mmol/L (ref 3.5–5.2)
Sodium: 137 mmol/L (ref 134–144)
Total Protein: 6.4 g/dL (ref 6.0–8.5)

## 2019-05-02 LAB — PHOSPHORUS: Phosphorus: 3.7 mg/dL (ref 3.0–4.3)

## 2019-05-02 LAB — T4, FREE: Free T4: 1.22 ng/dL (ref 0.82–1.77)

## 2019-05-02 LAB — TSH: TSH: 1.76 u[IU]/mL (ref 0.450–4.500)

## 2019-05-02 LAB — MAGNESIUM: Magnesium: 2.2 mg/dL (ref 1.6–2.3)

## 2019-05-02 LAB — HEMOGLOBIN A1C
Est. average glucose Bld gHb Est-mCnc: 117 mg/dL
Hgb A1c MFr Bld: 5.7 % — ABNORMAL HIGH (ref 4.8–5.6)

## 2019-05-27 NOTE — Progress Notes (Signed)
CARDIOLOGY OFFICE NOTE  Date:  05/29/2019    Carla Lewis Date of Birth: [birth year] Medical Record #[id number]  PCP:  Mellody Dance, DO  Cardiologist:  Gle[place]    Chief Complaint  Patient presents with   Follow-up    Follow up visit - seen for Dr. Martinique & Allred    History of Present Illness: Carla Lewis is a 73 y.o. female who presents today for a 3 month check. Seen for Dr. Martinique.Has primarily followed with me.  She has HTN with LVH. She has been intolerant to Bystolic and to Diovan.   Other issues include HLD, GERD, allergies and depression/anxiety. Dr. Dagmar Hait usually follows her labs. She has minimal nonobstructive plaque on carotid duplex from February of 2015 and has seen Dr. Fletcher Anon for review. She had a repeat carotid Doppler in February of 2015 which showed mild atherosclerosis in the left side with less than 39% stenosis and increased velocity on the right side suggestive of 60-79% stenosis although no obvious stenosis was noted. Last carotids from 12/2014 actually improved and he recommended repeat study in 2018. Last Myoview was from 2010.  I have followed her over the years - she has had more issues with labile BP - typically with stress/anxiety issues. CV risk factor modification is a struggle at times.  She was lost to follow up for a few years - had L4/L5 surgery with Dr. Sherwood Gambler in January of 2019, followed by hiatal hernia repair in July of 2019.I then saw her back to reestablish in March of this year - she had had PAF documented - occurred while in [place] - placed on Multaq and Eliquis - converted just prior to having TEE/cardioversion. Echo with moderate MR and mild to moderate TR on TTE. EF was over 70%. She was doing well at our visit - cost of Multaq was quite high - I spoke with Dr. Rayann Heman who also saw here with me at that visit -we elected to stop the Multaq and manage with just CCB and continued anticoagulation  with Eliquis. No real indication for sleep study. She had not had recurrent AF.   CHADSVASC is 72 (age, gender, HTN and PAD)  Last seen in early April for telehealth visit - doing well.   The patient does not have symptoms concerning for COVID-19 infection (fever, chills, cough, or new shortness of breath).   Comes in today. Here alone. She is doing well. No chest pain. Breathing is good. No AF that she is aware of. Lab from her PCP last month noted - copy given to patient. No bleeding/bruising. She feels like she is doing well. Just finished a remodel/new build that she is very excited about.   Past Medical History:  Diagnosis Date   Ankle bruise    SPONTANEOUS BRUISE ON RIGHT ANKLE   Anxiety    Complication of anesthesia    GERD (gastroesophageal reflux disease)    History of hiatal hernia    Hyperlipidemia    Hypertension    Kidney cysts    Liver cyst    Liver cyst    Lumbar stenosis    LVH (left ventricular hypertrophy)    MILD LVH per echo in 2007   Mitral valve regurgitation    Obesity    Palpitations    PONV (postoperative nausea and vomiting)     Past Surgical History:  Procedure Laterality Date   BREAST LUMPECTOMY Right 1995   RIGHT BREAST   CHOLECYSTECTOMY  ESOPHAGEAL MANOMETRY N/A 06/23/2017   Procedure: ESOPHAGEAL MANOMETRY (EM);  Surgeon: Juanita Craver, MD;  Location: [facility];  Service: Endoscopy;  Laterality: N/A;   INSERTION OF MESH N/A 05/31/2018   Procedure: INSERTION OF MESH;  Surgeon: Ralene Ok, MD;  Location: [facility];  Service: General;  Laterality: N/A;   LIPOMA EXCISION     MENISCUS REPAIR Left    US ECHOCARDIOGRAPHY  2007   SHOWED EF OF 55-60%     Medications: Current Meds  Medication Sig   acetaminophen (TYLENOL) 500 MG tablet Take 500-1,000 mg by mouth every 8 (eight) hours as needed for mild pain or moderate pain.   apixaban (ELIQUIS) 5 MG TABS tablet Take 1 tablet (5 mg total) by mouth 2 (two) times  daily.   cetirizine (ZYRTEC) 10 MG tablet Take 10 mg by mouth daily.   Collagen 500 MG CAPS Take 2 capsules (1,000 mg total) by mouth daily.   ELDERBERRY PO Take by mouth.   fluticasone (FLONASE) 50 MCG/ACT nasal spray Place 2 sprays into both nostrils daily as needed for allergies. After sinus rinses   lisinopril-hydrochlorothiazide (ZESTORETIC) 20-25 MG tablet TAKE 1 TABLET BY MOUTH EVERY DAY   montelukast (SINGULAIR) 10 MG tablet Take 1 tablet (10 mg total) by mouth at bedtime.   Multiple Vitamins-Minerals (MULTI ADULT GUMMIES) CHEW Chew by mouth.   pravastatin (PRAVACHOL) 20 MG tablet TAKE 1 TABLET BY MOUTH EVERYDAY AT BEDTIME   sertraline (ZOLOFT) 25 MG tablet TAKE 1 TABLET BY MOUTH EVERY DAY   Vitamin D, Ergocalciferol, (DRISDOL) 50000 units CAPS capsule Take 1 capsule (50,000 Units total) by mouth every 7 (seven) days.     Allergies: Allergies  Allergen Reactions   Darvon [Propoxyphene] Anaphylaxis   Codeine     UNSPECIFIED REACTION    Sulfa Drugs Cross Reactors     UNSPECIFIED REACTION    Bystolic [Nebivolol Hcl] Other (See Comments)    No energy and lightheadedness.   Diovan [Valsartan] Anxiety    Social History: The patient  reports that she has never smoked. She has never used smokeless tobacco. She reports that she does not drink alcohol or use drugs.   Family History: The patient's family history includes AAA (abdominal aortic aneurysm) in her father; Dementia in her mother; Heart disease in her mother; Heart disease (age of onset: 75) in her father; Hypertension (age of onset: 18) in her father.   Review of Systems: Please see the history of present illness.   All other systems are reviewed and negative.   Physical Exam: VS:  BP 120/72    Pulse 61    Ht 5\' 8"  (1.727 m)    Wt 185 lb 6.4 oz (84.1 kg)    SpO2 98%    BMI 28.19 kg/m  .  BMI Body mass index is 28.19 kg/m.  Wt Readings from Last 3 Encounters:  05/29/19 185 lb 6.4 oz (84.1 kg)  05/01/19  181 lb 12.8 oz (82.5 kg)  01/23/19 183 lb (83 kg)    General: Pleasant. Well developed, well nourished and in no acute distress.   HEENT: Normal.  Neck: Supple, no JVD, carotid bruits, or masses noted.  Cardiac: Regular rate and rhythm. No real murmur that I appreciate. No edema.  Respiratory:  Lungs are clear to auscultation bilaterally with normal work of breathing.  GI: Soft and nontender.  MS: No deformity or atrophy. Gait and ROM intact.  Skin: Warm and dry. Color is normal.  Neuro:  Strength and sensation  are intact and no gross focal deficits noted.  Psych: Alert, appropriate and with normal affect.   LABORATORY DATA:  EKG:  EKG is not ordered today.  Lab Results  Component Value Date   WBC 5.7 05/01/2019   HGB 13.6 05/01/2019   HCT 40.8 05/01/2019   PLT 342 05/01/2019   GLUCOSE 91 05/01/2019   CHOL 124 05/01/2019   TRIG 59 05/01/2019   HDL 54 05/01/2019   LDLCALC 58 05/01/2019   ALT 23 05/01/2019   AST 35 05/01/2019   NA 137 05/01/2019   K 4.4 05/01/2019   CL 99 05/01/2019   CREATININE 0.71 05/01/2019   BUN 20 05/01/2019   CO2 24 05/01/2019   TSH 1.760 05/01/2019   HGBA1C 5.7 (H) 05/01/2019   MICROALBUR 19.0 04/28/2016     BNP (last 3 results) No results for input(s): BNP in the last 8760 hours.  ProBNP (last 3 results) No results for input(s): PROBNP in the last 8760 hours.   Other Studies Reviewed Today:        Notes Recorded by Wellington Hampshire, MD on 12/16/2014 at 12:54 PM Carotid Doppler showed minor atherosclerosis. The velocities are actually better than last year. Recommend repeat carotid Doppler in 2 years.    ASSESSMENT & PLAN:    1.PAF - prior lone episode back in February - symptomatic and in the setting of lots of stressors - was initially placed on Eliquis and Multaq -ended up having discussion with Dr. Rayann Heman regarding options at her last visit - we stopped Multaq due to expense. Watchful waiting approach was opted.  She  is now on CCB therapy with Cardizem 180 mg a day - she remains on her anticoagulation. Our plan was that is she was to have recurrence - could consider Tikosyn or Flecainide (no known CAD) as well as EP referral with [facility].   She continues to do well. No recurrence. Remains on Eliquis. Labs from last month look good. No changes made today.   2. HTN - BP looks fine. No changes made today.   3. Carotid disease - behind on doppler study - will need to arrangeafter COVID 19 - we will arrange repeat study.    4. HLD -on statin therapy.Labs from July noted.   5. Moderate MR - this will need to be followed - we will repeat on return visit in February 2020.   6. COVID-19 Education: The signs and symptoms of COVID-19 were discussed with the patient and how to seek care for testing (follow up with PCP or arrange E-visit).  The importance of social distancing, staying at home, hand hygiene and wearing a mask when out in public were discussed today.  Current medicines are reviewed with the patient today.  The patient does not have concerns regarding medicines other than what has been noted above.  The following changes have been made:  See above.  Labs/ tests ordered today include:    Orders Placed This Encounter  Procedures   ECHOCARDIOGRAM COMPLETE     Disposition:   FU with me after echo in 6 months.   Patient is agreeable to this plan and will call if any problems develop in the interim.   SignedTruitt Merle, NP  05/29/2019 10:15 AM  [facility] [address] [place], [place]  [address] Phone: [phone] Fax: [phone]

## 2019-05-29 ENCOUNTER — Ambulatory Visit (INDEPENDENT_AMBULATORY_CARE_PROVIDER_SITE_OTHER): Payer: Medicare Other | Admitting: Nurse Practitioner

## 2019-05-29 ENCOUNTER — Encounter: Payer: Self-pay | Admitting: Nurse Practitioner

## 2019-05-29 ENCOUNTER — Other Ambulatory Visit: Payer: Self-pay

## 2019-05-29 VITALS — BP 120/72 | HR 61 | Ht 68.0 in | Wt 185.4 lb

## 2019-05-29 DIAGNOSIS — I340 Nonrheumatic mitral (valve) insufficiency: Secondary | ICD-10-CM | POA: Diagnosis not present

## 2019-05-29 DIAGNOSIS — Z7901 Long term (current) use of anticoagulants: Secondary | ICD-10-CM | POA: Diagnosis not present

## 2019-05-29 DIAGNOSIS — Z79899 Other long term (current) drug therapy: Secondary | ICD-10-CM | POA: Diagnosis not present

## 2019-05-29 DIAGNOSIS — I6529 Occlusion and stenosis of unspecified carotid artery: Secondary | ICD-10-CM | POA: Diagnosis not present

## 2019-05-29 DIAGNOSIS — I480 Paroxysmal atrial fibrillation: Secondary | ICD-10-CM

## 2019-05-29 DIAGNOSIS — I10 Essential (primary) hypertension: Secondary | ICD-10-CM

## 2019-05-29 NOTE — Patient Instructions (Addendum)
After Visit Summary:  We will be checking the following labs today - NONE   Medication Instructions:    Continue with your current medicines.    If you need a refill on your cardiac medications before your next appointment, please call your pharmacy.     Testing/Procedures To Be Arranged:  Echocardiogram in 6 months  Carotid doppler study  Follow-Up:   See me in 6 months - after the echo.     At [facility], you and your health needs are our priority.  As part of our continuing mission to provide you with exceptional heart care, we have created designated Provider Care Teams.  These Care Teams include your primary Cardiologist (physician) and Advanced Practice Providers (APPs -  Physician Assistants and Nurse Practitioners) who all work together to provide you with the care you need, when you need it.  Special Instructions:  . Stay safe, stay home, wash your hands for at least 20 seconds and wear a mask when out in public.  . It was good to talk with you today.    Call the [place] office at [phone] if you have any questions, problems or concerns.

## 2019-06-07 ENCOUNTER — Other Ambulatory Visit: Payer: Self-pay

## 2019-06-07 ENCOUNTER — Ambulatory Visit (HOSPITAL_COMMUNITY)
Admission: RE | Admit: 2019-06-07 | Discharge: 2019-06-07 | Disposition: A | Discharge status: Home / Self Care | Payer: Medicare Other | Source: Ambulatory Visit | Attending: Cardiovascular Disease | Admitting: Cardiovascular Disease

## 2019-06-07 DIAGNOSIS — I6529 Occlusion and stenosis of unspecified carotid artery: Secondary | ICD-10-CM | POA: Insufficient documentation

## 2019-07-07 ENCOUNTER — Other Ambulatory Visit: Payer: Self-pay | Admitting: Family Medicine

## 2019-07-26 ENCOUNTER — Other Ambulatory Visit: Payer: Self-pay | Admitting: Family Medicine

## 2019-07-26 DIAGNOSIS — E559 Vitamin D deficiency, unspecified: Secondary | ICD-10-CM

## 2019-08-24 ENCOUNTER — Other Ambulatory Visit: Payer: Self-pay | Admitting: Family Medicine

## 2019-08-24 DIAGNOSIS — J3089 Other allergic rhinitis: Secondary | ICD-10-CM

## 2019-08-24 MED ORDER — MONTELUKAST SODIUM 10 MG PO TABS: 10.0000 mg | ORAL_TABLET | Freq: Every day | ORAL | 0 refills | Status: DC

## 2019-08-24 NOTE — Telephone Encounter (Signed)
Patient refill request sent via fax. AS< CMA

## 2019-09-04 ENCOUNTER — Other Ambulatory Visit: Payer: Self-pay | Admitting: Family Medicine

## 2019-09-04 DIAGNOSIS — Z1231 Encounter for screening mammogram for malignant neoplasm of breast: Secondary | ICD-10-CM

## 2019-09-16 ENCOUNTER — Other Ambulatory Visit: Payer: Self-pay | Admitting: Family Medicine

## 2019-09-16 DIAGNOSIS — J3089 Other allergic rhinitis: Secondary | ICD-10-CM

## 2019-09-20 ENCOUNTER — Telehealth: Payer: Self-pay | Admitting: Family Medicine

## 2019-09-20 ENCOUNTER — Other Ambulatory Visit: Payer: Self-pay | Admitting: Family Medicine

## 2019-09-20 DIAGNOSIS — J3089 Other allergic rhinitis: Secondary | ICD-10-CM

## 2019-09-20 NOTE — Telephone Encounter (Signed)
Can you please contact patient to schedule telehealth or doxy follow up per Dr. Raliegh Scarlet last note and for further med refills. AS, CMA

## 2019-09-29 ENCOUNTER — Other Ambulatory Visit: Payer: Self-pay | Admitting: Family Medicine

## 2019-09-29 DIAGNOSIS — F419 Anxiety disorder, unspecified: Secondary | ICD-10-CM

## 2019-10-06 ENCOUNTER — Telehealth: Payer: Self-pay

## 2019-10-06 ENCOUNTER — Other Ambulatory Visit: Payer: Self-pay | Admitting: Family Medicine

## 2019-10-06 DIAGNOSIS — I10 Essential (primary) hypertension: Secondary | ICD-10-CM

## 2019-10-06 NOTE — Telephone Encounter (Signed)
Please call pt to schedule appt.  No further refills until pt is seen.  T. Sahid Borba, CMA  

## 2019-10-17 ENCOUNTER — Telehealth: Payer: Self-pay | Admitting: Family Medicine

## 2019-10-17 NOTE — Telephone Encounter (Signed)
Patient left message to contact PCP office to set up OV/Telehealth for Rx refills..  --FYI to med asst.  --glh

## 2019-10-19 ENCOUNTER — Other Ambulatory Visit: Payer: Self-pay | Admitting: Family Medicine

## 2019-10-19 DIAGNOSIS — J3089 Other allergic rhinitis: Secondary | ICD-10-CM

## 2019-10-26 DIAGNOSIS — Z79899 Other long term (current) drug therapy: Secondary | ICD-10-CM | POA: Diagnosis not present

## 2019-10-26 DIAGNOSIS — E7800 Pure hypercholesterolemia, unspecified: Secondary | ICD-10-CM | POA: Diagnosis not present

## 2019-10-26 DIAGNOSIS — R7303 Prediabetes: Secondary | ICD-10-CM | POA: Diagnosis not present

## 2019-10-26 DIAGNOSIS — E559 Vitamin D deficiency, unspecified: Secondary | ICD-10-CM | POA: Diagnosis not present

## 2019-10-30 ENCOUNTER — Other Ambulatory Visit: Payer: Self-pay | Admitting: Family Medicine

## 2019-10-30 ENCOUNTER — Ambulatory Visit
Admission: RE | Admit: 2019-10-30 | Discharge: 2019-10-30 | Disposition: A | Discharge status: Home / Self Care | Payer: Medicare Other | Source: Ambulatory Visit | Attending: Family Medicine | Admitting: Family Medicine

## 2019-10-30 ENCOUNTER — Other Ambulatory Visit: Payer: Self-pay

## 2019-10-30 DIAGNOSIS — Z1231 Encounter for screening mammogram for malignant neoplasm of breast: Secondary | ICD-10-CM

## 2019-11-29 ENCOUNTER — Ambulatory Visit (HOSPITAL_COMMUNITY): Payer: Medicare Other | Attending: Internal Medicine

## 2019-11-29 ENCOUNTER — Other Ambulatory Visit: Payer: Self-pay

## 2019-11-29 DIAGNOSIS — I340 Nonrheumatic mitral (valve) insufficiency: Secondary | ICD-10-CM | POA: Diagnosis not present

## 2019-12-07 NOTE — Progress Notes (Signed)
CARDIOLOGY OFFICE NOTE  Date:  12/13/2019    Carla Lewis Date of Birth: Sep 14, 1946 Medical Record [id number]  PCP:  Leonides Sake, MD  Cardiologist:  Eaven Schwager & Martinique    Chief Complaint  Patient presents with  . Follow-up    History of Present Illness: Carla Lewis is a 74 y.o. female who presents today for a 6 month check.  Seen for Dr. Martinique.Has primarily followed with me.  She has HTN with LVH. Shehas beenintolerant to Bystolic and to Diovan.  Other issues include HLD, GERD, allergies and depression/anxiety. PCP follows her labs. She has minimal nonobstructive plaque on carotid duplex from February of 2015 and has seen Dr. Fletcher Anon for review. She had a repeat carotid Doppler in February of 2015 which showed mild atherosclerosis in the left side with less than 39% stenosis and increased velocity on the right side suggestive of 60-79% stenosis although no obvious stenosis was noted. Last carotids from 12/2014 actually improved and he recommended repeat study in 2018. Last Myoview was from 2010.  I have followed her over the years - she has had more issues with labile BP - typically with stress/anxiety issues. CV risk factor modification is a struggle at times.  She was lost to follow up for a few years - had L4/L5 surgery with Dr. Sherwood Gambler in January of 2019, followed by hiatal hernia repair in July of 2019.I then saw her back to reestablish in March of 2020 - she had had PAF documented - occurred while in [place] - placed on Multaq and Eliquis - converted just prior to having TEE/cardioversion. Echo with moderate MR and mild to moderate TR on TTE. EF was over 70%. Was doing well on follow up - cost of Multaq was quite high - I spoke with Dr. Rayann Heman who also saw here with me at that visit - we elected to stop the Multaq and manage with just CCB and continued anticoagulation with Eliquis. No real indication for sleep study. She has not had  recurrent AF.  CHADSVASC is 26 (age, gender, HTN and PAD)  Last seen in August and was doing well.   The patient does not have symptoms concerning for COVID-19 infection (fever, chills, cough, or new shortness of breath).   Comes in today. Here alone. She is doing "great". Enjoying her new house and her grandkids. No chest pain. Breathing is good. Not dizzy. Rhythm has been good. She had labs last month - these are reviewed off the [place]. Overall, she feels like she is doing well and has no concerns.   Past Medical History:  Diagnosis Date  . Ankle bruise    SPONTANEOUS BRUISE ON RIGHT ANKLE  . Anxiety   . Complication of anesthesia   . GERD (gastroesophageal reflux disease)   . History of hiatal hernia   . Hyperlipidemia   . Hypertension   . Kidney cysts   . Liver cyst   . Liver cyst   . Lumbar stenosis   . LVH (left ventricular hypertrophy)    MILD LVH per echo in 2007  . Mitral valve regurgitation   . Obesity   . Palpitations   . PONV (postoperative nausea and vomiting)     Past Surgical History:  Procedure Laterality Date  . BREAST LUMPECTOMY Right 1995   RIGHT BREAST  . CHOLECYSTECTOMY    . ESOPHAGEAL MANOMETRY N/A 06/23/2017   Procedure: ESOPHAGEAL MANOMETRY (EM);  Surgeon: Juanita Craver, MD;  Location: [facility];  Service: Endoscopy;  Laterality: N/A;  . INSERTION OF MESH N/A 05/31/2018   Procedure: INSERTION OF MESH;  Surgeon: Ralene Ok, MD;  Location: [facility];  Service: General;  Laterality: N/A;  . LIPOMA EXCISION    . MENISCUS REPAIR Left   . US ECHOCARDIOGRAPHY  2007   SHOWED EF OF 55-60%     Medications: Current Meds  Medication Sig  . acetaminophen (TYLENOL) 500 MG tablet Take 500-1,000 mg by mouth every 8 (eight) hours as needed for mild pain or moderate pain.  Marland Kitchen apixaban (ELIQUIS) 5 MG TABS tablet Take 1 tablet (5 mg total) by mouth 2 (two) times daily.  . cetirizine (ZYRTEC) 10 MG tablet Take 10 mg by mouth daily.  . Collagen 500 MG CAPS  Take 2 capsules (1,000 mg total) by mouth daily.  Marland Kitchen ELDERBERRY PO Take by mouth.  . fluticasone (FLONASE) 50 MCG/ACT nasal spray Place 2 sprays into both nostrils daily as needed for allergies. After sinus rinses  . lisinopril-hydrochlorothiazide (ZESTORETIC) 20-25 MG tablet Take 1 tablet by mouth daily. OFFICE VISIT REQUIRED PRIOR TO ANY FURTHER REFILLS  . montelukast (SINGULAIR) 10 MG tablet Take 10 mg by mouth at bedtime. Per patient she "weaning off" only taking 1/2 tablet  . Multiple Vitamins-Minerals (MULTI ADULT GUMMIES) CHEW Chew by mouth.  . pravastatin (PRAVACHOL) 20 MG tablet Take 1 tablet (20 mg total) by mouth daily. OFFICE VISIT REQUIRED PRIOR TO ANY FURTHER REFILLS  . sertraline (ZOLOFT) 25 MG tablet Take 1 tablet (25 mg total) by mouth daily. **PATIENT NEEDS APT FOR FURTHER REFILLS**  . Vitamin D, Ergocalciferol, (DRISDOL) 1.25 MG (50000 UT) CAPS capsule TAKE 1 CAPSULE (50,000 UNITS TOTAL) BY MOUTH EVERY 7 (SEVEN) DAYS.     Allergies: Allergies  Allergen Reactions  . Darvon [Propoxyphene] Anaphylaxis  . Codeine     UNSPECIFIED REACTION   . Sulfa Drugs Cross Reactors     UNSPECIFIED REACTION   . Bystolic [Nebivolol Hcl] Other (See Comments)    No energy and lightheadedness.  . Diovan [Valsartan] Anxiety    Social History: The patient  reports that she has never smoked. She has never used smokeless tobacco. She reports that she does not drink alcohol or use drugs.   Family History: The patient's family history includes AAA (abdominal aortic aneurysm) in her father; Dementia in her mother; Heart disease in her mother; Heart disease (age of onset: 31) in her father; Hypertension (age of onset: 29) in her father.   Review of Systems: Please see the history of present illness.   All other systems are reviewed and negative.   Physical Exam: VS:  BP 120/78   Pulse 61   Ht 5\' 8"  (1.727 m)   Wt 191 lb 6.4 oz (86.8 kg)   SpO2 98%   BMI 29.10 kg/m  .  BMI Body mass index  is 29.1 kg/m.  Wt Readings from Last 3 Encounters:  12/13/19 191 lb 6.4 oz (86.8 kg)  05/29/19 185 lb 6.4 oz (84.1 kg)  05/01/19 181 lb 12.8 oz (82.5 kg)    General: Pleasant. Well developed, well nourished and in no acute distress.   HEENT: Normal.  Neck: Supple, no JVD, carotid bruits, or masses noted.  Cardiac: Regular rate and rhythm. No significant murmur. No edema.  Respiratory:  Lungs are clear to auscultation bilaterally with normal work of breathing.  GI: Soft and nontender.  MS: No deformity or atrophy. Gait and ROM intact.  Skin: Warm and dry. Color is normal.  Neuro:  Strength and sensation are intact and no gross focal deficits noted.  Psych: Alert, appropriate and with normal affect.   LABORATORY DATA:  EKG:  EKG is ordered today. This demonstrates Sinus Bradycardia - HR 56.  Lab Results  Component Value Date   WBC 5.7 05/01/2019   HGB 13.6 05/01/2019   HCT 40.8 05/01/2019   PLT 342 05/01/2019   GLUCOSE 91 05/01/2019   CHOL 124 05/01/2019   TRIG 59 05/01/2019   HDL 54 05/01/2019   LDLCALC 58 05/01/2019   ALT 23 05/01/2019   AST 35 05/01/2019   NA 137 05/01/2019   K 4.4 05/01/2019   CL 99 05/01/2019   CREATININE 0.71 05/01/2019   BUN 20 05/01/2019   CO2 24 05/01/2019   TSH 1.760 05/01/2019   HGBA1C 5.7 (H) 05/01/2019   MICROALBUR 19.0 04/28/2016        BNP (last 3 results) No results for input(s): BNP in the last 8760 hours.  ProBNP (last 3 results) No results for input(s): PROBNP in the last 8760 hours.   Other Studies Reviewed Today:  ECHO IMPRESSIONS 11/2019  1. Left ventricular ejection fraction, by estimation, is 60 to 65%. The  left ventricle has normal function. The left ventrical has no regional  wall motion abnormalities. There is mildly increased left ventricular  hypertrophy. Left ventricular diastolic  parameters were likely normal with medial tissue Doppler of 7 cm/s.  2. Right ventricular systolic function is normal. The  right ventricular  size is normal.  3. The mitral valve is normal in structure and function. Mild to moderate  mitral valve regurgitation by visual estimate, quantitation not performed.  No evidence of mitral stenosis.  4. Tricuspid valve regurgitation is mild to moderate.  5. The aortic valve is tricuspid. Aortic valve regurgitation is not  visualized. No aortic stenosis is present.   Comparison(s): Prior images unable to be directly viewed, comparison made  by report only. Compared to reports from 2007 and 2020, mitral  regurgitation is likely unchanged (mild in 2007, moderate in 2020 at OSH).   Notes Recorded by Wellington Hampshire, MD on 12/16/2014 at 12:54 PM Carotid Doppler showed minor atherosclerosis. The velocities are actually better than last year. Recommend repeat carotid Doppler in 2 years.    ASSESSMENT & PLAN:   1.PAF - with one lone episode - managed with CCB and anticoagulation. She could be considered for Tikosyn or Flecainide if has recurrence.   2. HTN - BP is fine - no changes made today.   3. HLD - on statin - labs from last month noted - would continue.   4. Carotid disease - does not need updating until 05/2021.   5. Valvular heart disease - most recent echo reviewed and shared with her - will follow.   6. COVID-19 Education: The signs and symptoms of COVID-19 were discussed with the patient and how to seek care for testing (follow up with PCP or arrange E-visit).  The importance of social distancing, staying at home, hand hygiene and wearing a mask when out in public were discussed today. She has had both her COVID vaccines.   Current medicines are reviewed with the patient today.  The patient does not have concerns regarding medicines other than what has been noted above.  The following changes have been made:  See above.  Labs/ tests ordered today include:    Orders Placed This Encounter  Procedures  . EKG 12-Lead     Disposition:  FU  with me in 6 months.   Patient is agreeable to this plan and will call if any problems develop in the interim.   SignedTruitt Merle, NP  12/13/2019 12:41 PM  [facility] [address] [place], [place]  [address] Phone: [phone] Fax: [phone]

## 2019-12-13 ENCOUNTER — Encounter: Payer: Self-pay | Admitting: Nurse Practitioner

## 2019-12-13 ENCOUNTER — Other Ambulatory Visit: Payer: Self-pay

## 2019-12-13 ENCOUNTER — Ambulatory Visit (INDEPENDENT_AMBULATORY_CARE_PROVIDER_SITE_OTHER): Payer: Medicare Other | Admitting: Nurse Practitioner

## 2019-12-13 VITALS — BP 120/78 | HR 61 | Ht 68.0 in | Wt 191.4 lb

## 2019-12-13 DIAGNOSIS — I340 Nonrheumatic mitral (valve) insufficiency: Secondary | ICD-10-CM

## 2019-12-13 DIAGNOSIS — Z7189 Other specified counseling: Secondary | ICD-10-CM

## 2019-12-13 DIAGNOSIS — I10 Essential (primary) hypertension: Secondary | ICD-10-CM

## 2019-12-13 DIAGNOSIS — Z7901 Long term (current) use of anticoagulants: Secondary | ICD-10-CM | POA: Diagnosis not present

## 2019-12-13 DIAGNOSIS — Z79899 Other long term (current) drug therapy: Secondary | ICD-10-CM

## 2019-12-13 DIAGNOSIS — I480 Paroxysmal atrial fibrillation: Secondary | ICD-10-CM | POA: Diagnosis not present

## 2019-12-13 NOTE — Patient Instructions (Addendum)

## 2019-12-14 DIAGNOSIS — L293 Anogenital pruritus, unspecified: Secondary | ICD-10-CM | POA: Diagnosis not present

## 2019-12-14 DIAGNOSIS — Z6827 Body mass index (BMI) 27.0-27.9, adult: Secondary | ICD-10-CM | POA: Diagnosis not present

## 2019-12-14 DIAGNOSIS — M545 Low back pain: Secondary | ICD-10-CM | POA: Diagnosis not present

## 2020-05-23 DIAGNOSIS — R42 Dizziness and giddiness: Secondary | ICD-10-CM | POA: Diagnosis not present

## 2020-05-27 NOTE — Progress Notes (Signed)
CARDIOLOGY OFFICE NOTE  Date:  06/05/2020    Kerrianne Rifle Date of Birth: July 20, 1946 Medical Record #[id number]  PCP:  Leonides Sake, MD  Cardiologist:  Servando Snare     Chief Complaint  Patient presents with  . Follow-up    Seen for Dr. Martinique    History of Present Illness: Corda Shutt is a 74 y.o. female who presents today for a 6 month check. Seen for Dr. Martinique.Has primarily followed with me.  She has HTN with LVH. Shehas beenintolerant to Bystolic and to Diovan.  Other issues include HLD, GERD, allergies and depression/anxiety. PCP follows her labs. She has minimal nonobstructive plaque on carotid duplex from February of 2015 and has seen Dr. Fletcher Anon for review. She had a repeat carotid Doppler in February of 2015 which showed mild atherosclerosis in the left side with less than 39% stenosis and increased velocity on the right side suggestive of 60-79% stenosis although no obvious stenosis was noted. Last carotids from 12/2014 actually improved and he recommended repeat study in 2018. Last Myoview was from 2010.  I have followed her over the years - she has had more issues with labile BP - typically with stress/anxiety issues. CV risk factor modification is a struggle at times.  She was lost to follow up for a few years - hadL4/L5 surgery with Dr. Sherwood Gambler in January of 2019, followed by hiatal hernia repair in July of 2019.I then saw her back to reestablish in March of 2020 - [place] and Eliquis - converted just prior to having TEE/cardioversion. Echo with moderate MR and mild to moderate TR on TTE. EF was over 70%. Was doing well on follow up - cost of Multaq was quite high - I spoke with Dr. Rayann Heman who also saw here with me atthatvisit - we elected to stop the Multaq and manage with justCCB and continued anticoagulation with Eliquis. No real indication for sleep study.  She has not had recurrent AF.  CHADSVASC is 38 (age, gender, HTN and PAD)  Last seen in February and was doing well - enjoying her new house and all her grand kids.   Comes in today. Here alone. She is doing well. Feels good. No chest pain. Breathing is fine. Some dizziness - does not check her BP when she feels this - it is fleeting - no syncope. BP is great. She had lab last month with PCP. Labs look good. She is happy with how she is doing. Now seeing Dr. Ellene Route since Dr. Sherwood Gambler retired.   Past Medical History:  Diagnosis Date  . Ankle bruise    SPONTANEOUS BRUISE ON RIGHT ANKLE  . Anxiety   . Complication of anesthesia   . GERD (gastroesophageal reflux disease)   . History of hiatal hernia   . Hyperlipidemia   . Hypertension   . Kidney cysts   . Liver cyst   . Liver cyst   . Lumbar stenosis   . LVH (left ventricular hypertrophy)    MILD LVH per echo in 2007  . Mitral valve regurgitation   . Obesity   . Palpitations   . PONV (postoperative nausea and vomiting)     Past Surgical History:  Procedure Laterality Date  . BREAST LUMPECTOMY Right 1995   RIGHT BREAST  . CHOLECYSTECTOMY    . ESOPHAGEAL MANOMETRY N/A 06/23/2017   Procedure: ESOPHAGEAL MANOMETRY (EM);  Surgeon: Juanita Craver, MD;  Location: [facility];  Service: Endoscopy;  Laterality: N/A;  . INSERTION OF MESH N/A 05/31/2018   Procedure: INSERTION OF MESH;  Surgeon: Ralene Ok, MD;  Location: [facility];  Service: General;  Laterality: N/A;  . LIPOMA EXCISION    . MENISCUS REPAIR Left   . US ECHOCARDIOGRAPHY  2007   SHOWED EF OF 55-60%     Medications: Current Meds  Medication Sig  . acetaminophen (TYLENOL) 500 MG tablet Take 500-1,000 mg by mouth every 8 (eight) hours as needed for mild pain or moderate pain.  Marland Kitchen apixaban (ELIQUIS) 5 MG TABS tablet Take 1 tablet (5 mg total) by mouth 2 (two) times daily.  . cetirizine (ZYRTEC) 10 MG tablet Take 10 mg by mouth daily.  . Collagen 500 MG CAPS Take 2  capsules (1,000 mg total) by mouth daily.  Marland Kitchen ELDERBERRY PO Take by mouth.  . fluticasone (FLONASE) 50 MCG/ACT nasal spray Place 2 sprays into both nostrils daily as needed for allergies. After sinus rinses  . lisinopril-hydrochlorothiazide (ZESTORETIC) 20-25 MG tablet Take 1 tablet by mouth daily. OFFICE VISIT REQUIRED PRIOR TO ANY FURTHER REFILLS  . Multiple Vitamins-Minerals (MULTI ADULT GUMMIES) CHEW Chew by mouth.  . pravastatin (PRAVACHOL) 20 MG tablet Take 1 tablet (20 mg total) by mouth daily. OFFICE VISIT REQUIRED PRIOR TO ANY FURTHER REFILLS  . sertraline (ZOLOFT) 25 MG tablet Take 1 tablet (25 mg total) by mouth daily. **PATIENT NEEDS APT FOR FURTHER REFILLS**     Allergies: Allergies  Allergen Reactions  . Darvon [Propoxyphene] Anaphylaxis  . Codeine     UNSPECIFIED REACTION   . Sulfa Drugs Cross Reactors     UNSPECIFIED REACTION   . Bystolic [Nebivolol Hcl] Other (See Comments)    No energy and lightheadedness.  . Diovan [Valsartan] Anxiety    Social History: The patient  reports that she has never smoked. She has never used smokeless tobacco. She reports that she does not drink alcohol and does not use drugs.   Family History: The patient's family history includes AAA (abdominal aortic aneurysm) in her father; Dementia in her mother; Heart disease in her mother; Heart disease (age of onset: 7) in her father; Hypertension (age of onset: 90) in her father.   Review of Systems: Please see the history of present illness.   All other systems are reviewed and negative.   Physical Exam: VS:  BP 114/74   Pulse 62   Ht 5\' 8"  (1.727 m)   Wt 189 lb (85.7 kg)   SpO2 100%   BMI 28.74 kg/m  .  BMI Body mass index is 28.74 kg/m.  Wt Readings from Last 3 Encounters:  06/05/20 189 lb (85.7 kg)  12/13/19 191 lb 6.4 oz (86.8 kg)  05/29/19 185 lb 6.4 oz (84.1 kg)    General: Pleasant. Alert and in no acute distress.   Cardiac: Regular rate and rhythm. No murmurs, rubs, or  gallops. No edema.  Respiratory:  Lungs are clear to auscultation bilaterally with normal work of breathing.  GI: Soft and nontender.  MS: No deformity or atrophy. Gait and ROM intact.  Skin: Warm and dry. Color is normal.  Neuro:  Strength and sensation are intact and no gross focal deficits noted.  Psych: Alert, appropriate and with normal affect.   LABORATORY DATA:  EKG:  EKG is not ordered today.     Lab Results  Component Value Date   WBC 5.7 05/01/2019   HGB 13.6 05/01/2019   HCT 40.8 05/01/2019  PLT 342 05/01/2019   GLUCOSE 91 05/01/2019   CHOL 124 05/01/2019   TRIG 59 05/01/2019   HDL 54 05/01/2019   LDLCALC 58 05/01/2019   ALT 23 05/01/2019   AST 35 05/01/2019   NA 137 05/01/2019   K 4.4 05/01/2019   CL 99 05/01/2019   CREATININE 0.71 05/01/2019   BUN 20 05/01/2019   CO2 24 05/01/2019   TSH 1.760 05/01/2019   HGBA1C 5.7 (H) 05/01/2019   MICROALBUR 19.0 04/28/2016       BNP (last 3 results) No results for input(s): BNP in the last 8760 hours.  ProBNP (last 3 results) No results for input(s): PROBNP in the last 8760 hours.   Other Studies Reviewed Today:  ECHO IMPRESSIONS 11/2019  1. Left ventricular ejection fraction, by estimation, is 60 to 65%. The  left ventricle has normal function. The left ventrical has no regional  wall motion abnormalities. There is mildly increased left ventricular  hypertrophy. Left ventricular diastolic  parameters were likely normal with medial tissue Doppler of 7 cm/s.  2. Right ventricular systolic function is normal. The right ventricular  size is normal.  3. The mitral valve is normal in structure and function. Mild to moderate  mitral valve regurgitation by visual estimate, quantitation not performed.  No evidence of mitral stenosis.  4. Tricuspid valve regurgitation is mild to moderate.  5. The aortic valve is tricuspid. Aortic valve regurgitation is not  visualized. No aortic stenosis is present.    Comparison(s): Prior images unable to be directly viewed, comparison made  by report only. Compared to reports from 2007 and 2020, mitral  regurgitation is likely unchanged (mild in 2007, moderate in 2020 at OSH).   Notes Recorded by Wellington Hampshire, MD on 12/16/2014 at 12:54 PM Carotid Doppler showed minor atherosclerosis. The velocities are actually better than last year. Recommend repeat carotid Doppler in 2 years.    ASSESSMENT & PLAN:   1. PAF - only one lone episode - have opted for anticoagulation. Remains on low dose CCB as well. Could be considered for Tikosyn or Flecainide if recurs.   2. Chronic anticoagulatin - labs look good - no problems noted. Would continue with Eliquis 5 mg BID.   3. HTN - BP is good - I have left her on her current regimen of ACE/diuretic and CCB.   4. Transient dizziness - I have asked her to check her BP when this happens if possible - may need medicines decreased.   5. Carotid disease - due to upcoming study - will arrange.   6. Valvular heart disease - echo from earlier this year noted and stable. She is not symptomatic.   Current medicines are reviewed with the patient today.  The patient does not have concerns regarding medicines other than what has been noted above.  The following changes have been made:  See above.  Labs/ tests ordered today include:    Orders Placed This Encounter  Procedures  . VAS US CAROTID     Disposition:   FU with [place] in 6 months. Labs have been done by her PCP. Will get her carotid study scheduled. She is to monitor her BP for me.    Patient is agreeable to this plan and will call if any problems develop in the interim.   SignedTruitt Merle, NP  06/05/2020 12:16 PM  [place] [address] [place], [place]  [address] Phone: [phone] Fax: [phone]

## 2020-05-28 DIAGNOSIS — R42 Dizziness and giddiness: Secondary | ICD-10-CM | POA: Diagnosis not present

## 2020-05-28 DIAGNOSIS — E7800 Pure hypercholesterolemia, unspecified: Secondary | ICD-10-CM | POA: Diagnosis not present

## 2020-05-28 DIAGNOSIS — Z6827 Body mass index (BMI) 27.0-27.9, adult: Secondary | ICD-10-CM | POA: Diagnosis not present

## 2020-05-28 DIAGNOSIS — R7303 Prediabetes: Secondary | ICD-10-CM | POA: Diagnosis not present

## 2020-05-28 DIAGNOSIS — I10 Essential (primary) hypertension: Secondary | ICD-10-CM | POA: Diagnosis not present

## 2020-05-28 DIAGNOSIS — Z9181 History of falling: Secondary | ICD-10-CM | POA: Diagnosis not present

## 2020-05-28 DIAGNOSIS — I4891 Unspecified atrial fibrillation: Secondary | ICD-10-CM | POA: Diagnosis not present

## 2020-05-28 DIAGNOSIS — Z1331 Encounter for screening for depression: Secondary | ICD-10-CM | POA: Diagnosis not present

## 2020-05-29 DIAGNOSIS — Z981 Arthrodesis status: Secondary | ICD-10-CM | POA: Diagnosis not present

## 2020-06-05 ENCOUNTER — Ambulatory Visit (INDEPENDENT_AMBULATORY_CARE_PROVIDER_SITE_OTHER): Payer: Medicare Other | Admitting: Nurse Practitioner

## 2020-06-05 ENCOUNTER — Other Ambulatory Visit: Payer: Self-pay

## 2020-06-05 ENCOUNTER — Encounter: Payer: Self-pay | Admitting: Nurse Practitioner

## 2020-06-05 VITALS — BP 114/74 | HR 62 | Ht 68.0 in | Wt 189.0 lb

## 2020-06-05 DIAGNOSIS — Z7901 Long term (current) use of anticoagulants: Secondary | ICD-10-CM | POA: Diagnosis not present

## 2020-06-05 DIAGNOSIS — Z79899 Other long term (current) drug therapy: Secondary | ICD-10-CM | POA: Diagnosis not present

## 2020-06-05 DIAGNOSIS — I6529 Occlusion and stenosis of unspecified carotid artery: Secondary | ICD-10-CM

## 2020-06-05 DIAGNOSIS — I10 Essential (primary) hypertension: Secondary | ICD-10-CM | POA: Diagnosis not present

## 2020-06-05 DIAGNOSIS — I480 Paroxysmal atrial fibrillation: Secondary | ICD-10-CM

## 2020-06-05 NOTE — Patient Instructions (Addendum)
After Visit Summary:  We will be checking the following labs today - NONE   Medication Instructions:    Continue with your current medicines.    If you need a refill on your cardiac medications before your next appointment, please call your pharmacy.     Testing/Procedures To Be Arranged:  Carotid doppler  Follow-Up:   See me in February    At [facility], you and your health needs are our priority.  As part of our continuing mission to provide you with exceptional heart care, we have created designated Provider Care Teams.  These Care Teams include your primary Cardiologist (physician) and Advanced Practice Providers (APPs -  Physician Assistants and Nurse Practitioners) who all work together to provide you with the care you need, when you need it.  Special Instructions:  . Stay safe, wash your hands for at least 20 seconds and wear a mask when needed.  . It was good to talk with you today.    Call the [place] office at [phone] if you have any questions, problems or concerns.

## 2020-06-18 DIAGNOSIS — M170 Bilateral primary osteoarthritis of knee: Secondary | ICD-10-CM | POA: Diagnosis not present

## 2020-06-26 ENCOUNTER — Ambulatory Visit (HOSPITAL_COMMUNITY): Admission: RE | Admit: 2020-06-26 | Payer: Medicare Other | Source: Ambulatory Visit

## 2020-07-02 DIAGNOSIS — M170 Bilateral primary osteoarthritis of knee: Secondary | ICD-10-CM | POA: Diagnosis not present

## 2020-07-09 DIAGNOSIS — M170 Bilateral primary osteoarthritis of knee: Secondary | ICD-10-CM | POA: Diagnosis not present

## 2020-07-10 ENCOUNTER — Other Ambulatory Visit: Payer: Self-pay

## 2020-07-10 ENCOUNTER — Ambulatory Visit (HOSPITAL_COMMUNITY)
Admission: RE | Admit: 2020-07-10 | Discharge: 2020-07-10 | Disposition: A | Discharge status: Home / Self Care | Payer: Medicare Other | Source: Ambulatory Visit | Attending: Cardiovascular Disease | Admitting: Cardiovascular Disease

## 2020-07-10 DIAGNOSIS — I6523 Occlusion and stenosis of bilateral carotid arteries: Secondary | ICD-10-CM

## 2020-07-10 DIAGNOSIS — I6529 Occlusion and stenosis of unspecified carotid artery: Secondary | ICD-10-CM | POA: Diagnosis not present

## 2020-07-16 DIAGNOSIS — M170 Bilateral primary osteoarthritis of knee: Secondary | ICD-10-CM | POA: Diagnosis not present

## 2020-07-30 DIAGNOSIS — M7062 Trochanteric bursitis, left hip: Secondary | ICD-10-CM | POA: Diagnosis not present

## 2020-07-30 DIAGNOSIS — M170 Bilateral primary osteoarthritis of knee: Secondary | ICD-10-CM | POA: Diagnosis not present

## 2020-08-08 DIAGNOSIS — M1712 Unilateral primary osteoarthritis, left knee: Secondary | ICD-10-CM | POA: Diagnosis not present

## 2020-09-26 DIAGNOSIS — R42 Dizziness and giddiness: Secondary | ICD-10-CM | POA: Diagnosis not present

## 2020-09-26 DIAGNOSIS — Z6827 Body mass index (BMI) 27.0-27.9, adult: Secondary | ICD-10-CM | POA: Diagnosis not present

## 2020-09-26 DIAGNOSIS — J309 Allergic rhinitis, unspecified: Secondary | ICD-10-CM | POA: Diagnosis not present

## 2020-09-26 DIAGNOSIS — I4891 Unspecified atrial fibrillation: Secondary | ICD-10-CM | POA: Diagnosis not present

## 2020-10-01 DIAGNOSIS — M7062 Trochanteric bursitis, left hip: Secondary | ICD-10-CM | POA: Diagnosis not present

## 2020-10-01 DIAGNOSIS — M47816 Spondylosis without myelopathy or radiculopathy, lumbar region: Secondary | ICD-10-CM | POA: Diagnosis not present

## 2020-10-01 DIAGNOSIS — M25562 Pain in left knee: Secondary | ICD-10-CM | POA: Diagnosis not present

## 2020-10-07 ENCOUNTER — Other Ambulatory Visit: Payer: Self-pay | Admitting: Family Medicine

## 2020-10-07 DIAGNOSIS — Z1231 Encounter for screening mammogram for malignant neoplasm of breast: Secondary | ICD-10-CM

## 2020-11-13 DIAGNOSIS — I4891 Unspecified atrial fibrillation: Secondary | ICD-10-CM | POA: Diagnosis not present

## 2020-11-13 DIAGNOSIS — Z20822 Contact with and (suspected) exposure to covid-19: Secondary | ICD-10-CM | POA: Diagnosis not present

## 2020-11-13 DIAGNOSIS — R059 Cough, unspecified: Secondary | ICD-10-CM | POA: Diagnosis not present

## 2020-11-13 NOTE — Progress Notes (Signed)
CARDIOLOGY OFFICE NOTE  Date:  11/27/2020    Carla Lewis Date of Birth: 1946-09-10 Medical Record #[id number]  PCP:  Leonides Sake, MD  Cardiologist:  Salle Brandle & Martin[place]   Chief Complaint  Patient presents with  . Follow-up    History of Present Illness: Carla Lewis is a 75 y.o. female who presents today for a follow up visit. Seen for Dr. Martinique. Has primarily followed with me.    She has HTN with LVH. She has been intolerant to Bystolic and to Diovan.  Other issues include HLD, GERD, allergies and depression/anxiety. PCP follows her labs. She has minimal nonobstructive plaque on carotid duplex from February of 2015 and has seen Dr. Fletcher Anon for review. She had a repeat carotid Doppler in February of 2015 which showed mild atherosclerosis in the left side with less than 39% stenosis and increased velocity on the right side suggestive of 60-79% stenosis although no obvious stenosis was noted. Last carotids from 12/2014 actually improved and he recommended repeat study in 2018. Last Myoview was from 2010.    I have followed her over the years - she has had more issues with labile BP - typically with stress/anxiety issues. CV risk factor modification is a struggle at times.    She was lost to follow up for a few years - had L4/L5 surgery with Dr. Sherwood Gambler in January of 2019, followed by hiatal hernia repair in July of 2019. I then saw her back to reestablish in March of 2020 - she had had PAF documented - occurred while in [place] - placed on Multaq and Eliquis - converted just prior to having TEE/cardioversion. Echo with moderate MR and mild to moderate TR on TTE. EF was over 70%. Was doing well on follow up - cost of Multaq was quite high - I spoke with Dr. Rayann Heman who also saw here with me at that visit - we elected to stop the Multaq and manage with just CCB and continued anticoagulation with Eliquis. No real indication for sleep study. She has not had  recurrent AF.    CHADSVASC is 37 (age, gender, HTN and PAD)   Last seen in August and was doing ok.   Comes in today. Here alone. She is doing well. Just had labs yesterday - I was able to review - look great. No chest pain. Not short of breath. She typically "is a ball of energy". She has noted more "weak spells" about 3 to 4 hours after medicines. BP has been fine. No AF that she is aware of. She is wondering if she needs less medicine. Did have COVID last month - the weakness was a little worse with that.   Past Medical History:  Diagnosis Date  . Ankle bruise    SPONTANEOUS BRUISE ON RIGHT ANKLE  . Anxiety   . Complication of anesthesia   . GERD (gastroesophageal reflux disease)   . History of hiatal hernia   . Hyperlipidemia   . Hypertension   . Kidney cysts   . Liver cyst   . Liver cyst   . Lumbar stenosis   . LVH (left ventricular hypertrophy)    MILD LVH per echo in 2007  . Mitral valve regurgitation   . Obesity   . Palpitations   . PONV (postoperative nausea and vomiting)     Past Surgical History:  Procedure Laterality Date  . BREAST LUMPECTOMY Right 1995   RIGHT BREAST  . CHOLECYSTECTOMY    .  ESOPHAGEAL MANOMETRY N/A 06/23/2017   Procedure: ESOPHAGEAL MANOMETRY (EM);  Surgeon: Juanita Craver, MD;  Location: [facility];  Service: Endoscopy;  Laterality: N/A;  . INSERTION OF MESH N/A 05/31/2018   Procedure: INSERTION OF MESH;  Surgeon: Ralene Ok, MD;  Location: [facility];  Service: General;  Laterality: N/A;  . LIPOMA EXCISION    . MENISCUS REPAIR Left   . US ECHOCARDIOGRAPHY  2007   SHOWED EF OF 55-60%     Medications: Current Meds  Medication Sig  . acetaminophen (TYLENOL) 500 MG tablet Take 500-1,000 mg by mouth every 8 (eight) hours as needed for mild pain or moderate pain.  Marland Kitchen apixaban (ELIQUIS) 5 MG TABS tablet Take 1 tablet (5 mg total) by mouth 2 (two) times daily.  . cetirizine (ZYRTEC) 10 MG tablet Take 10 mg by mouth daily.  . Collagen 500 MG  CAPS Take 2 capsules (1,000 mg total) by mouth daily.  Marland Kitchen diltiazem (CARDIZEM CD) 120 MG 24 hr capsule Take 1 capsule (120 mg total) by mouth daily.  Marland Kitchen ELDERBERRY PO Take by mouth.  . fluticasone (FLONASE) 50 MCG/ACT nasal spray Place 2 sprays into both nostrils daily as needed for allergies. After sinus rinses  . lisinopril-hydrochlorothiazide (ZESTORETIC) 20-25 MG tablet Take 1 tablet by mouth daily. OFFICE VISIT REQUIRED PRIOR TO ANY FURTHER REFILLS  . montelukast (SINGULAIR) 10 MG tablet Take 10 mg by mouth daily.  . Multiple Vitamins-Minerals (MULTI ADULT GUMMIES) CHEW Chew by mouth.  . pravastatin (PRAVACHOL) 20 MG tablet Take 1 tablet (20 mg total) by mouth daily. OFFICE VISIT REQUIRED PRIOR TO ANY FURTHER REFILLS  . sertraline (ZOLOFT) 25 MG tablet Take 1 tablet (25 mg total) by mouth daily. **PATIENT NEEDS APT FOR FURTHER REFILLS**  . [DISCONTINUED] diltiazem (DILACOR XR) 180 MG 24 hr capsule Take 180 mg by mouth daily.     Allergies: Allergies  Allergen Reactions  . Darvon [Propoxyphene] Anaphylaxis  . Codeine     UNSPECIFIED REACTION   . Sulfa Drugs Cross Reactors     UNSPECIFIED REACTION   . Bystolic [Nebivolol Hcl] Other (See Comments)    No energy and lightheadedness.  . Diovan [Valsartan] Anxiety    Social History: The patient  reports that she has never smoked. She has never used smokeless tobacco. She reports that she does not drink alcohol and does not use drugs.   Family History: The patient's family history includes AAA (abdominal aortic aneurysm) in her father; Dementia in her mother; Heart disease in her mother; Heart disease (age of onset: 78) in her father; Hypertension (age of onset: 34) in her father.   Review of Systems: Please see the history of present illness.   All other systems are reviewed and negative.   Physical Exam: VS:  BP 132/80   Pulse 62   Ht 5\' 8"  (1.727 m)   Wt 187 lb (84.8 kg)   SpO2 98%   BMI 28.43 kg/m  .  BMI Body mass index is  28.43 kg/m.  Wt Readings from Last 3 Encounters:  11/27/20 187 lb (84.8 kg)  06/05/20 189 lb (85.7 kg)  12/13/19 191 lb 6.4 oz (86.8 kg)    General: Pleasant. Alert and in no acute distress.   Cardiac: Regular rate and rhythm. No murmurs, rubs, or gallops. No edema.  Respiratory:  Lungs are clear to auscultation bilaterally with normal work of breathing.  GI: Soft and nontender.  MS: No deformity or atrophy. Gait and ROM intact.  Skin: Warm and  dry. Color is normal.  Neuro:  Strength and sensation are intact and no gross focal deficits noted.  Psych: Alert, appropriate and with normal affect.   LABORATORY DATA:  EKG:  EKG is ordered today.  Personally reviewed by me. This demonstrates NSR.  Lab Results  Component Value Date   WBC 5.7 05/01/2019   HGB 13.6 05/01/2019   HCT 40.8 05/01/2019   PLT 342 05/01/2019   GLUCOSE 91 05/01/2019   CHOL 124 05/01/2019   TRIG 59 05/01/2019   HDL 54 05/01/2019   LDLCALC 58 05/01/2019   ALT 23 05/01/2019   AST 35 05/01/2019   NA 137 05/01/2019   K 4.4 05/01/2019   CL 99 05/01/2019   CREATININE 0.71 05/01/2019   BUN 20 05/01/2019   CO2 24 05/01/2019   TSH 1.760 05/01/2019   HGBA1C 5.7 (H) 05/01/2019   MICROALBUR 19.0 04/28/2016       BNP (last 3 results) No results for input(s): BNP in the last 8760 hours.  ProBNP (last 3 results) No results for input(s): PROBNP in the last 8760 hours.   Other Studies Reviewed Today:  ECHO IMPRESSIONS 11/2019   1. Left ventricular ejection fraction, by estimation, is 60 to 65%. The  left ventricle has normal function. The left ventrical has no regional  wall motion abnormalities. There is mildly increased left ventricular  hypertrophy. Left ventricular diastolic  parameters were likely normal with medial tissue Doppler of 7 cm/s.   2. Right ventricular systolic function is normal. The right ventricular  size is normal.   3. The mitral valve is normal in structure and function. Mild to  moderate  mitral valve regurgitation by visual estimate, quantitation not performed.  No evidence of mitral stenosis.   4. Tricuspid valve regurgitation is mild to moderate.   5. The aortic valve is tricuspid. Aortic valve regurgitation is not  visualized. No aortic stenosis is present.   Comparison(s): Prior images unable to be directly viewed, comparison made  by report only. Compared to reports from 2007 and 2020, mitral  regurgitation is likely unchanged (mild in 2007, moderate in 2020 at OSH).    Notes Recorded by Wellington Hampshire, MD on 12/16/2014 at 12:54 PM Carotid Doppler showed minor atherosclerosis. The velocities are actually better than last year. Recommend repeat carotid Doppler in 2 years.       ASSESSMENT & PLAN:     1. Weak spells - no real clear cut etiology - will let her cut her dose of CCB back to 120 mg a day. Recent labs were fine.   2. PAF - only had one lone episode - after discussion with Dr. Rayann Heman - we opted for anticoagulation - if recurs could consider Flecainide.   3. Chronic anticoagulatin - labs look good from yesterday -  no problems noted. Would continue with Eliquis 5 mg BID.  4. Carotid disease - last study in 2021 - favor repeating in 1 to 2 years.     5. Valvular heart disease - echo from last year noted and stable. She is not symptomatic.   Current medicines are reviewed with the patient today.  The patient does not have concerns regarding medicines other than what has been noted above.  The following changes have been made:  See above.  Labs/ tests ordered today include:    Orders Placed This Encounter  Procedures  . EKG 12-Lead     Disposition:   FU with Dr. Johney Frame in about 4 months -  she may end up changing over to her husband's cardiologist Dr. Einar Gip - either way will be fine. She is aware that I am leaving in 2 weeks.    Patient is agreeable to this plan and will call if any problems develop in the interim.   SignedTruitt Merle, NP  11/27/2020 11:45 AM  [place] [address] [place],   [address] Phone: [phone] Fax: [phone]

## 2020-11-18 ENCOUNTER — Ambulatory Visit: Payer: Medicare Other

## 2020-11-26 DIAGNOSIS — Z79899 Other long term (current) drug therapy: Secondary | ICD-10-CM | POA: Diagnosis not present

## 2020-11-26 DIAGNOSIS — E7800 Pure hypercholesterolemia, unspecified: Secondary | ICD-10-CM | POA: Diagnosis not present

## 2020-11-27 ENCOUNTER — Encounter: Payer: Self-pay | Admitting: Nurse Practitioner

## 2020-11-27 ENCOUNTER — Ambulatory Visit: Payer: Medicare HMO | Admitting: Nurse Practitioner

## 2020-11-27 ENCOUNTER — Other Ambulatory Visit: Payer: Self-pay

## 2020-11-27 VITALS — BP 132/80 | HR 62 | Ht 68.0 in | Wt 187.0 lb

## 2020-11-27 DIAGNOSIS — I10 Essential (primary) hypertension: Secondary | ICD-10-CM

## 2020-11-27 DIAGNOSIS — I480 Paroxysmal atrial fibrillation: Secondary | ICD-10-CM | POA: Diagnosis not present

## 2020-11-27 DIAGNOSIS — Z7901 Long term (current) use of anticoagulants: Secondary | ICD-10-CM | POA: Diagnosis not present

## 2020-11-27 MED ORDER — DILTIAZEM HCL ER COATED BEADS 120 MG PO CP24: 120.0000 mg | ORAL_CAPSULE | Freq: Every day | ORAL | 3 refills | Status: DC

## 2020-11-27 NOTE — Patient Instructions (Addendum)
After Visit Summary:  We will be checking the following labs today - NONE   Medication Instructions:    Continue with your current medicines. BUT  I am cutting the Diltiazem back to 120 mg a day - I sent this to your pharmacy.    If you need a refill on your cardiac medications before your next appointment, please call your pharmacy.     Testing/Procedures To Be Arranged:  N/A  Follow-Up:   See Dr. Gwyndolyn Kaufman in about 4 months    At [facility], you and your health needs are our priority.  As part of our continuing mission to provide you with exceptional heart care, we have created designated Provider Care Teams.  These Care Teams include your primary Cardiologist (physician) and Advanced Practice Providers (APPs -  Physician Assistants and Nurse Practitioners) who all work together to provide you with the care you need, when you need it.  Special Instructions:  . Stay safe, wash your hands for at least 20 seconds and wear a mask when needed.  . It was good to talk with you today.    Call the [place] office at [phone] if you have any questions, problems or concerns.

## 2020-11-28 DIAGNOSIS — R7303 Prediabetes: Secondary | ICD-10-CM | POA: Diagnosis not present

## 2020-11-28 DIAGNOSIS — I4891 Unspecified atrial fibrillation: Secondary | ICD-10-CM | POA: Diagnosis not present

## 2020-11-28 DIAGNOSIS — Z6827 Body mass index (BMI) 27.0-27.9, adult: Secondary | ICD-10-CM | POA: Diagnosis not present

## 2020-11-28 DIAGNOSIS — Z139 Encounter for screening, unspecified: Secondary | ICD-10-CM | POA: Diagnosis not present

## 2020-11-28 DIAGNOSIS — E7800 Pure hypercholesterolemia, unspecified: Secondary | ICD-10-CM | POA: Diagnosis not present

## 2020-11-28 DIAGNOSIS — I10 Essential (primary) hypertension: Secondary | ICD-10-CM | POA: Diagnosis not present

## 2020-12-12 DIAGNOSIS — Z0100 Encounter for examination of eyes and vision without abnormal findings: Secondary | ICD-10-CM | POA: Diagnosis not present

## 2020-12-12 DIAGNOSIS — H5213 Myopia, bilateral: Secondary | ICD-10-CM | POA: Diagnosis not present

## 2020-12-30 ENCOUNTER — Other Ambulatory Visit: Payer: Self-pay

## 2020-12-30 ENCOUNTER — Ambulatory Visit
Admission: RE | Admit: 2020-12-30 | Discharge: 2020-12-30 | Disposition: A | Discharge status: Home / Self Care | Payer: Medicare HMO | Source: Ambulatory Visit | Attending: Family Medicine | Admitting: Family Medicine

## 2020-12-30 DIAGNOSIS — Z1231 Encounter for screening mammogram for malignant neoplasm of breast: Secondary | ICD-10-CM

## 2021-03-27 ENCOUNTER — Ambulatory Visit: Payer: Medicare HMO | Admitting: Cardiology

## 2021-04-23 ENCOUNTER — Encounter: Payer: Self-pay | Admitting: Cardiology

## 2021-04-23 ENCOUNTER — Ambulatory Visit: Payer: Medicare HMO | Admitting: Cardiology

## 2021-04-23 ENCOUNTER — Other Ambulatory Visit: Payer: Self-pay

## 2021-04-23 VITALS — BP 122/75 | HR 70 | Temp 98.0°F | Resp 17 | Ht 68.0 in | Wt 183.3 lb

## 2021-04-23 DIAGNOSIS — I10 Essential (primary) hypertension: Secondary | ICD-10-CM | POA: Diagnosis not present

## 2021-04-23 DIAGNOSIS — I480 Paroxysmal atrial fibrillation: Secondary | ICD-10-CM | POA: Diagnosis not present

## 2021-04-23 DIAGNOSIS — Z79899 Other long term (current) drug therapy: Secondary | ICD-10-CM

## 2021-04-23 NOTE — Progress Notes (Signed)
Primary Physician/Referring:  Leonides Sake, MD  Patient ID: Carla Lewis, female    DOB: 06/05/1946, 75 y.o.   MRN: [id number]  Chief Complaint  Patient presents with   New Patient (Initial Visit)   PAF (paroxysmal atrial fibrillation) ([place])   HPI:    Carla Lewis  is a 75 y.o. Caucasian female patient who was previously seen Dr. Martinique for paroxysmal atrial fibrillation (first episode in March 2020 while visiting [place], [place]), no further recurrence except 3 months ago patient had 10 minutes of atrial fibrillation noted on apple watch, hypertension, hyperlipidemia, GERD.  She has severe tortuosity involving the carotid arteries and had falsely elevated velocity.  But otherwise minimal plaque was evident.  She now presents to establish care with me as her primary cardiologist, Carla Merle, NP retired.  She is presently asymptomatic.  She is accompanied by her husband.  Overall she has had 1 episode of atrial fibrillation in 2020 and again states that she had an episode 3 months ago that lasted 10 minutes and was documented on her apple watch per patient.  Past Medical History:  Diagnosis Date   Ankle bruise    SPONTANEOUS BRUISE ON RIGHT ANKLE   Anxiety    Complication of anesthesia    GERD (gastroesophageal reflux disease)    History of hiatal hernia    Hyperlipidemia    Hypertension    Kidney cysts    Liver cyst    Liver cyst    Lumbar stenosis    LVH (left ventricular hypertrophy)    MILD LVH per echo in 2007   Mitral valve regurgitation    Obesity    Palpitations    PONV (postoperative nausea and vomiting)    Past Surgical History:  Procedure Laterality Date   BREAST LUMPECTOMY Right 1995   RIGHT BREAST   CHOLECYSTECTOMY     ESOPHAGEAL MANOMETRY N/A 06/23/2017   Procedure: ESOPHAGEAL MANOMETRY (EM);  Surgeon: Juanita Craver, MD;  Location: [facility];  Service: Endoscopy;  Laterality: N/A;   INSERTION OF MESH N/A 05/31/2018    Procedure: INSERTION OF MESH;  Surgeon: Ralene Ok, MD;  Location: [facility];  Service: General;  Laterality: N/A;   LIPOMA EXCISION     MENISCUS REPAIR Left    US ECHOCARDIOGRAPHY  2007   SHOWED EF OF 55-60%   Family History  Problem Relation Age of Onset   Dementia Mother 36       Passed away at 92   Heart disease Mother        cardiomyopathy   Heart disease Father 37   Hypertension Father 58   AAA (abdominal aortic aneurysm) Father     Social History   Tobacco Use   Smoking status: Never   Smokeless tobacco: Never  Substance Use Topics   Alcohol use: No   Marital Status: Married  ROS  Review of Systems  Cardiovascular:  Positive for palpitations. Negative for chest pain, dyspnea on exertion and leg swelling.  Gastrointestinal:  Negative for melena.  Objective  Blood pressure 122/75, pulse 70, temperature 98 F (36.7 C), temperature source Temporal, resp. rate 17, height 5\' 8"  (1.727 m), weight 183 lb 4.8 oz (83.1 kg), SpO2 98 %. Body mass index is 27.87 kg/m.  Vitals with BMI 04/23/2021 11/27/2020 06/05/2020  Height 5\' 8"  5\' 8"  5\' 8"   Weight 183 lbs 5 oz 187 lbs 189 lbs  BMI 27.88 47.42 59.56  Systolic 387 564 332  Diastolic 75 80 74  Pulse 70 62 62     Physical Exam Constitutional:      Appearance: Normal appearance.  Neck:     Vascular: No carotid bruit or JVD.  Cardiovascular:     Rate and Rhythm: Normal rate and regular rhythm.     Pulses: Intact distal pulses.     Heart sounds: Normal heart sounds. No murmur heard.   No gallop.  Pulmonary:     Effort: Pulmonary effort is normal.     Breath sounds: Normal breath sounds.  Abdominal:     General: Bowel sounds are normal.     Palpations: Abdomen is soft.  Musculoskeletal:        General: No swelling.  Skin:    Capillary Refill: Capillary refill takes less than 2 seconds.  Neurological:     General: No focal deficit present.     Laboratory examination:   No results for input(s): NA, K, CL, CO2,  GLUCOSE, BUN, CREATININE, CALCIUM, GFRNONAA, GFRAA in the last 8760 hours. CrCl cannot be calculated (Patient's most recent lab result is older than the maximum 21 days allowed.).  CMP Latest Ref Rng & Units 05/01/2019 07/29/2018 06/01/2018  Glucose 65 - 99 mg/dL 91 89 161(H)  BUN 8 - 27 mg/dL 20 19 13   Creatinine 0.57 - 1.00 mg/dL 0.71 0.66 0.60  Sodium 134 - 144 mmol/L 137 135 128(L)  Potassium 3.5 - 5.2 mmol/L 4.4 4.4 3.8  Chloride 96 - 106 mmol/L 99 97 97(L)  CO2 20 - 29 mmol/L 24 25 23   Calcium 8.7 - 10.3 mg/dL 9.4 9.3 8.5(L)  Total Protein 6.0 - 8.5 g/dL 6.4 - -  Total Bilirubin 0.0 - 1.2 mg/dL 0.4 - -  Alkaline Phos 39 - 117 IU/L 92 - -  AST 0 - 40 IU/L 35 - -  ALT 0 - 32 IU/L 23 - -   CBC Latest Ref Rng & Units 05/01/2019 05/24/2018 04/12/2018  WBC 3.4 - 10.8 x10E3/uL 5.7 5.3 5.5  Hemoglobin 11.1 - 15.9 g/dL 13.6 13.1 13.6  Hematocrit 34.0 - 46.6 % 40.8 39.7 40.9  Platelets 150 - 450 x10E3/uL 342 408(H) 404   Lipid Panel No results for input(s): CHOL, TRIG, LDLCALC, VLDL, HDL, CHOLHDL, LDLDIRECT in the last 8760 hours. Lipid Panel     Component Value Date/Time   CHOL 124 05/01/2019 1104   TRIG 59 05/01/2019 1104   HDL 54 05/01/2019 1104   CHOLHDL 2.3 05/01/2019 1104   CHOLHDL 2.6 02/05/2016 1201   VLDL 14 02/05/2016 1201   LDLCALC 58 05/01/2019 1104   LABVLDL 12 05/01/2019 1104    HEMOGLOBIN A1C Lab Results  Component Value Date   HGBA1C 5.7 (H) 05/01/2019   Cholesterol, total 132.000 m 11/26/2020 HDL 54.000 mg 11/26/2020 LDL 65.000 mg 11/26/2020 Triglycerides 62.000 mg 11/26/2020  A1C 5.600 % 09/26/2020 TSH 1.760 05/01/2019  Hemoglobin 14.600 g/d 11/26/2020 Platelets 430.000 x1 11/26/2020  Creatinine, Serum 0.730 mg/ 11/26/2020 Potassium 4.000 mm 11/26/2020 ALT (SGPT) 11.000 IU/ 11/26/2020  Medications and allergies   Allergies  Allergen Reactions   Darvon [Propoxyphene] Anaphylaxis   Codeine     UNSPECIFIED REACTION    Sulfa Drugs Cross Reactors     UNSPECIFIED  REACTION    Bystolic [Nebivolol Hcl] Other (See Comments)    No energy and lightheadedness.   Diovan [Valsartan] Anxiety    Medication prior to this encounter:   Outpatient Medications Prior to Visit  Medication Sig Dispense Refill   acetaminophen (TYLENOL) 500 MG tablet Take 500-1,000  mg by mouth every 8 (eight) hours as needed for mild pain or moderate pain.     apixaban (ELIQUIS) 5 MG TABS tablet Take 1 tablet (5 mg total) by mouth 2 (two) times daily. 180 tablet 4   cetirizine (ZYRTEC) 10 MG tablet Take 10 mg by mouth daily.     Collagen 500 MG CAPS Take 2 capsules (1,000 mg total) by mouth daily. 60 capsule 0   diltiazem (CARDIZEM CD) 120 MG 24 hr capsule Take 1 capsule (120 mg total) by mouth daily. 90 capsule 3   ELDERBERRY PO Take by mouth.     fluticasone (FLONASE) 50 MCG/ACT nasal spray Place 2 sprays into both nostrils daily as needed for allergies. After sinus rinses     lisinopril-hydrochlorothiazide (ZESTORETIC) 20-25 MG tablet Take 1 tablet by mouth daily. OFFICE VISIT REQUIRED PRIOR TO ANY FURTHER REFILLS 30 tablet 0   montelukast (SINGULAIR) 10 MG tablet Take 10 mg by mouth daily.     pravastatin (PRAVACHOL) 20 MG tablet Take 1 tablet (20 mg total) by mouth daily. OFFICE VISIT REQUIRED PRIOR TO ANY FURTHER REFILLS 30 tablet 0   sertraline (ZOLOFT) 25 MG tablet Take 1 tablet (25 mg total) by mouth daily. **PATIENT NEEDS APT FOR FURTHER REFILLS** 30 tablet 0   Multiple Vitamins-Minerals (MULTI ADULT GUMMIES) CHEW Chew by mouth.     No facility-administered medications prior to visit.     FINAL MEDICATION AS OF TODAY:   Medications after current encounter Current Outpatient Medications  Medication Instructions   acetaminophen (TYLENOL) 500-1,000 mg, Oral, Every 8 hours PRN   apixaban (ELIQUIS) 5 mg, Oral, 2 times daily   cetirizine (ZYRTEC) 10 mg, Oral, Daily   Collagen 1,000 mg, Oral, Daily   diltiazem (CARDIZEM CD) 120 mg, Oral, Daily   ELDERBERRY PO Oral    fluticasone (FLONASE) 50 MCG/ACT nasal spray 2 sprays, Each Nare, Daily PRN, After sinus rinses   lisinopril-hydrochlorothiazide (ZESTORETIC) 20-25 MG tablet 1 tablet, Oral, Daily, OFFICE VISIT REQUIRED PRIOR TO ANY FURTHER REFILLS   montelukast (SINGULAIR) 10 mg, Oral, Daily   pravastatin (PRAVACHOL) 20 mg, Oral, Daily, OFFICE VISIT REQUIRED PRIOR TO ANY FURTHER REFILLS   sertraline (ZOLOFT) 25 mg, Oral, Daily, **PATIENT NEEDS APT FOR FURTHER REFILLS**   Radiology:   No results found.  Cardiac Studies:   Nuclear stress test 02/27/2009: Normal perfusion.    Carotid artery duplex 07/10/2020: Right Carotid: Velocities in the right ICA are consistent with a 1-39% stenosis. Left Carotid: Velocities in the left ICA are consistent with a 1-39% stenosis. Vertebrals:  Bilateral vertebral arteries demonstrate antegrade flow. Subclavians: Normal flow hemodynamics were seen in bilateral subclavian  arteries. No significant change from 06/07/2019.  Echocardiogram 11/29/2019:  1. Left ventricular ejection fraction, by estimation, is 60 to 65%. The left ventricle has normal function. The left ventrical has no regional wall motion abnormalities. There is mildly increased left ventricular hypertrophy. Left ventricular diastolic parameters were likely normal with medial tissue Doppler of 7 cm/s.  2. Right ventricular systolic function is normal. The right ventricular size is normal.  3. The mitral valve is normal in structure and function. Mild to moderate mitral valve regurgitation by visual estimate, quantitation not performed. No evidence of mitral stenosis.  4. Tricuspid valve regurgitation is mild to moderate.  5. The aortic valve is tricuspid. Aortic valve regurgitation is not visualized. No aortic stenosis is present.  EKG:   EKG 04/23/2021: Normal sinus rhythm at rate of 59 bpm, left atrial enlargement, normal  axis.  IVCD, borderline criteria for LVH.    Assessment     ICD-10-CM   1. Essential  hypertension  I10 EKG 12-Lead    2. PAF (paroxysmal atrial fibrillation) (H[facility])  I48.0     3. High risk medication use  Z79.899       CHA2DS2-VASc Score is 2.  Yearly risk of stroke: 2.3% (F, HTN, 3 if mild carotid atherosclerosis taken into consideration).  Score of 1=0.6; 2=2.2; 3=3.2; 4=4.8; 5=7.2; 6=9.8; 7=>9.8) -(CHF; HTN; vasc disease DM,  Female = 1; Age <65 =0; 65-74 = 1,  >75 =2; stroke/embolism= 2).   Medications Discontinued During This Encounter  Medication Reason   Multiple Vitamins-Minerals ([place]) CHEW Error    No orders of the defined types were placed in this encounter.  Orders Placed This Encounter  Procedures   EKG 12-Lead   Recommendations:   Carla Lewis is a 75 y.o. Caucasian female patient who was previously seen Dr. Martinique for paroxysmal atrial fibrillation (first episode in March 2020 while visiting [place], [place]), no further recurrence except 3 months ago patient had 10 minutes of atrial fibrillation noted on apple watch, hypertension, hyperlipidemia, GERD.  She has severe tortuosity involving the carotid arteries and had falsely elevated velocity.  But otherwise minimal plaque was evident.  She now presents to establish care with me as her primary cardiologist, Carla Merle, NP retired.  She is presently asymptomatic.  She is accompanied by her husband.  Her physical examination is unremarkable except for very soft murmur at the apex.  We had a long discussion with the patient's husband and the patient regarding mutual decision regarding continuing anticoagulation versus being off of anticoagulation as her CHA2DS2-VASc risk score is very low.  Patient at this time would continue Eliquis.  Her blood pressure is well controlled, lipids are also well controlled.  External labs reviewed.  Renal function and hemoglobin stable.  No changes in the medications were done today.  She does have mild to moderate MR, but do not think she needs a  repeat echocardiogram in the absence of any symptoms.  I will see her back in 6 months and if she remains stable on annual basis.  This was a 70-minute office visit encounter in review of external records, review of outside echocardiogram and discussions regarding long-term anticoagulation and management of high risk medication use.  Husband present at the bedside and all questions answered.    Carla Prows, MD, [facility] 04/23/2021, 4:54 PM Office: [phone]

## 2021-05-27 DIAGNOSIS — Z79899 Other long term (current) drug therapy: Secondary | ICD-10-CM | POA: Diagnosis not present

## 2021-05-27 DIAGNOSIS — E7800 Pure hypercholesterolemia, unspecified: Secondary | ICD-10-CM | POA: Diagnosis not present

## 2021-05-27 DIAGNOSIS — E559 Vitamin D deficiency, unspecified: Secondary | ICD-10-CM | POA: Diagnosis not present

## 2021-06-02 DIAGNOSIS — K045 Chronic apical periodontitis: Secondary | ICD-10-CM | POA: Diagnosis not present

## 2021-06-12 DIAGNOSIS — E7800 Pure hypercholesterolemia, unspecified: Secondary | ICD-10-CM | POA: Diagnosis not present

## 2021-06-12 DIAGNOSIS — Z9181 History of falling: Secondary | ICD-10-CM | POA: Diagnosis not present

## 2021-06-12 DIAGNOSIS — I4891 Unspecified atrial fibrillation: Secondary | ICD-10-CM | POA: Diagnosis not present

## 2021-06-12 DIAGNOSIS — Z6825 Body mass index (BMI) 25.0-25.9, adult: Secondary | ICD-10-CM | POA: Diagnosis not present

## 2021-06-12 DIAGNOSIS — Z1331 Encounter for screening for depression: Secondary | ICD-10-CM | POA: Diagnosis not present

## 2021-06-12 DIAGNOSIS — D6869 Other thrombophilia: Secondary | ICD-10-CM | POA: Diagnosis not present

## 2021-06-12 DIAGNOSIS — I10 Essential (primary) hypertension: Secondary | ICD-10-CM | POA: Diagnosis not present

## 2021-06-12 DIAGNOSIS — R69 Illness, unspecified: Secondary | ICD-10-CM | POA: Diagnosis not present

## 2021-09-08 DIAGNOSIS — Z9181 History of falling: Secondary | ICD-10-CM | POA: Diagnosis not present

## 2021-09-08 DIAGNOSIS — Z1331 Encounter for screening for depression: Secondary | ICD-10-CM | POA: Diagnosis not present

## 2021-09-08 DIAGNOSIS — Z0000 Encounter for general adult medical examination without abnormal findings: Secondary | ICD-10-CM | POA: Diagnosis not present

## 2021-09-08 DIAGNOSIS — E785 Hyperlipidemia, unspecified: Secondary | ICD-10-CM | POA: Diagnosis not present

## 2021-10-27 ENCOUNTER — Ambulatory Visit: Payer: Medicare HMO | Admitting: Cardiology

## 2021-10-27 ENCOUNTER — Encounter: Payer: Self-pay | Admitting: Cardiology

## 2021-10-27 ENCOUNTER — Other Ambulatory Visit: Payer: Self-pay

## 2021-10-27 VITALS — BP 137/77 | HR 67 | Temp 97.3°F | Resp 16 | Ht 68.0 in | Wt 186.0 lb

## 2021-10-27 DIAGNOSIS — I480 Paroxysmal atrial fibrillation: Secondary | ICD-10-CM

## 2021-10-27 DIAGNOSIS — Z79899 Other long term (current) drug therapy: Secondary | ICD-10-CM

## 2021-10-27 DIAGNOSIS — I10 Essential (primary) hypertension: Secondary | ICD-10-CM

## 2021-10-27 NOTE — Progress Notes (Signed)
Primary Physician/Referring:  Leonides Sake, MD  Patient ID: Carla Lewis, female    DOB: 12/17/1945, 76 y.o.   MRN: [id number]  Chief Complaint  Patient presents with   Atrial Fibrillation   Hypertension   Follow-up    6 months   HPI:    Carla Lewis  is a 76 y.o. Caucasian female patient who was previously seen Dr. Martinique for paroxysmal atrial fibrillation (first episode in March 2020 while visiting [place], [place]), another episode for just 10 minutes in 2022, patient wears an apple watch with EKG monitoring capacity and she monitors this on a daily basis.  She remains asymptomatic.     Past Medical History:  Diagnosis Date   Ankle bruise    SPONTANEOUS BRUISE ON RIGHT ANKLE   Anxiety    Complication of anesthesia    GERD (gastroesophageal reflux disease)    History of hiatal hernia    Hyperlipidemia    Hypertension    Kidney cysts    Liver cyst    Liver cyst    Lumbar stenosis    LVH (left ventricular hypertrophy)    MILD LVH per echo in 2007   Mitral valve regurgitation    Obesity    Palpitations    PONV (postoperative nausea and vomiting)    Past Surgical History:  Procedure Laterality Date   BREAST LUMPECTOMY Right 1995   RIGHT BREAST   CHOLECYSTECTOMY     ESOPHAGEAL MANOMETRY N/A 06/23/2017   Procedure: ESOPHAGEAL MANOMETRY (EM);  Surgeon: Juanita Craver, MD;  Location: [facility];  Service: Endoscopy;  Laterality: N/A;   INSERTION OF MESH N/A 05/31/2018   Procedure: INSERTION OF MESH;  Surgeon: Ralene Ok, MD;  Location: [facility];  Service: General;  Laterality: N/A;   LIPOMA EXCISION     MENISCUS REPAIR Left    US ECHOCARDIOGRAPHY  2007   SHOWED EF OF 55-60%   Family History  Problem Relation Age of Onset   Dementia Mother 53       Passed away at 35   Heart disease Mother        cardiomyopathy   Heart disease Father 47   Hypertension Father 80   AAA (abdominal aortic aneurysm) Father     Social History   Tobacco  Use   Smoking status: Never   Smokeless tobacco: Never  Substance Use Topics   Alcohol use: No   Marital Status: Married  ROS  Review of Systems  Cardiovascular:  Positive for palpitations. Negative for chest pain, dyspnea on exertion and leg swelling.  Gastrointestinal:  Negative for melena.  Objective  Blood pressure 137/77, pulse 67, temperature (!) 97.3 F (36.3 C), temperature source Temporal, resp. rate 16, height 5\' 8"  (1.727 m), weight 186 lb (84.4 kg), SpO2 97 %. Body mass index is 28.28 kg/m.  Vitals with BMI 10/27/2021 04/23/2021 11/27/2020  Height 5\' 8"  5\' 8"  5\' 8"   Weight 186 lbs 183 lbs 5 oz 187 lbs  BMI 28.29 54.62 70.35  Systolic 009 381 829  Diastolic 77 75 80  Pulse 67 70 62     Physical Exam Constitutional:      Appearance: Normal appearance.  Neck:     Vascular: No carotid bruit or JVD.  Cardiovascular:     Rate and Rhythm: Normal rate and regular rhythm.     Pulses: Intact distal pulses.     Heart sounds: Normal heart sounds. No murmur heard.   No gallop.  Pulmonary:  Effort: Pulmonary effort is normal.     Breath sounds: Normal breath sounds.  Abdominal:     General: Bowel sounds are normal.     Palpations: Abdomen is soft.  Musculoskeletal:        General: No swelling.  Skin:    Capillary Refill: Capillary refill takes less than 2 seconds.  Neurological:     General: No focal deficit present.     Laboratory examination:   HEMOGLOBIN A1C Lab Results  Component Value Date   HGBA1C 5.7 (H) 05/01/2019   Cholesterol, total 132.000 m 11/26/2020 HDL 54.000 mg 11/26/2020 LDL 65.000 mg 11/26/2020 Triglycerides 62.000 mg 11/26/2020  A1C 5.600 % 09/26/2020 TSH 1.760 05/01/2019  Hemoglobin 14.600 g/d 11/26/2020 Platelets 430.000 x1 11/26/2020  Creatinine, Serum 0.730 mg/ 11/26/2020 Potassium 4.000 mm 11/26/2020 ALT (SGPT) 11.000 IU/ 11/26/2020  Medications and allergies   Allergies  Allergen Reactions   Darvon [Propoxyphene] Anaphylaxis   Codeine      UNSPECIFIED REACTION    Sulfa Drugs Cross Reactors     UNSPECIFIED REACTION    Bystolic [Nebivolol Hcl] Other (See Comments)    No energy and lightheadedness.   Diovan [Valsartan] Anxiety    Medication prior to this encounter:   Outpatient Medications Prior to Visit  Medication Sig Dispense Refill   acetaminophen (TYLENOL) 500 MG tablet Take 500-1,000 mg by mouth every 8 (eight) hours as needed for mild pain or moderate pain.     apixaban (ELIQUIS) 5 MG TABS tablet Take 1 tablet (5 mg total) by mouth 2 (two) times daily. 180 tablet 4   cetirizine (ZYRTEC) 10 MG tablet Take 10 mg by mouth daily.     Collagen 500 MG CAPS Take 2 capsules (1,000 mg total) by mouth daily. 60 capsule 0   ELDERBERRY PO Take by mouth.     fluticasone (FLONASE) 50 MCG/ACT nasal spray Place 2 sprays into both nostrils daily as needed for allergies. After sinus rinses     lisinopril-hydrochlorothiazide (ZESTORETIC) 20-25 MG tablet Take 1 tablet by mouth daily. OFFICE VISIT REQUIRED PRIOR TO ANY FURTHER REFILLS 30 tablet 0   montelukast (SINGULAIR) 10 MG tablet Take 10 mg by mouth daily.     pravastatin (PRAVACHOL) 20 MG tablet Take 1 tablet (20 mg total) by mouth daily. OFFICE VISIT REQUIRED PRIOR TO ANY FURTHER REFILLS 30 tablet 0   sertraline (ZOLOFT) 25 MG tablet Take 1 tablet (25 mg total) by mouth daily. **PATIENT NEEDS APT FOR FURTHER REFILLS** 30 tablet 0   diltiazem (CARDIZEM CD) 120 MG 24 hr capsule Take 1 capsule (120 mg total) by mouth daily. 90 capsule 3   No facility-administered medications prior to visit.     FINAL MEDICATION AS OF TODAY:   Medications after current encounter Current Outpatient Medications  Medication Instructions   acetaminophen (TYLENOL) 500-1,000 mg, Oral, Every 8 hours PRN   apixaban (ELIQUIS) 5 mg, Oral, 2 times daily   cetirizine (ZYRTEC) 10 mg, Oral, Daily   Collagen 1,000 mg, Oral, Daily   diltiazem (CARDIZEM CD) 120 mg, Oral, Daily   ELDERBERRY PO Oral   fluticasone  (FLONASE) 50 MCG/ACT nasal spray 2 sprays, Each Nare, Daily PRN, After sinus rinses   lisinopril-hydrochlorothiazide (ZESTORETIC) 20-25 MG tablet 1 tablet, Oral, Daily, OFFICE VISIT REQUIRED PRIOR TO ANY FURTHER REFILLS   montelukast (SINGULAIR) 10 mg, Oral, Daily   pravastatin (PRAVACHOL) 20 mg, Oral, Daily, OFFICE VISIT REQUIRED PRIOR TO ANY FURTHER REFILLS   sertraline (ZOLOFT) 25 mg, Oral, Daily, **PATIENT NEEDS  APT FOR FURTHER REFILLS**   Radiology:   No results found.  Cardiac Studies:   Nuclear stress test 02/27/2009: Normal perfusion.    Carotid artery duplex 07/10/2020: Right Carotid: Velocities in the right ICA are consistent with a 1-39% stenosis. Left Carotid: Velocities in the left ICA are consistent with a 1-39% stenosis. Vertebrals:  Bilateral vertebral arteries demonstrate antegrade flow. Subclavians: Normal flow hemodynamics were seen in bilateral subclavian  arteries. No significant change from 06/07/2019.  Echocardiogram 11/29/2019:  1. Left ventricular ejection fraction, by estimation, is 60 to 65%. The left ventricle has normal function. The left ventrical has no regional wall motion abnormalities. There is mildly increased left ventricular hypertrophy. Left ventricular diastolic parameters were likely normal with medial tissue Doppler of 7 cm/s.  2. Right ventricular systolic function is normal. The right ventricular size is normal.  3. The mitral valve is normal in structure and function. Mild to moderate mitral valve regurgitation by visual estimate, quantitation not performed. No evidence of mitral stenosis.  4. Tricuspid valve regurgitation is mild to moderate.  5. The aortic valve is tricuspid. Aortic valve regurgitation is not visualized. No aortic stenosis is present.  EKG:   EKG 10/19/2021: Normal sinus rhythm at rate of 66 bpm, left atrial enlargement, no evidence of ischemia, normal EKG.  No significant change from 04/23/2021.  Assessment     ICD-10-CM    1. Essential hypertension  I10 EKG 12-Lead    2. PAF (paroxysmal atrial fibrillation) (HCC)  I48.0     3. High risk medication use  Z79.899       CHA2DS2-VASc Score is 2.  Yearly risk of stroke: 2.3% (F, HTN, 3 if mild carotid atherosclerosis taken into consideration).  Score of 1=0.6; 2=2.2; 3=3.2; 4=4.8; 5=7.2; 6=9.8; 7=>9.8) -(CHF; HTN; vasc disease DM,  Female = 1; Age <65 =0; 65-74 = 1,  >75 =2; stroke/embolism= 2).   There are no discontinued medications.   No orders of the defined types were placed in this encounter.   Orders Placed This Encounter  Procedures   EKG 12-Lead   Recommendations:   Carla Lewis is a 76 y.o. Caucasian female patient who was previously seen Dr. Martinique for paroxysmal atrial fibrillation (first episode in March 2020 while visiting [place], [place]), she had 1 more episode for 10 minutes in 2022 and she wears an apple watch and monitors her heart rate regularly.  Had seen her 6 months ago.  Blood pressure is well controlled, physical examination is normal.  Her chads vascular score is very low, she prefers to continue anticoagulation.  Physical exam is unremarkable, EKG is also normal.  We again discussed high risk medication use and risk of bleeding.  No changes were done by me today, her prior echocardiogram and revealed moderate tricuspid regurgitation however on physical exam no murmur is appreciated and there is no clinical evidence of heart failure and she denies any dyspnea or palpitations.  We will continue to monitor this clinically.  I will see her back in a year or sooner if problems.     As she has a smart watch, and she monitors this on a daily basis, we could simply discontinue anticoagulation in view of low risk.  I will leave it to the patient to decide on this.   Adrian Prows, MD, [facility] 10/27/2021, 11:02 AM Office: [phone]

## 2021-11-27 ENCOUNTER — Other Ambulatory Visit: Payer: Self-pay | Admitting: Family Medicine

## 2021-11-27 DIAGNOSIS — Z1231 Encounter for screening mammogram for malignant neoplasm of breast: Secondary | ICD-10-CM

## 2021-12-02 ENCOUNTER — Telehealth: Payer: Self-pay

## 2021-12-02 MED ORDER — DILTIAZEM HCL ER COATED BEADS 120 MG PO CP24: 120.0000 mg | ORAL_CAPSULE | Freq: Every day | ORAL | 3 refills | Status: DC

## 2021-12-02 NOTE — Telephone Encounter (Signed)
Error

## 2021-12-31 ENCOUNTER — Ambulatory Visit
Admission: RE | Admit: 2021-12-31 | Discharge: 2021-12-31 | Disposition: A | Discharge status: Home / Self Care | Payer: Medicare HMO | Source: Ambulatory Visit | Attending: Family Medicine | Admitting: Family Medicine

## 2021-12-31 DIAGNOSIS — Z1231 Encounter for screening mammogram for malignant neoplasm of breast: Secondary | ICD-10-CM

## 2022-03-12 DIAGNOSIS — H18413 Arcus senilis, bilateral: Secondary | ICD-10-CM | POA: Diagnosis not present

## 2022-03-12 DIAGNOSIS — H25043 Posterior subcapsular polar age-related cataract, bilateral: Secondary | ICD-10-CM | POA: Diagnosis not present

## 2022-03-12 DIAGNOSIS — H25013 Cortical age-related cataract, bilateral: Secondary | ICD-10-CM | POA: Diagnosis not present

## 2022-03-12 DIAGNOSIS — H2512 Age-related nuclear cataract, left eye: Secondary | ICD-10-CM | POA: Diagnosis not present

## 2022-03-12 DIAGNOSIS — H2513 Age-related nuclear cataract, bilateral: Secondary | ICD-10-CM | POA: Diagnosis not present

## 2022-03-12 DIAGNOSIS — H35372 Puckering of macula, left eye: Secondary | ICD-10-CM | POA: Diagnosis not present

## 2022-04-03 DIAGNOSIS — M545 Low back pain, unspecified: Secondary | ICD-10-CM | POA: Diagnosis not present

## 2022-05-20 DIAGNOSIS — M47816 Spondylosis without myelopathy or radiculopathy, lumbar region: Secondary | ICD-10-CM | POA: Diagnosis not present

## 2022-05-20 DIAGNOSIS — M4316 Spondylolisthesis, lumbar region: Secondary | ICD-10-CM | POA: Diagnosis not present

## 2022-06-08 DIAGNOSIS — H2512 Age-related nuclear cataract, left eye: Secondary | ICD-10-CM | POA: Diagnosis not present

## 2022-06-09 DIAGNOSIS — H2511 Age-related nuclear cataract, right eye: Secondary | ICD-10-CM | POA: Diagnosis not present

## 2022-06-15 DIAGNOSIS — E7800 Pure hypercholesterolemia, unspecified: Secondary | ICD-10-CM | POA: Diagnosis not present

## 2022-06-15 DIAGNOSIS — Z79899 Other long term (current) drug therapy: Secondary | ICD-10-CM | POA: Diagnosis not present

## 2022-06-15 DIAGNOSIS — M810 Age-related osteoporosis without current pathological fracture: Secondary | ICD-10-CM | POA: Diagnosis not present

## 2022-06-16 DIAGNOSIS — Z961 Presence of intraocular lens: Secondary | ICD-10-CM | POA: Diagnosis not present

## 2022-06-16 DIAGNOSIS — H2512 Age-related nuclear cataract, left eye: Secondary | ICD-10-CM | POA: Diagnosis not present

## 2022-06-16 DIAGNOSIS — H52222 Regular astigmatism, left eye: Secondary | ICD-10-CM | POA: Diagnosis not present

## 2022-06-16 DIAGNOSIS — H5202 Hypermetropia, left eye: Secondary | ICD-10-CM | POA: Diagnosis not present

## 2022-06-16 DIAGNOSIS — Z9842 Cataract extraction status, left eye: Secondary | ICD-10-CM | POA: Diagnosis not present

## 2022-06-18 ENCOUNTER — Other Ambulatory Visit: Payer: Self-pay | Admitting: Family Medicine

## 2022-06-18 DIAGNOSIS — M810 Age-related osteoporosis without current pathological fracture: Secondary | ICD-10-CM

## 2022-06-18 DIAGNOSIS — E871 Hypo-osmolality and hyponatremia: Secondary | ICD-10-CM | POA: Diagnosis not present

## 2022-06-18 DIAGNOSIS — I10 Essential (primary) hypertension: Secondary | ICD-10-CM | POA: Diagnosis not present

## 2022-06-18 DIAGNOSIS — Z6826 Body mass index (BMI) 26.0-26.9, adult: Secondary | ICD-10-CM | POA: Diagnosis not present

## 2022-06-18 DIAGNOSIS — E7800 Pure hypercholesterolemia, unspecified: Secondary | ICD-10-CM | POA: Diagnosis not present

## 2022-06-18 DIAGNOSIS — F419 Anxiety disorder, unspecified: Secondary | ICD-10-CM | POA: Diagnosis not present

## 2022-06-18 DIAGNOSIS — I4891 Unspecified atrial fibrillation: Secondary | ICD-10-CM | POA: Diagnosis not present

## 2022-06-18 DIAGNOSIS — D6869 Other thrombophilia: Secondary | ICD-10-CM | POA: Diagnosis not present

## 2022-06-29 DIAGNOSIS — H2511 Age-related nuclear cataract, right eye: Secondary | ICD-10-CM | POA: Diagnosis not present

## 2022-07-06 DIAGNOSIS — H2511 Age-related nuclear cataract, right eye: Secondary | ICD-10-CM | POA: Diagnosis not present

## 2022-07-17 DIAGNOSIS — E871 Hypo-osmolality and hyponatremia: Secondary | ICD-10-CM | POA: Diagnosis not present

## 2022-09-16 DIAGNOSIS — Z1331 Encounter for screening for depression: Secondary | ICD-10-CM | POA: Diagnosis not present

## 2022-09-16 DIAGNOSIS — Z9181 History of falling: Secondary | ICD-10-CM | POA: Diagnosis not present

## 2022-09-16 DIAGNOSIS — E871 Hypo-osmolality and hyponatremia: Secondary | ICD-10-CM | POA: Diagnosis not present

## 2022-09-16 DIAGNOSIS — Z6828 Body mass index (BMI) 28.0-28.9, adult: Secondary | ICD-10-CM | POA: Diagnosis not present

## 2022-09-16 DIAGNOSIS — G4700 Insomnia, unspecified: Secondary | ICD-10-CM | POA: Diagnosis not present

## 2022-09-16 DIAGNOSIS — Z0000 Encounter for general adult medical examination without abnormal findings: Secondary | ICD-10-CM | POA: Diagnosis not present

## 2022-09-16 DIAGNOSIS — I10 Essential (primary) hypertension: Secondary | ICD-10-CM | POA: Diagnosis not present

## 2022-10-10 ENCOUNTER — Other Ambulatory Visit: Payer: Self-pay | Admitting: Cardiology

## 2022-10-26 ENCOUNTER — Ambulatory Visit: Payer: PPO | Admitting: Cardiology

## 2022-10-26 ENCOUNTER — Encounter: Payer: Self-pay | Admitting: Cardiology

## 2022-10-26 VITALS — BP 130/78 | HR 69 | Ht 68.0 in | Wt 186.7 lb

## 2022-10-26 DIAGNOSIS — Z79899 Other long term (current) drug therapy: Secondary | ICD-10-CM

## 2022-10-26 DIAGNOSIS — I10 Essential (primary) hypertension: Secondary | ICD-10-CM

## 2022-10-26 DIAGNOSIS — I480 Paroxysmal atrial fibrillation: Secondary | ICD-10-CM | POA: Diagnosis not present

## 2022-10-26 NOTE — Progress Notes (Signed)
Primary Physician/Referring:  Leonides Sake, MD  Patient ID: Carla Lewis, female    DOB: Oct 12, 1946, 77 y.o.   MRN: [id number]  Chief Complaint  Patient presents with   Hypertension   Hyperlipidemia   Follow-up    1 year   HPI:    Carla Lewis  is a 77 y.o. Caucasian female patient who was previously seen Dr. Martinique for paroxysmal atrial fibrillation (first episode in March 2020 while visiting [place], [place]), another episode for just 10 minutes in 2022, patient wears an apple watch with EKG monitoring capacity and she monitors this on a daily basis.  She remains asymptomatic.     She has had occasional episodes of palpitation but very short lasting.  Over the past several months she has noticed occasional episodes of dizzy spells/head spinning.  This is not necessarily related to position of her body.  No syncope.  Denies chest pain, dyspnea.  Continues to remain active. Past Medical History:  Diagnosis Date   Ankle bruise    SPONTANEOUS BRUISE ON RIGHT ANKLE   Anxiety    Complication of anesthesia    GERD (gastroesophageal reflux disease)    History of hiatal hernia    Hyperlipidemia    Hypertension    Kidney cysts    Liver cyst    Liver cyst    Lumbar stenosis    LVH (left ventricular hypertrophy)    MILD LVH per echo in 2007   Mitral valve regurgitation    Obesity    Palpitations    PONV (postoperative nausea and vomiting)    Past Surgical History:  Procedure Laterality Date   BREAST LUMPECTOMY Right 1995   RIGHT BREAST   cataract Bilateral    2023   CHOLECYSTECTOMY     ESOPHAGEAL MANOMETRY N/A 06/23/2017   Procedure: ESOPHAGEAL MANOMETRY (EM);  Surgeon: Juanita Craver, MD;  Location: [facility];  Service: Endoscopy;  Laterality: N/A;   INSERTION OF MESH N/A 05/31/2018   Procedure: INSERTION OF MESH;  Surgeon: Ralene Ok, MD;  Location: [facility];  Service: General;  Laterality: N/A;   LIPOMA EXCISION     MENISCUS REPAIR Left     US ECHOCARDIOGRAPHY  2007   SHOWED EF OF 55-60%   Family History  Problem Relation Age of Onset   Dementia Mother 68       Passed away at 2   Heart disease Mother        cardiomyopathy   Heart disease Father 6   Hypertension Father 5   AAA (abdominal aortic aneurysm) Father     Social History   Tobacco Use   Smoking status: Never   Smokeless tobacco: Never  Substance Use Topics   Alcohol use: No   Marital Status: Married  ROS  Review of Systems  Cardiovascular:  Positive for palpitations. Negative for chest pain, dyspnea on exertion and leg swelling.   Objective  Blood pressure 130/78, pulse 69, height '5\' 8"'$  (1.727 m), weight 192 lb (87.1 kg), SpO2 98 %. Body mass index is 29.19 kg/m.     10/26/2022   10:51 AM 10/26/2022   10:36 AM 10/27/2021   10:41 AM  Vitals with BMI  Height  '5\' 8"'$  '5\' 8"'$   Weight  192 lbs 186 lbs  BMI  27.0 35.00  Systolic 938 182 993  Diastolic 78 79 77  Pulse  69 67     Physical Exam Neck:     Vascular: No carotid bruit or JVD.  Cardiovascular:     Rate and Rhythm: Normal rate and regular rhythm.     Pulses: Intact distal pulses.     Heart sounds: Normal heart sounds. No murmur heard.    No gallop.  Pulmonary:     Effort: Pulmonary effort is normal.     Breath sounds: Normal breath sounds.  Abdominal:     General: Bowel sounds are normal.     Palpations: Abdomen is soft.  Musculoskeletal:        General: No swelling.     Laboratory examination:   External labs:  Cholesterol, total 153.000 m 06/15/2022 HDL 55.000 mg 06/15/2022 LDL 85.000 mg 06/15/2022 Triglycerides 65.000 mg 06/15/2022  Hemoglobin 14.800 g/d 06/15/2022 Platelets 318.000 x1 06/15/2022  Creatinine, Serum 0.670 mg/ 09/16/2022 Potassium 5.100 mm 09/16/2022 ALT (SGPT) 13.000 IU/ 06/15/2022  TSH 3.090 07/17/2022  A1C 5.600 % 09/26/2020  Medications and allergies   Allergies  Allergen Reactions   Darvon [Propoxyphene] Anaphylaxis   Codeine     UNSPECIFIED  REACTION    Misc. Sulfonamide Containing Compounds    Sulfa Drugs Cross Reactors     UNSPECIFIED REACTION    Bystolic [Nebivolol Hcl] Other (See Comments)    No energy and lightheadedness.   Diovan [Valsartan] Anxiety     FINAL MEDICATION AS OF TODAY:   Current Outpatient Medications:    acetaminophen (TYLENOL) 500 MG tablet, Take 500-1,000 mg by mouth every 8 (eight) hours as needed for mild pain or moderate pain., Disp: , Rfl:    apixaban (ELIQUIS) 5 MG TABS tablet, Take 1 tablet (5 mg total) by mouth 2 (two) times daily., Disp: 180 tablet, Rfl: 4   Collagen 500 MG CAPS, Take 2 capsules (1,000 mg total) by mouth daily., Disp: 60 capsule, Rfl: 0   diltiazem (CARDIZEM CD) 120 MG 24 hr capsule, TAKE 1 CAPSULE BY MOUTH EVERY DAY, Disp: 90 capsule, Rfl: 3   ELDERBERRY PO, Take by mouth., Disp: , Rfl:    fluticasone (FLONASE) 50 MCG/ACT nasal spray, Place 2 sprays into both nostrils daily as needed for allergies. After sinus rinses, Disp: , Rfl:    lisinopril-hydrochlorothiazide (ZESTORETIC) 20-25 MG tablet, Take 1 tablet by mouth daily. OFFICE VISIT REQUIRED PRIOR TO ANY FURTHER REFILLS, Disp: 30 tablet, Rfl: 0   montelukast (SINGULAIR) 10 MG tablet, Take 10 mg by mouth daily., Disp: , Rfl:    pravastatin (PRAVACHOL) 20 MG tablet, Take 1 tablet (20 mg total) by mouth daily. OFFICE VISIT REQUIRED PRIOR TO ANY FURTHER REFILLS, Disp: 30 tablet, Rfl: 0   sertraline (ZOLOFT) 25 MG tablet, Take 1 tablet (25 mg total) by mouth daily. **PATIENT NEEDS APT FOR FURTHER REFILLS**, Disp: 30 tablet, Rfl: 0  Radiology:   No results found.  Cardiac Studies:   Nuclear stress test 02/27/2009: Normal perfusion.    Carotid artery duplex 07/10/2020: Right Carotid: Velocities in the right ICA are consistent with a 1-39% stenosis. Left Carotid: Velocities in the left ICA are consistent with a 1-39% stenosis. Vertebrals:  Bilateral vertebral arteries demonstrate antegrade flow. Subclavians: Normal flow  hemodynamics were seen in bilateral subclavian  arteries. No significant change from 06/07/2019.  Echocardiogram 11/29/2019:  1. Left ventricular ejection fraction, by estimation, is 60 to 65%. The left ventricle has normal function. The left ventrical has no regional wall motion abnormalities. There is mildly increased left ventricular hypertrophy. Left ventricular diastolic parameters were likely normal with medial tissue Doppler of 7 cm/s.  2. Right ventricular systolic function is normal. The right ventricular  size is normal.  3. The mitral valve is normal in structure and function. Mild to moderate mitral valve regurgitation by visual estimate, quantitation not performed. No evidence of mitral stenosis.  4. Tricuspid valve regurgitation is mild to moderate.  5. The aortic valve is tricuspid. Aortic valve regurgitation is not visualized. No aortic stenosis is present.  EKG:   EKG 10/26/2022: Normal sinus rhythm at rate of 61 bpm, leftward axis, normal QT interval.  EKG within normal limits.  No significant change from 10/27/2021.  Assessment     ICD-10-CM   1. PAF (paroxysmal atrial fibrillation) (HCC)  I48.0 EKG 12-Lead    2. Essential hypertension  I10     3. High risk medication use  Z79.899       CHA2DS2-VASc Score is 3.  Yearly risk of stroke: 3.2% (A, F, HTN, 4 if mild carotid atherosclerosis taken into consideration).  Score of 1=0.6; 2=2.2; 3=3.2; 4=4.8; 5=7.2; 6=9.8; 7=>9.8) -(CHF; HTN; vasc disease DM,  Female = 1; Age <65 =0; 65-74 = 1,  >75 =2; stroke/embolism= 2).   Medications Discontinued During This Encounter  Medication Reason   cetirizine (ZYRTEC) 10 MG tablet Patient Preference     No orders of the defined types were placed in this encounter.   Orders Placed This Encounter  Procedures   EKG 12-Lead   Recommendations:   Carla Lewis is a 77 y.o. Caucasian female patient who was previously seen Dr. Martinique for paroxysmal atrial fibrillation (first  episode in March 2020 while visiting [place], [place]), she had 1 more episode for 10 minutes in 2022 and she wears an apple watch and monitors her heart rate regularly.  1. PAF (paroxysmal atrial fibrillation) ([place]) She has had occasional episodes of palpitations, but overall maintaining sinus rhythm.  No change in physical exam or EKG.  Continue present medications.  2. Essential hypertension Initially blood pressure was elevated, but home blood pressure recordings have been under excellent control, I did not make any changes to her medication. Labs reviewed, renal function has remained stable.  She temporarily had low sodium and this could have been nonspecific.  3. High risk medication use She is presently on anticoagulant with Eliquis, her CHA2DS2-VASc score is at least 3 and she preferred to be on anticoagulation.  Continue the same.  Hemoglobin has remained stable.  Otherwise stable from cardiac standpoint, I will see her back in a year or sooner if problems.    Adrian Prows, MD, [facility] 10/26/2022, 10:55 AM Office: [phone]

## 2022-10-29 DIAGNOSIS — Z961 Presence of intraocular lens: Secondary | ICD-10-CM | POA: Diagnosis not present

## 2022-10-29 DIAGNOSIS — H26493 Other secondary cataract, bilateral: Secondary | ICD-10-CM | POA: Diagnosis not present

## 2022-10-29 DIAGNOSIS — H18413 Arcus senilis, bilateral: Secondary | ICD-10-CM | POA: Diagnosis not present

## 2022-10-29 DIAGNOSIS — H35372 Puckering of macula, left eye: Secondary | ICD-10-CM | POA: Diagnosis not present

## 2022-10-29 DIAGNOSIS — H26492 Other secondary cataract, left eye: Secondary | ICD-10-CM | POA: Diagnosis not present

## 2022-11-04 DIAGNOSIS — Z9842 Cataract extraction status, left eye: Secondary | ICD-10-CM | POA: Diagnosis not present

## 2022-11-04 DIAGNOSIS — H5202 Hypermetropia, left eye: Secondary | ICD-10-CM | POA: Diagnosis not present

## 2022-11-04 DIAGNOSIS — H52223 Regular astigmatism, bilateral: Secondary | ICD-10-CM | POA: Diagnosis not present

## 2022-11-04 DIAGNOSIS — H5211 Myopia, right eye: Secondary | ICD-10-CM | POA: Diagnosis not present

## 2022-11-04 DIAGNOSIS — H524 Presbyopia: Secondary | ICD-10-CM | POA: Diagnosis not present

## 2022-11-27 DIAGNOSIS — H26491 Other secondary cataract, right eye: Secondary | ICD-10-CM | POA: Diagnosis not present

## 2022-12-01 ENCOUNTER — Other Ambulatory Visit: Payer: Self-pay | Admitting: Family Medicine

## 2022-12-01 DIAGNOSIS — Z1231 Encounter for screening mammogram for malignant neoplasm of breast: Secondary | ICD-10-CM

## 2022-12-09 DIAGNOSIS — Z961 Presence of intraocular lens: Secondary | ICD-10-CM | POA: Diagnosis not present

## 2022-12-09 DIAGNOSIS — H5211 Myopia, right eye: Secondary | ICD-10-CM | POA: Diagnosis not present

## 2022-12-09 DIAGNOSIS — H524 Presbyopia: Secondary | ICD-10-CM | POA: Diagnosis not present

## 2022-12-09 DIAGNOSIS — H52222 Regular astigmatism, left eye: Secondary | ICD-10-CM | POA: Diagnosis not present

## 2022-12-09 DIAGNOSIS — H5202 Hypermetropia, left eye: Secondary | ICD-10-CM | POA: Diagnosis not present

## 2022-12-09 DIAGNOSIS — Z9841 Cataract extraction status, right eye: Secondary | ICD-10-CM | POA: Diagnosis not present

## 2022-12-11 ENCOUNTER — Inpatient Hospital Stay: Admission: RE | Admit: 2022-12-11 | Payer: Medicare HMO | Source: Ambulatory Visit

## 2022-12-21 DIAGNOSIS — E7800 Pure hypercholesterolemia, unspecified: Secondary | ICD-10-CM | POA: Diagnosis not present

## 2022-12-21 DIAGNOSIS — Z79899 Other long term (current) drug therapy: Secondary | ICD-10-CM | POA: Diagnosis not present

## 2022-12-23 DIAGNOSIS — E7800 Pure hypercholesterolemia, unspecified: Secondary | ICD-10-CM | POA: Diagnosis not present

## 2022-12-23 DIAGNOSIS — G4700 Insomnia, unspecified: Secondary | ICD-10-CM | POA: Diagnosis not present

## 2022-12-23 DIAGNOSIS — D6869 Other thrombophilia: Secondary | ICD-10-CM | POA: Diagnosis not present

## 2022-12-23 DIAGNOSIS — F419 Anxiety disorder, unspecified: Secondary | ICD-10-CM | POA: Diagnosis not present

## 2022-12-23 DIAGNOSIS — E663 Overweight: Secondary | ICD-10-CM | POA: Diagnosis not present

## 2022-12-23 DIAGNOSIS — I480 Paroxysmal atrial fibrillation: Secondary | ICD-10-CM | POA: Diagnosis not present

## 2022-12-23 DIAGNOSIS — I10 Essential (primary) hypertension: Secondary | ICD-10-CM | POA: Diagnosis not present

## 2022-12-23 DIAGNOSIS — M810 Age-related osteoporosis without current pathological fracture: Secondary | ICD-10-CM | POA: Diagnosis not present

## 2023-01-13 ENCOUNTER — Ambulatory Visit
Admission: RE | Admit: 2023-01-13 | Discharge: 2023-01-13 | Disposition: A | Discharge status: Home / Self Care | Payer: PPO | Source: Ambulatory Visit | Attending: Family Medicine | Admitting: Family Medicine

## 2023-01-13 DIAGNOSIS — Z1231 Encounter for screening mammogram for malignant neoplasm of breast: Secondary | ICD-10-CM

## 2023-01-14 DIAGNOSIS — G8929 Other chronic pain: Secondary | ICD-10-CM | POA: Diagnosis not present

## 2023-01-14 DIAGNOSIS — M25562 Pain in left knee: Secondary | ICD-10-CM | POA: Diagnosis not present

## 2023-01-14 DIAGNOSIS — M1712 Unilateral primary osteoarthritis, left knee: Secondary | ICD-10-CM | POA: Diagnosis not present

## 2023-01-20 ENCOUNTER — Encounter: Payer: Self-pay | Admitting: Cardiology

## 2023-01-29 DIAGNOSIS — G8929 Other chronic pain: Secondary | ICD-10-CM | POA: Diagnosis not present

## 2023-01-29 DIAGNOSIS — M25562 Pain in left knee: Secondary | ICD-10-CM | POA: Diagnosis not present

## 2023-02-10 DIAGNOSIS — M1712 Unilateral primary osteoarthritis, left knee: Secondary | ICD-10-CM | POA: Diagnosis not present

## 2023-02-10 DIAGNOSIS — M25762 Osteophyte, left knee: Secondary | ICD-10-CM | POA: Diagnosis not present

## 2023-02-10 DIAGNOSIS — G8918 Other acute postprocedural pain: Secondary | ICD-10-CM | POA: Diagnosis not present

## 2023-02-10 DIAGNOSIS — Z96652 Presence of left artificial knee joint: Secondary | ICD-10-CM | POA: Diagnosis not present

## 2023-02-17 DIAGNOSIS — R350 Frequency of micturition: Secondary | ICD-10-CM | POA: Diagnosis not present

## 2023-02-17 DIAGNOSIS — R300 Dysuria: Secondary | ICD-10-CM | POA: Diagnosis not present

## 2023-02-18 DIAGNOSIS — Z7409 Other reduced mobility: Secondary | ICD-10-CM | POA: Diagnosis not present

## 2023-02-18 DIAGNOSIS — Z96652 Presence of left artificial knee joint: Secondary | ICD-10-CM | POA: Diagnosis not present

## 2023-02-18 DIAGNOSIS — M25562 Pain in left knee: Secondary | ICD-10-CM | POA: Diagnosis not present

## 2023-02-18 DIAGNOSIS — M25462 Effusion, left knee: Secondary | ICD-10-CM | POA: Diagnosis not present

## 2023-02-18 DIAGNOSIS — M25662 Stiffness of left knee, not elsewhere classified: Secondary | ICD-10-CM | POA: Diagnosis not present

## 2023-02-23 DIAGNOSIS — M25562 Pain in left knee: Secondary | ICD-10-CM | POA: Diagnosis not present

## 2023-02-23 DIAGNOSIS — Z7409 Other reduced mobility: Secondary | ICD-10-CM | POA: Diagnosis not present

## 2023-02-23 DIAGNOSIS — M25662 Stiffness of left knee, not elsewhere classified: Secondary | ICD-10-CM | POA: Diagnosis not present

## 2023-02-23 DIAGNOSIS — Z96652 Presence of left artificial knee joint: Secondary | ICD-10-CM | POA: Diagnosis not present

## 2023-02-23 DIAGNOSIS — M25462 Effusion, left knee: Secondary | ICD-10-CM | POA: Diagnosis not present

## 2023-03-01 DIAGNOSIS — M25662 Stiffness of left knee, not elsewhere classified: Secondary | ICD-10-CM | POA: Diagnosis not present

## 2023-03-01 DIAGNOSIS — Z96652 Presence of left artificial knee joint: Secondary | ICD-10-CM | POA: Diagnosis not present

## 2023-03-01 DIAGNOSIS — M25562 Pain in left knee: Secondary | ICD-10-CM | POA: Diagnosis not present

## 2023-03-01 DIAGNOSIS — Z7409 Other reduced mobility: Secondary | ICD-10-CM | POA: Diagnosis not present

## 2023-03-01 DIAGNOSIS — M25462 Effusion, left knee: Secondary | ICD-10-CM | POA: Diagnosis not present

## 2023-03-04 DIAGNOSIS — Z7409 Other reduced mobility: Secondary | ICD-10-CM | POA: Diagnosis not present

## 2023-03-04 DIAGNOSIS — Z96652 Presence of left artificial knee joint: Secondary | ICD-10-CM | POA: Diagnosis not present

## 2023-03-04 DIAGNOSIS — M25662 Stiffness of left knee, not elsewhere classified: Secondary | ICD-10-CM | POA: Diagnosis not present

## 2023-03-04 DIAGNOSIS — M25462 Effusion, left knee: Secondary | ICD-10-CM | POA: Diagnosis not present

## 2023-03-04 DIAGNOSIS — M25562 Pain in left knee: Secondary | ICD-10-CM | POA: Diagnosis not present

## 2023-03-08 DIAGNOSIS — M25562 Pain in left knee: Secondary | ICD-10-CM | POA: Diagnosis not present

## 2023-03-08 DIAGNOSIS — M25462 Effusion, left knee: Secondary | ICD-10-CM | POA: Diagnosis not present

## 2023-03-08 DIAGNOSIS — M25662 Stiffness of left knee, not elsewhere classified: Secondary | ICD-10-CM | POA: Diagnosis not present

## 2023-03-08 DIAGNOSIS — Z7409 Other reduced mobility: Secondary | ICD-10-CM | POA: Diagnosis not present

## 2023-03-08 DIAGNOSIS — Z96652 Presence of left artificial knee joint: Secondary | ICD-10-CM | POA: Diagnosis not present

## 2023-03-11 DIAGNOSIS — Z96652 Presence of left artificial knee joint: Secondary | ICD-10-CM | POA: Diagnosis not present

## 2023-03-11 DIAGNOSIS — M25462 Effusion, left knee: Secondary | ICD-10-CM | POA: Diagnosis not present

## 2023-03-11 DIAGNOSIS — M25662 Stiffness of left knee, not elsewhere classified: Secondary | ICD-10-CM | POA: Diagnosis not present

## 2023-03-11 DIAGNOSIS — M25562 Pain in left knee: Secondary | ICD-10-CM | POA: Diagnosis not present

## 2023-03-11 DIAGNOSIS — Z7409 Other reduced mobility: Secondary | ICD-10-CM | POA: Diagnosis not present

## 2023-03-16 DIAGNOSIS — M25462 Effusion, left knee: Secondary | ICD-10-CM | POA: Diagnosis not present

## 2023-03-16 DIAGNOSIS — M25662 Stiffness of left knee, not elsewhere classified: Secondary | ICD-10-CM | POA: Diagnosis not present

## 2023-03-16 DIAGNOSIS — Z96652 Presence of left artificial knee joint: Secondary | ICD-10-CM | POA: Diagnosis not present

## 2023-03-16 DIAGNOSIS — M25562 Pain in left knee: Secondary | ICD-10-CM | POA: Diagnosis not present

## 2023-03-16 DIAGNOSIS — Z7409 Other reduced mobility: Secondary | ICD-10-CM | POA: Diagnosis not present

## 2023-03-18 DIAGNOSIS — M25462 Effusion, left knee: Secondary | ICD-10-CM | POA: Diagnosis not present

## 2023-03-18 DIAGNOSIS — Z96652 Presence of left artificial knee joint: Secondary | ICD-10-CM | POA: Diagnosis not present

## 2023-03-18 DIAGNOSIS — Z7409 Other reduced mobility: Secondary | ICD-10-CM | POA: Diagnosis not present

## 2023-03-18 DIAGNOSIS — M25662 Stiffness of left knee, not elsewhere classified: Secondary | ICD-10-CM | POA: Diagnosis not present

## 2023-03-18 DIAGNOSIS — M25562 Pain in left knee: Secondary | ICD-10-CM | POA: Diagnosis not present

## 2023-03-22 DIAGNOSIS — M25662 Stiffness of left knee, not elsewhere classified: Secondary | ICD-10-CM | POA: Diagnosis not present

## 2023-03-22 DIAGNOSIS — M25562 Pain in left knee: Secondary | ICD-10-CM | POA: Diagnosis not present

## 2023-03-22 DIAGNOSIS — Z7409 Other reduced mobility: Secondary | ICD-10-CM | POA: Diagnosis not present

## 2023-03-22 DIAGNOSIS — Z96652 Presence of left artificial knee joint: Secondary | ICD-10-CM | POA: Diagnosis not present

## 2023-03-22 DIAGNOSIS — M25462 Effusion, left knee: Secondary | ICD-10-CM | POA: Diagnosis not present

## 2023-03-29 DIAGNOSIS — M25462 Effusion, left knee: Secondary | ICD-10-CM | POA: Diagnosis not present

## 2023-03-29 DIAGNOSIS — Z7409 Other reduced mobility: Secondary | ICD-10-CM | POA: Diagnosis not present

## 2023-03-29 DIAGNOSIS — M25562 Pain in left knee: Secondary | ICD-10-CM | POA: Diagnosis not present

## 2023-03-29 DIAGNOSIS — M25662 Stiffness of left knee, not elsewhere classified: Secondary | ICD-10-CM | POA: Diagnosis not present

## 2023-03-29 DIAGNOSIS — Z96652 Presence of left artificial knee joint: Secondary | ICD-10-CM | POA: Diagnosis not present

## 2023-04-01 DIAGNOSIS — M25462 Effusion, left knee: Secondary | ICD-10-CM | POA: Diagnosis not present

## 2023-04-01 DIAGNOSIS — M25562 Pain in left knee: Secondary | ICD-10-CM | POA: Diagnosis not present

## 2023-04-01 DIAGNOSIS — Z96652 Presence of left artificial knee joint: Secondary | ICD-10-CM | POA: Diagnosis not present

## 2023-04-01 DIAGNOSIS — Z7409 Other reduced mobility: Secondary | ICD-10-CM | POA: Diagnosis not present

## 2023-04-01 DIAGNOSIS — M25662 Stiffness of left knee, not elsewhere classified: Secondary | ICD-10-CM | POA: Diagnosis not present

## 2023-04-05 DIAGNOSIS — M25662 Stiffness of left knee, not elsewhere classified: Secondary | ICD-10-CM | POA: Diagnosis not present

## 2023-04-05 DIAGNOSIS — M25462 Effusion, left knee: Secondary | ICD-10-CM | POA: Diagnosis not present

## 2023-04-05 DIAGNOSIS — Z96652 Presence of left artificial knee joint: Secondary | ICD-10-CM | POA: Diagnosis not present

## 2023-04-05 DIAGNOSIS — M25562 Pain in left knee: Secondary | ICD-10-CM | POA: Diagnosis not present

## 2023-04-05 DIAGNOSIS — Z7409 Other reduced mobility: Secondary | ICD-10-CM | POA: Diagnosis not present

## 2023-04-06 ENCOUNTER — Other Ambulatory Visit: Payer: Self-pay | Admitting: Family Medicine

## 2023-04-06 DIAGNOSIS — M810 Age-related osteoporosis without current pathological fracture: Secondary | ICD-10-CM

## 2023-04-08 DIAGNOSIS — Z96652 Presence of left artificial knee joint: Secondary | ICD-10-CM | POA: Diagnosis not present

## 2023-04-08 DIAGNOSIS — M25462 Effusion, left knee: Secondary | ICD-10-CM | POA: Diagnosis not present

## 2023-04-08 DIAGNOSIS — M25662 Stiffness of left knee, not elsewhere classified: Secondary | ICD-10-CM | POA: Diagnosis not present

## 2023-04-08 DIAGNOSIS — M25562 Pain in left knee: Secondary | ICD-10-CM | POA: Diagnosis not present

## 2023-04-08 DIAGNOSIS — Z7409 Other reduced mobility: Secondary | ICD-10-CM | POA: Diagnosis not present

## 2023-04-12 DIAGNOSIS — M25662 Stiffness of left knee, not elsewhere classified: Secondary | ICD-10-CM | POA: Diagnosis not present

## 2023-04-12 DIAGNOSIS — M25562 Pain in left knee: Secondary | ICD-10-CM | POA: Diagnosis not present

## 2023-04-12 DIAGNOSIS — Z7409 Other reduced mobility: Secondary | ICD-10-CM | POA: Diagnosis not present

## 2023-04-12 DIAGNOSIS — M25462 Effusion, left knee: Secondary | ICD-10-CM | POA: Diagnosis not present

## 2023-04-12 DIAGNOSIS — Z96652 Presence of left artificial knee joint: Secondary | ICD-10-CM | POA: Diagnosis not present

## 2023-04-15 DIAGNOSIS — M25562 Pain in left knee: Secondary | ICD-10-CM | POA: Diagnosis not present

## 2023-04-15 DIAGNOSIS — M25662 Stiffness of left knee, not elsewhere classified: Secondary | ICD-10-CM | POA: Diagnosis not present

## 2023-04-15 DIAGNOSIS — R29898 Other symptoms and signs involving the musculoskeletal system: Secondary | ICD-10-CM | POA: Diagnosis not present

## 2023-04-15 DIAGNOSIS — Z7409 Other reduced mobility: Secondary | ICD-10-CM | POA: Diagnosis not present

## 2023-04-15 DIAGNOSIS — Z96652 Presence of left artificial knee joint: Secondary | ICD-10-CM | POA: Diagnosis not present

## 2023-04-15 DIAGNOSIS — M25462 Effusion, left knee: Secondary | ICD-10-CM | POA: Diagnosis not present

## 2023-04-26 DIAGNOSIS — M25662 Stiffness of left knee, not elsewhere classified: Secondary | ICD-10-CM | POA: Diagnosis not present

## 2023-04-26 DIAGNOSIS — R29898 Other symptoms and signs involving the musculoskeletal system: Secondary | ICD-10-CM | POA: Diagnosis not present

## 2023-04-26 DIAGNOSIS — Z96652 Presence of left artificial knee joint: Secondary | ICD-10-CM | POA: Diagnosis not present

## 2023-04-26 DIAGNOSIS — M25462 Effusion, left knee: Secondary | ICD-10-CM | POA: Diagnosis not present

## 2023-04-26 DIAGNOSIS — Z7409 Other reduced mobility: Secondary | ICD-10-CM | POA: Diagnosis not present

## 2023-04-26 DIAGNOSIS — M25562 Pain in left knee: Secondary | ICD-10-CM | POA: Diagnosis not present

## 2023-04-29 DIAGNOSIS — Z96652 Presence of left artificial knee joint: Secondary | ICD-10-CM | POA: Diagnosis not present

## 2023-04-29 DIAGNOSIS — M25462 Effusion, left knee: Secondary | ICD-10-CM | POA: Diagnosis not present

## 2023-04-29 DIAGNOSIS — M25662 Stiffness of left knee, not elsewhere classified: Secondary | ICD-10-CM | POA: Diagnosis not present

## 2023-04-29 DIAGNOSIS — R29898 Other symptoms and signs involving the musculoskeletal system: Secondary | ICD-10-CM | POA: Diagnosis not present

## 2023-04-29 DIAGNOSIS — M25562 Pain in left knee: Secondary | ICD-10-CM | POA: Diagnosis not present

## 2023-04-29 DIAGNOSIS — Z7409 Other reduced mobility: Secondary | ICD-10-CM | POA: Diagnosis not present

## 2023-05-03 DIAGNOSIS — Z09 Encounter for follow-up examination after completed treatment for conditions other than malignant neoplasm: Secondary | ICD-10-CM | POA: Diagnosis not present

## 2023-05-03 DIAGNOSIS — M25562 Pain in left knee: Secondary | ICD-10-CM | POA: Diagnosis not present

## 2023-05-03 DIAGNOSIS — M25462 Effusion, left knee: Secondary | ICD-10-CM | POA: Diagnosis not present

## 2023-05-03 DIAGNOSIS — Z96652 Presence of left artificial knee joint: Secondary | ICD-10-CM | POA: Diagnosis not present

## 2023-05-03 DIAGNOSIS — Z7409 Other reduced mobility: Secondary | ICD-10-CM | POA: Diagnosis not present

## 2023-05-03 DIAGNOSIS — M25662 Stiffness of left knee, not elsewhere classified: Secondary | ICD-10-CM | POA: Diagnosis not present

## 2023-05-03 DIAGNOSIS — R29898 Other symptoms and signs involving the musculoskeletal system: Secondary | ICD-10-CM | POA: Diagnosis not present

## 2023-05-06 DIAGNOSIS — Z96652 Presence of left artificial knee joint: Secondary | ICD-10-CM | POA: Diagnosis not present

## 2023-05-06 DIAGNOSIS — Z7409 Other reduced mobility: Secondary | ICD-10-CM | POA: Diagnosis not present

## 2023-05-06 DIAGNOSIS — R29898 Other symptoms and signs involving the musculoskeletal system: Secondary | ICD-10-CM | POA: Diagnosis not present

## 2023-05-06 DIAGNOSIS — M25462 Effusion, left knee: Secondary | ICD-10-CM | POA: Diagnosis not present

## 2023-05-06 DIAGNOSIS — M25662 Stiffness of left knee, not elsewhere classified: Secondary | ICD-10-CM | POA: Diagnosis not present

## 2023-05-06 DIAGNOSIS — M25562 Pain in left knee: Secondary | ICD-10-CM | POA: Diagnosis not present

## 2023-05-13 ENCOUNTER — Other Ambulatory Visit: Payer: Medicare HMO

## 2023-05-17 DIAGNOSIS — Z7409 Other reduced mobility: Secondary | ICD-10-CM | POA: Diagnosis not present

## 2023-05-17 DIAGNOSIS — M25662 Stiffness of left knee, not elsewhere classified: Secondary | ICD-10-CM | POA: Diagnosis not present

## 2023-05-17 DIAGNOSIS — Z96652 Presence of left artificial knee joint: Secondary | ICD-10-CM | POA: Diagnosis not present

## 2023-05-17 DIAGNOSIS — R29898 Other symptoms and signs involving the musculoskeletal system: Secondary | ICD-10-CM | POA: Diagnosis not present

## 2023-05-17 DIAGNOSIS — M25462 Effusion, left knee: Secondary | ICD-10-CM | POA: Diagnosis not present

## 2023-05-17 DIAGNOSIS — M25562 Pain in left knee: Secondary | ICD-10-CM | POA: Diagnosis not present

## 2023-06-28 DIAGNOSIS — Z79899 Other long term (current) drug therapy: Secondary | ICD-10-CM | POA: Diagnosis not present

## 2023-06-28 DIAGNOSIS — E7800 Pure hypercholesterolemia, unspecified: Secondary | ICD-10-CM | POA: Diagnosis not present

## 2023-06-30 DIAGNOSIS — G4700 Insomnia, unspecified: Secondary | ICD-10-CM | POA: Diagnosis not present

## 2023-06-30 DIAGNOSIS — Z139 Encounter for screening, unspecified: Secondary | ICD-10-CM | POA: Diagnosis not present

## 2023-06-30 DIAGNOSIS — E663 Overweight: Secondary | ICD-10-CM | POA: Diagnosis not present

## 2023-06-30 DIAGNOSIS — I10 Essential (primary) hypertension: Secondary | ICD-10-CM | POA: Diagnosis not present

## 2023-06-30 DIAGNOSIS — E7800 Pure hypercholesterolemia, unspecified: Secondary | ICD-10-CM | POA: Diagnosis not present

## 2023-06-30 DIAGNOSIS — F419 Anxiety disorder, unspecified: Secondary | ICD-10-CM | POA: Diagnosis not present

## 2023-06-30 DIAGNOSIS — D6869 Other thrombophilia: Secondary | ICD-10-CM | POA: Diagnosis not present

## 2023-06-30 DIAGNOSIS — M810 Age-related osteoporosis without current pathological fracture: Secondary | ICD-10-CM | POA: Diagnosis not present

## 2023-06-30 DIAGNOSIS — I480 Paroxysmal atrial fibrillation: Secondary | ICD-10-CM | POA: Diagnosis not present

## 2023-08-03 DIAGNOSIS — R600 Localized edema: Secondary | ICD-10-CM | POA: Diagnosis not present

## 2023-08-03 DIAGNOSIS — R3915 Urgency of urination: Secondary | ICD-10-CM | POA: Diagnosis not present

## 2023-08-13 DIAGNOSIS — Z6828 Body mass index (BMI) 28.0-28.9, adult: Secondary | ICD-10-CM | POA: Diagnosis not present

## 2023-08-13 DIAGNOSIS — M4316 Spondylolisthesis, lumbar region: Secondary | ICD-10-CM | POA: Diagnosis not present

## 2023-08-23 IMAGING — MG MM DIGITAL SCREENING BILAT W/ TOMO AND CAD
8 series · 8 of 24 positions shown · non-contrast
Comparison: Previous exam(s).

CLINICAL DATA: Screening.

EXAM:
DIGITAL SCREENING BILATERAL MAMMOGRAM WITH TOMOSYNTHESIS AND CAD
TECHNIQUE: Bilateral screening digital craniocaudal and mediolateral oblique
mammograms were obtained. Bilateral screening digital breast
tomosynthesis was performed. The images were evaluated with
computer-aided detection.

[R CC synth-2D]
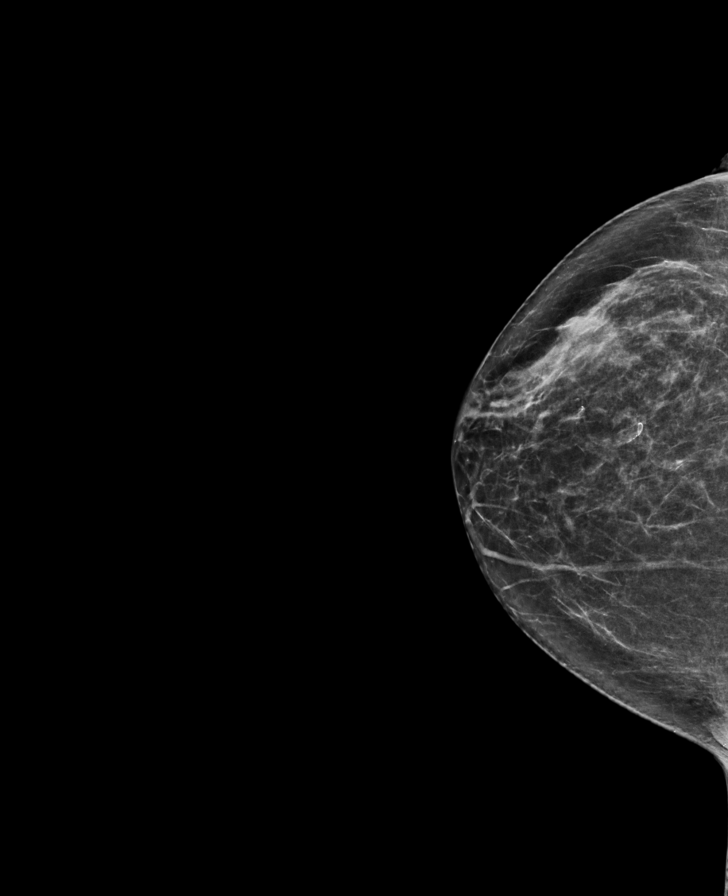

[L CC synth-2D]
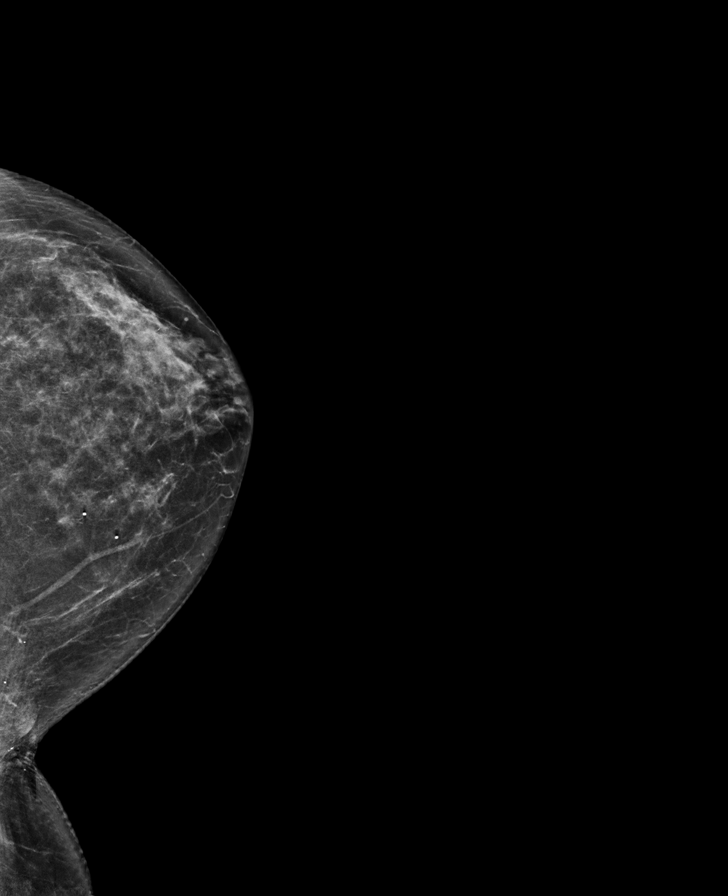

[L MLO synth-2D]
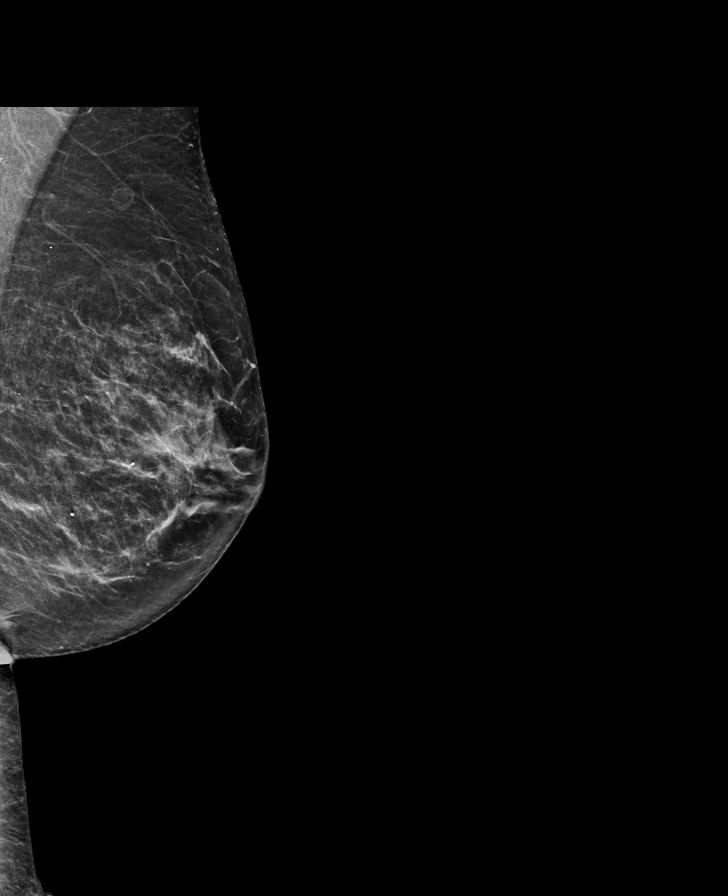

[R MLO synth-2D]
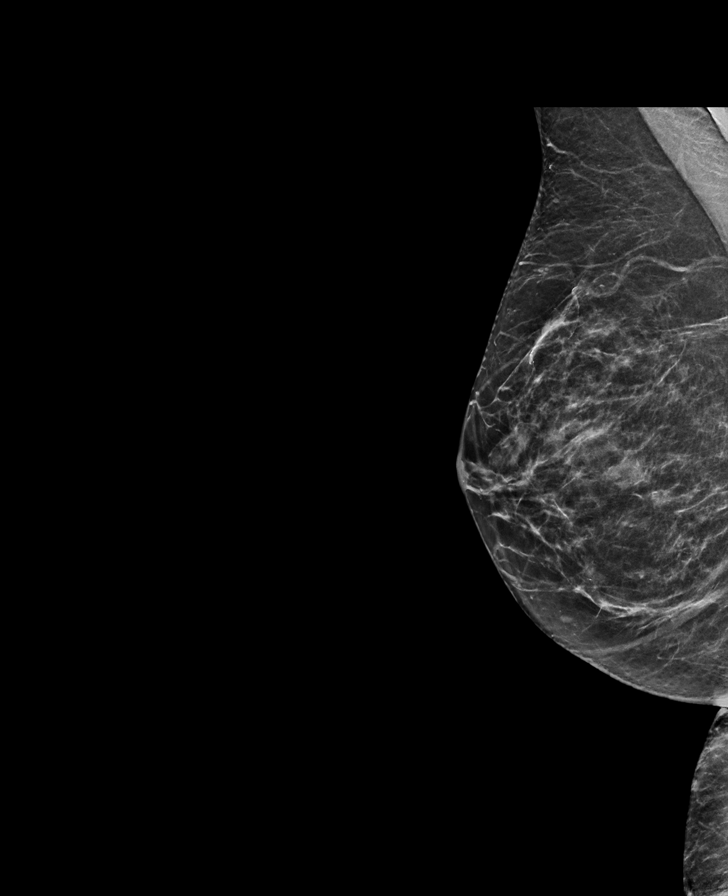

[R MLO tomo · tomo slice 32/63.0]
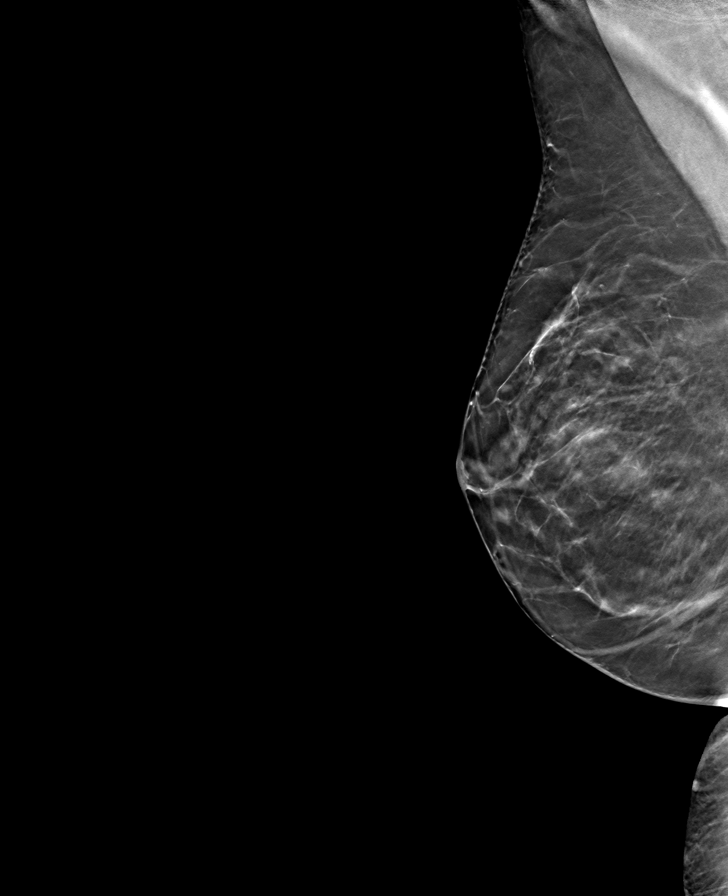

[L CC tomo · tomo slice 35/68.0]
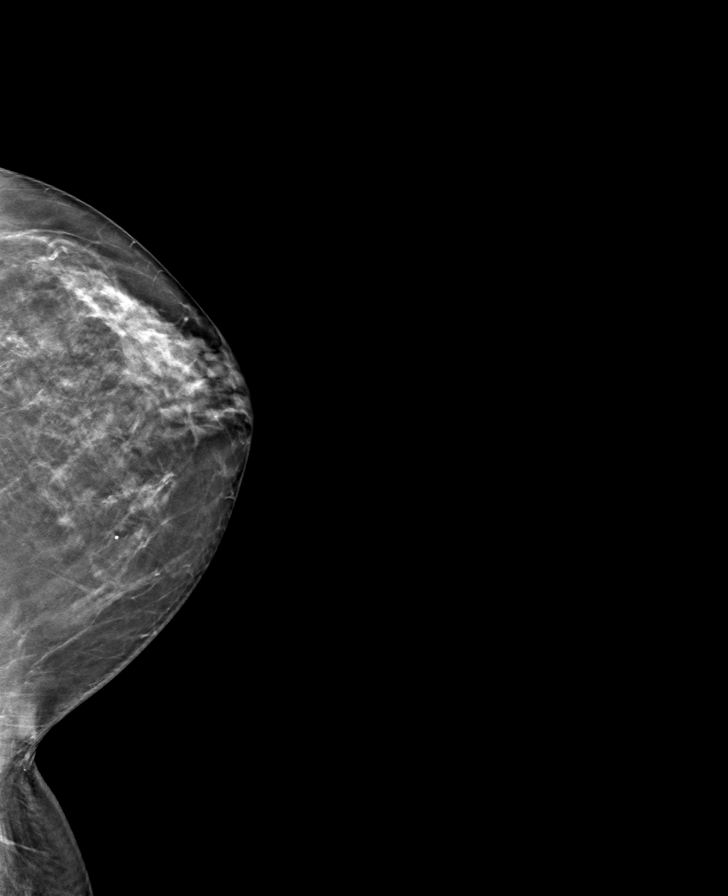

[R CC tomo · tomo slice 33/65.0]
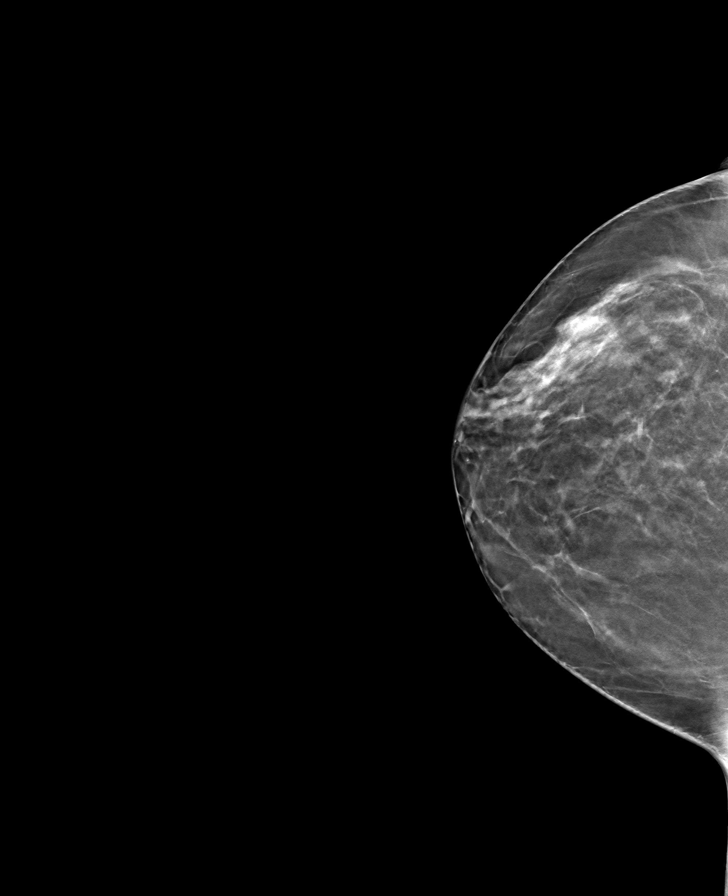

[L MLO tomo · tomo slice 34/67.0]
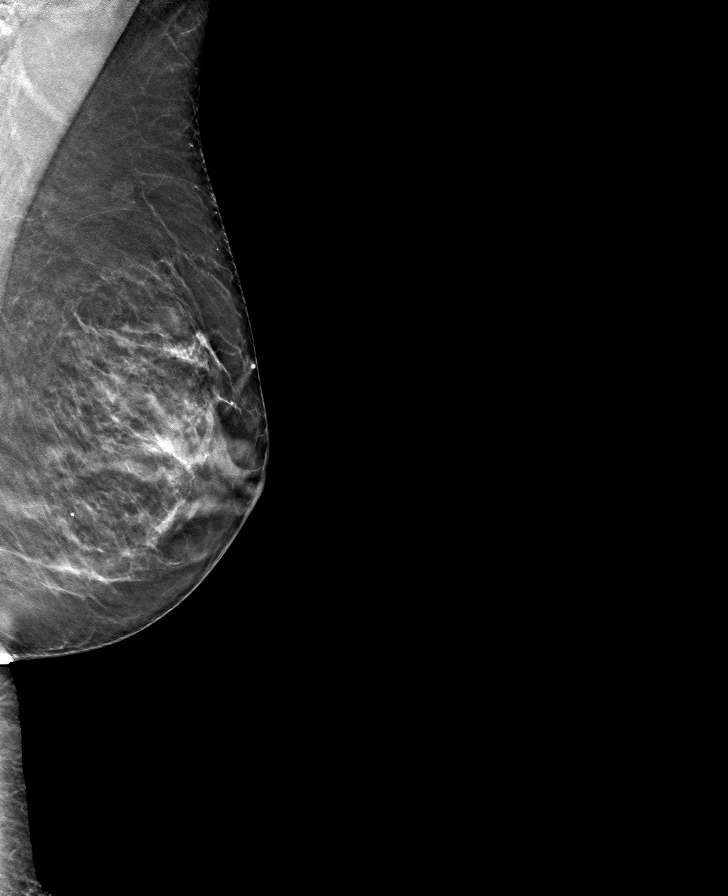

[8 of 24 positions shown; findings below may reference images not displayed]

ACR Breast Density Category c: The breast tissue is heterogeneously
dense, which may obscure small masses.
FINDINGS: There are no findings suspicious for malignancy.
IMPRESSION: No mammographic evidence of malignancy. A result letter of this
screening mammogram will be mailed directly to the patient.

RECOMMENDATION:
Screening mammogram in one year. (Code:[id number])

BI-RADS CATEGORY  1: Negative.

## 2023-10-18 ENCOUNTER — Ambulatory Visit
Admission: RE | Admit: 2023-10-18 | Discharge: 2023-10-18 | Disposition: A | Discharge status: Home / Self Care | Payer: PPO | Source: Ambulatory Visit | Attending: Family Medicine | Admitting: Family Medicine

## 2023-10-18 DIAGNOSIS — M8588 Other specified disorders of bone density and structure, other site: Secondary | ICD-10-CM | POA: Diagnosis not present

## 2023-10-18 DIAGNOSIS — N958 Other specified menopausal and perimenopausal disorders: Secondary | ICD-10-CM | POA: Diagnosis not present

## 2023-10-18 DIAGNOSIS — E2839 Other primary ovarian failure: Secondary | ICD-10-CM | POA: Diagnosis not present

## 2023-10-18 DIAGNOSIS — M810 Age-related osteoporosis without current pathological fracture: Secondary | ICD-10-CM

## 2023-10-27 ENCOUNTER — Ambulatory Visit: Payer: Self-pay | Admitting: Cardiology

## 2023-11-25 ENCOUNTER — Other Ambulatory Visit: Payer: Self-pay

## 2023-11-25 MED ORDER — DILTIAZEM HCL ER COATED BEADS 120 MG PO CP24: 120.0000 mg | ORAL_CAPSULE | Freq: Every day | ORAL | 3 refills | Status: DC

## 2023-11-26 ENCOUNTER — Ambulatory Visit: Payer: PPO | Attending: Cardiology | Admitting: Cardiology

## 2023-11-26 ENCOUNTER — Encounter: Payer: Self-pay | Admitting: Cardiology

## 2023-11-26 VITALS — BP 124/70 | HR 62 | Resp 16 | Ht 68.0 in | Wt 191.0 lb

## 2023-11-26 DIAGNOSIS — I10 Essential (primary) hypertension: Secondary | ICD-10-CM

## 2023-11-26 DIAGNOSIS — D6869 Other thrombophilia: Secondary | ICD-10-CM

## 2023-11-26 DIAGNOSIS — I480 Paroxysmal atrial fibrillation: Secondary | ICD-10-CM

## 2023-11-26 NOTE — Progress Notes (Signed)
 Cardiology Office Note:  .   Date:  11/27/2023  ID:  Winton Shank Sliter, DOB 13-Feb-1946, MRN [id number] PCP: Stephanie Charlene CROME, MD   [facility] Providers Cardiologist:  Gordy Bergamo, MD   History of Present Illness: .   Iram Astorino Kirkendall is a 78 y.o. Caucasian female patient with paroxysmal atrial fibrillation (first episode in March 2020 while visiting [place], [place]), another episode for just 10 minutes in 2022, patient wears an apple watch with EKG monitoring capacity and she monitors this on a daily basis. She has had occasional episodes of palpitation but very short lasting.  Otherwise remains asymptomatic.  She has had carotid artery duplex and echocardiogram in 2021 revealing mild carotid atherosclerosis and essentially normal echocardiogram with mild at most mild to moderate MR. She is accompanied by her husband today.  Discussed the use of AI scribe software for clinical note transcription with the patient, who gave verbal consent to proceed.  History of Present Illness   The patient, with a history of atrial fibrillation, is currently on Eliquis  and diazepam. She reports no side effects from these medications and her kidney function, blood count, and cholesterol levels are all normal. The patient's EKG is also normal. Several years ago, the patient was diagnosed with 1-39% stenosis in the carotid arteries, but no plaque was found. The patient also reports taking Miralax daily for bowel regularity.     Labs   External Labs: Care everywhere labs  Labs 08/03/2023:  Hb 14.2/HCT 45.1, platelets 254.  Normal indicis.  Serum glucose 90 mg, BUN 18, creatinine 0.64, EGFR 91 mL, potassium 4.4.  Total cholesterol 156, triglycerides 91, HDL 51, LDL 88.  Review of Systems  Cardiovascular:  Negative for chest pain, dyspnea on exertion and leg swelling.   Physical Exam:   VS:  BP 124/70 (BP Location: Left Arm, Patient Position: Sitting, Cuff Size: Normal)   Pulse 62    Resp 16   Ht 5' 8 (1.727 m)   Wt 191 lb (86.6 kg)   SpO2 97%   BMI 29.04 kg/m    Wt Readings from Last 3 Encounters:  11/26/23 191 lb (86.6 kg)  10/26/22 186 lb 11.2 oz (84.7 kg)  10/27/21 186 lb (84.4 kg)    Physical Exam Neck:     Vascular: No carotid bruit or JVD.  Cardiovascular:     Rate and Rhythm: Normal rate and regular rhythm.     Pulses: Intact distal pulses.     Heart sounds: Normal heart sounds. No murmur heard.    No gallop.  Pulmonary:     Effort: Pulmonary effort is normal.     Breath sounds: Normal breath sounds.  Abdominal:     General: Bowel sounds are normal.     Palpations: Abdomen is soft.  Musculoskeletal:     Right lower leg: No edema.     Left lower leg: No edema.    Studies Reviewed: .    Carotid artery duplex 07/12/2020: Right Carotid: Velocities in the right ICA are consistent with a 1-39% stenosis.  Left Carotid: Velocities in the left ICA are consistent with a 1-39% stenosis.  Vertebrals: Bilateral vertebral arteries demonstrate antegrade flow.  Subclavians: Normal flow hemodynamics were seen in bilateral subclavian arteries.  Images personally reviewed, there is no significant atherosclerotic plaque.  Suspect mild increased velocity is related to tortuosity of bilateral carotid arteries. EKG:    EKG Interpretation Date/Time:  Friday November 26 2023 15:53:26 EST Ventricular Rate:  57 PR Interval:  178 QRS Duration:  100 QT Interval:  448 QTC Calculation: 436 R Axis:   -11  Text Interpretation: EKG 11/26/2023: Sinus bradycardia at rate of 57 bpm, IVCD, borderline criteria for LVH.  Probably normal variant.  Compared to 10/26/2022, no significant change. Confirmed by Kolbey Teichert, Jagadeesh ([id number]) on 11/26/2023 4:09:44 PM    Medications and allergies    Allergies  Allergen Reactions   Darvon [Propoxyphene] Anaphylaxis   Codeine     UNSPECIFIED REACTION    Misc. Sulfonamide Containing Compounds    Sulfa Drugs Cross Reactors     UNSPECIFIED  REACTION    Bystolic [Nebivolol Hcl] Other (See Comments)    No energy and lightheadedness.   Diovan [Valsartan] Anxiety     Current Outpatient Medications:    acetaminophen  (TYLENOL ) 500 MG tablet, Take 500-1,000 mg by mouth every 8 (eight) hours as needed for mild pain or moderate pain., Disp: , Rfl:    alendronate  (FOSAMAX ) 70 MG/75ML solution, Take 70 mg by mouth every 7 (seven) days., Disp: , Rfl:    apixaban  (ELIQUIS ) 5 MG TABS tablet, Take 1 tablet (5 mg total) by mouth 2 (two) times daily., Disp: 180 tablet, Rfl: 4   Collagen 500 MG CAPS, Take 2 capsules (1,000 mg total) by mouth daily., Disp: 60 capsule, Rfl: 0   diltiazem  (CARDIZEM  CD) 120 MG 24 hr capsule, Take 1 capsule (120 mg total) by mouth daily., Disp: 90 capsule, Rfl: 3   ELDERBERRY PO, Take by mouth., Disp: , Rfl:    fluticasone  (FLONASE ) 50 MCG/ACT nasal spray, Place 2 sprays into both nostrils daily as needed for allergies. After sinus rinses, Disp: , Rfl:    lisinopril -hydrochlorothiazide  (ZESTORETIC ) 20-25 MG tablet, Take 1 tablet by mouth daily. OFFICE VISIT REQUIRED PRIOR TO ANY FURTHER REFILLS, Disp: 30 tablet, Rfl: 0   Magnesium  250 MG TABS, Take 1 tablet by mouth daily., Disp: , Rfl:    montelukast  (SINGULAIR ) 10 MG tablet, Take 10 mg by mouth daily., Disp: , Rfl:    pravastatin  (PRAVACHOL ) 20 MG tablet, Take 1 tablet (20 mg total) by mouth daily. OFFICE VISIT REQUIRED PRIOR TO ANY FURTHER REFILLS, Disp: 30 tablet, Rfl: 0   sertraline  (ZOLOFT ) 25 MG tablet, Take 1 tablet (25 mg total) by mouth daily. **PATIENT NEEDS APT FOR FURTHER REFILLS**, Disp: 30 tablet, Rfl: 0   ASSESSMENT AND PLAN: .      ICD-10-CM   1. PAF (paroxysmal atrial fibrillation) (HCC)  I48.0 EKG 12-Lead    2. Essential hypertension  I10     3. Secondary hypercoagulable state (HCC)  D68.69      Assessment and Plan    Paroxysmal Atrial Fibrillation Atrial fibrillation is managed with Eliquis  without side effects or bleeding issues. Kidney  function, blood count, and cholesterol levels are normal. EKG shows 57 bpm, IVCD, and borderline criteria for LVH, likely a normal variant with no significant change from the previous year. Continuing Eliquis  is important to prevent stroke. Continue Eliquis  and order an EKG.  Click Here to Calculate/Change CHADS2VASc Score The patient's CHADS2-VASc score is 4, indicating a 4.8% annual risk of stroke.  Therefore, anticoagulation is recommended.   CHF History: No HTN History: Yes Diabetes History: No Stroke History: No Vascular Disease History: No   Carotid Artery Stenosis Carotid ultrasound indicates 1-39% stenosis. Images personally reviewed, there is no significant atherosclerotic plaque.  Suspect mild increased velocity is related to tortuosity of bilateral carotid arteries.The congenital winding nature of the carotid artery is not a concern  and causes noise but is not problematic. No cholesterol medication is needed. Current therapy remains appropriate with no changes.  Hypertension Hypertension is well-controlled with medication. Maintaining the current medication regimen is important. Continue current blood pressure medication.  Constipation Chronic constipation is managed with daily Miralax, with no issues in bowel movements or bleeding stools. Miralax is preferred over Dulcolax and Colace to avoid dependency. Continuing Miralax is beneficial for regular bowel movements. Continue daily Miralax.  General Health Maintenance Overall health is stable. Regular follow-ups and monitoring are recommended.  As she is very stable from cardiac standpoint, maintaining sinus rhythm, on appropriately being treated with Eliquis  as per patient preference and also due to elevated CHA2DS2-VASc rescore as she is now 78 years of age, previously 3 years ago when she started Eliquis , her CHA2DS2-VASc was 3.  She is completely asymptomatic and leads a very active lifestyle and physical examination is normal.   Blood pressure also well-controlled on lisinopril  HCT.  External labs reviewed.  I will see her back on a as needed basis.  Signed,  Gordy Bergamo, MD, [facility] 11/27/2023, 11:06 AM [facility] [address] [place], [place] [address] Phone: [phone]. Fax:  [phone]

## 2023-11-26 NOTE — Patient Instructions (Signed)
 Medication Instructions:  The current medical regimen is effective;  continue present plan and medications.  *If you need a refill on your cardiac medications before your next appointment, please call your pharmacy*   Follow-Up: At [facility], you and your health needs are our priority.  As part of our continuing mission to provide you with exceptional heart care, we have created designated Provider Care Teams.  These Care Teams include your primary Cardiologist (physician) and Advanced Practice Providers (APPs -  Physician Assistants and Nurse Practitioners) who all work together to provide you with the care you need, when you need it.  We recommend signing up for the patient portal called MyChart.  Sign up information is provided on this After Visit Summary.  MyChart is used to connect with patients for Virtual Visits (Telemedicine).  Patients are able to view lab/test results, encounter notes, upcoming appointments, etc.  Non-urgent messages can be sent to your provider as well.   To learn more about what you can do with MyChart, go to [email or web address].    Your next appointment:   Follow up as needed with Dr Ladona

## 2023-12-28 ENCOUNTER — Other Ambulatory Visit: Payer: Self-pay | Admitting: Family Medicine

## 2023-12-28 DIAGNOSIS — E7800 Pure hypercholesterolemia, unspecified: Secondary | ICD-10-CM | POA: Diagnosis not present

## 2023-12-28 DIAGNOSIS — Z0000 Encounter for general adult medical examination without abnormal findings: Secondary | ICD-10-CM

## 2023-12-28 DIAGNOSIS — Z79899 Other long term (current) drug therapy: Secondary | ICD-10-CM | POA: Diagnosis not present

## 2023-12-29 LAB — LAB REPORT - SCANNED: EGFR: 89

## 2023-12-30 DIAGNOSIS — M810 Age-related osteoporosis without current pathological fracture: Secondary | ICD-10-CM | POA: Diagnosis not present

## 2023-12-30 DIAGNOSIS — I10 Essential (primary) hypertension: Secondary | ICD-10-CM | POA: Diagnosis not present

## 2023-12-30 DIAGNOSIS — E7800 Pure hypercholesterolemia, unspecified: Secondary | ICD-10-CM | POA: Diagnosis not present

## 2023-12-30 DIAGNOSIS — Z23 Encounter for immunization: Secondary | ICD-10-CM | POA: Diagnosis not present

## 2023-12-30 DIAGNOSIS — I480 Paroxysmal atrial fibrillation: Secondary | ICD-10-CM | POA: Diagnosis not present

## 2023-12-30 DIAGNOSIS — E663 Overweight: Secondary | ICD-10-CM | POA: Diagnosis not present

## 2023-12-30 DIAGNOSIS — F419 Anxiety disorder, unspecified: Secondary | ICD-10-CM | POA: Diagnosis not present

## 2023-12-30 DIAGNOSIS — D6869 Other thrombophilia: Secondary | ICD-10-CM | POA: Diagnosis not present

## 2023-12-30 DIAGNOSIS — Z9181 History of falling: Secondary | ICD-10-CM | POA: Diagnosis not present

## 2023-12-30 DIAGNOSIS — G4700 Insomnia, unspecified: Secondary | ICD-10-CM | POA: Diagnosis not present

## 2023-12-30 DIAGNOSIS — Z1331 Encounter for screening for depression: Secondary | ICD-10-CM | POA: Diagnosis not present

## 2024-01-17 ENCOUNTER — Ambulatory Visit
Admission: RE | Admit: 2024-01-17 | Discharge: 2024-01-17 | Disposition: A | Discharge status: Home / Self Care | Source: Ambulatory Visit | Attending: Family Medicine | Admitting: Family Medicine

## 2024-01-17 DIAGNOSIS — Z0000 Encounter for general adult medical examination without abnormal findings: Secondary | ICD-10-CM

## 2024-01-17 DIAGNOSIS — Z1231 Encounter for screening mammogram for malignant neoplasm of breast: Secondary | ICD-10-CM | POA: Diagnosis not present

## 2024-03-17 DIAGNOSIS — Z96652 Presence of left artificial knee joint: Secondary | ICD-10-CM | POA: Diagnosis not present

## 2024-05-01 DIAGNOSIS — Z9181 History of falling: Secondary | ICD-10-CM | POA: Diagnosis not present

## 2024-05-01 DIAGNOSIS — Z0000 Encounter for general adult medical examination without abnormal findings: Secondary | ICD-10-CM | POA: Diagnosis not present

## 2024-05-01 DIAGNOSIS — Z1331 Encounter for screening for depression: Secondary | ICD-10-CM | POA: Diagnosis not present

## 2024-07-04 DIAGNOSIS — E7800 Pure hypercholesterolemia, unspecified: Secondary | ICD-10-CM | POA: Diagnosis not present

## 2024-07-04 DIAGNOSIS — Z79899 Other long term (current) drug therapy: Secondary | ICD-10-CM | POA: Diagnosis not present

## 2024-07-19 DIAGNOSIS — Z139 Encounter for screening, unspecified: Secondary | ICD-10-CM | POA: Diagnosis not present

## 2024-07-19 DIAGNOSIS — I10 Essential (primary) hypertension: Secondary | ICD-10-CM | POA: Diagnosis not present

## 2024-07-19 DIAGNOSIS — M810 Age-related osteoporosis without current pathological fracture: Secondary | ICD-10-CM | POA: Diagnosis not present

## 2024-07-19 DIAGNOSIS — E7800 Pure hypercholesterolemia, unspecified: Secondary | ICD-10-CM | POA: Diagnosis not present

## 2024-07-19 DIAGNOSIS — D6869 Other thrombophilia: Secondary | ICD-10-CM | POA: Diagnosis not present

## 2024-07-19 DIAGNOSIS — G4700 Insomnia, unspecified: Secondary | ICD-10-CM | POA: Diagnosis not present

## 2024-07-19 DIAGNOSIS — F419 Anxiety disorder, unspecified: Secondary | ICD-10-CM | POA: Diagnosis not present

## 2024-07-19 DIAGNOSIS — I480 Paroxysmal atrial fibrillation: Secondary | ICD-10-CM | POA: Diagnosis not present

## 2024-11-09 ENCOUNTER — Other Ambulatory Visit: Payer: Self-pay | Admitting: Cardiology

## 2024-11-23 ENCOUNTER — Telehealth: Payer: Self-pay | Admitting: Cardiology

## 2024-11-23 NOTE — Telephone Encounter (Signed)
 Called pt in regards to AF.  Reports takes diltiazem  at bedtime; took last night around 11.  Around 11:30 pm AF started so took an additional dose. Stayed up all night very anxious which causes diarrhea for her at times.  AF stopped around 6 am this morning.  Pt wants to know if there is anything else she could've done to help with AF.  Pt denies recent illness.  Did report had almost a full pot of coffee yesterday; doesn't normally drink this much.  Advised pt this could have contributed to AF episode, advised not to drink that much coffee.  Pt denies CP or signs of heart attack.  Advised pt of ED precautions.  Rescheduled OV from April to 11/27/24 at 8 am with Dr. Ladona.

## 2024-11-23 NOTE — Telephone Encounter (Signed)
 Patient c/o Palpitations:  STAT if patient reporting lightheadedness, shortness of breath, or chest pain  How long have you had palpitations/irregular HR/ Afib? Are you having the symptoms now? Experienced A Fib 02/04 at 11:00pm and went back into rhythm this morning 02/05 at 6:00am  Are you currently experiencing lightheadedness, SOB or CP? No  Do you have a history of afib (atrial fibrillation) or irregular heart rhythm? Yes  Have you checked your BP or HR? (document readings if available): HR- 130, 73  Are you experiencing any other symptoms? Diarrhea

## 2024-11-27 ENCOUNTER — Ambulatory Visit: Admitting: Cardiology

## 2025-02-02 ENCOUNTER — Ambulatory Visit: Admitting: Cardiology
